# Patient Record
Sex: Female | Born: 1950 | Race: White | Hispanic: No | Marital: Married | Smoking: Former smoker
Health system: Southern US, Community
[De-identification: ages and names within clinical notes are randomized; demographics above are authoritative.]

## PROBLEM LIST (undated history)

## (undated) DIAGNOSIS — D496 Neoplasm of unspecified behavior of brain: Secondary | ICD-10-CM

## (undated) DIAGNOSIS — E079 Disorder of thyroid, unspecified: Secondary | ICD-10-CM

## (undated) DIAGNOSIS — D649 Anemia, unspecified: Secondary | ICD-10-CM

## (undated) DIAGNOSIS — F909 Attention-deficit hyperactivity disorder, unspecified type: Secondary | ICD-10-CM

## (undated) DIAGNOSIS — Z91018 Allergy to other foods: Secondary | ICD-10-CM

## (undated) DIAGNOSIS — E039 Hypothyroidism, unspecified: Secondary | ICD-10-CM

## (undated) DIAGNOSIS — K829 Disease of gallbladder, unspecified: Secondary | ICD-10-CM

## (undated) DIAGNOSIS — N62 Hypertrophy of breast: Secondary | ICD-10-CM

## (undated) DIAGNOSIS — Z98811 Dental restoration status: Secondary | ICD-10-CM

## (undated) DIAGNOSIS — M069 Rheumatoid arthritis, unspecified: Secondary | ICD-10-CM

## (undated) DIAGNOSIS — T7840XA Allergy, unspecified, initial encounter: Secondary | ICD-10-CM

## (undated) DIAGNOSIS — F32A Depression, unspecified: Secondary | ICD-10-CM

## (undated) DIAGNOSIS — E559 Vitamin D deficiency, unspecified: Secondary | ICD-10-CM

## (undated) HISTORY — DX: Allergy to other foods: Z91.018

## (undated) HISTORY — DX: Disease of gallbladder, unspecified: K82.9

## (undated) HISTORY — DX: Attention-deficit hyperactivity disorder, unspecified type: F90.9

## (undated) HISTORY — PX: FOOT SURGERY: SHX648

## (undated) HISTORY — PX: APPENDECTOMY: SHX54

## (undated) HISTORY — DX: Neoplasm of unspecified behavior of brain: D49.6

## (undated) HISTORY — DX: Allergy, unspecified, initial encounter: T78.40XA

## (undated) HISTORY — DX: Vitamin D deficiency, unspecified: E55.9

## (undated) HISTORY — PX: ABDOMINAL HYSTERECTOMY: SHX81

## (undated) HISTORY — DX: Anemia, unspecified: D64.9

## (undated) HISTORY — PX: TONSILLECTOMY: SUR1361

## (undated) HISTORY — DX: Disorder of thyroid, unspecified: E07.9

## (undated) HISTORY — PX: HERNIA REPAIR: SHX51

## (undated) HISTORY — DX: Depression, unspecified: F32.A

## (undated) HISTORY — PX: KNEE SURGERY: SHX244

---

## 1999-05-01 HISTORY — PX: ANTERIOR AND POSTERIOR REPAIR: SHX1172

## 2002-04-30 HISTORY — PX: BRAIN MENINGIOMA EXCISION: SHX576

## 2003-05-01 DIAGNOSIS — D496 Neoplasm of unspecified behavior of brain: Secondary | ICD-10-CM

## 2003-05-01 HISTORY — DX: Neoplasm of unspecified behavior of brain: D49.6

## 2003-11-29 ENCOUNTER — Encounter: Admission: RE | Admit: 2003-11-29 | Discharge: 2003-11-29 | Payer: Self-pay | Admitting: Family Medicine

## 2003-12-04 ENCOUNTER — Encounter: Admission: RE | Admit: 2003-12-04 | Discharge: 2003-12-04 | Payer: Self-pay | Admitting: Family Medicine

## 2003-12-06 ENCOUNTER — Encounter: Admission: RE | Admit: 2003-12-06 | Discharge: 2003-12-06 | Payer: Self-pay | Admitting: Family Medicine

## 2003-12-19 ENCOUNTER — Emergency Department (HOSPITAL_COMMUNITY): Admission: EM | Admit: 2003-12-19 | Discharge: 2003-12-19 | Payer: Self-pay | Admitting: Emergency Medicine

## 2004-05-16 ENCOUNTER — Other Ambulatory Visit: Admission: RE | Admit: 2004-05-16 | Discharge: 2004-05-16 | Payer: Self-pay | Admitting: Internal Medicine

## 2004-05-26 ENCOUNTER — Ambulatory Visit (HOSPITAL_COMMUNITY): Admission: RE | Admit: 2004-05-26 | Discharge: 2004-05-26 | Payer: Self-pay | Admitting: Internal Medicine

## 2005-06-27 ENCOUNTER — Ambulatory Visit (HOSPITAL_COMMUNITY): Admission: RE | Admit: 2005-06-27 | Discharge: 2005-06-27 | Payer: Self-pay | Admitting: Internal Medicine

## 2005-07-16 ENCOUNTER — Encounter: Admission: RE | Admit: 2005-07-16 | Discharge: 2005-07-16 | Payer: Self-pay | Admitting: Internal Medicine

## 2005-07-25 ENCOUNTER — Encounter: Admission: RE | Admit: 2005-07-25 | Discharge: 2005-07-25 | Payer: Self-pay | Admitting: Internal Medicine

## 2006-08-28 ENCOUNTER — Ambulatory Visit (HOSPITAL_COMMUNITY): Admission: RE | Admit: 2006-08-28 | Discharge: 2006-08-28 | Payer: Self-pay | Admitting: *Deleted

## 2007-07-16 ENCOUNTER — Encounter: Admission: RE | Admit: 2007-07-16 | Discharge: 2007-10-14 | Payer: Self-pay | Admitting: Family Medicine

## 2009-02-04 ENCOUNTER — Encounter: Admission: RE | Admit: 2009-02-04 | Discharge: 2009-02-04 | Payer: Self-pay | Admitting: *Deleted

## 2009-09-16 ENCOUNTER — Encounter: Admission: RE | Admit: 2009-09-16 | Discharge: 2009-09-16 | Payer: Self-pay | Admitting: *Deleted

## 2010-03-24 ENCOUNTER — Encounter: Admission: RE | Admit: 2010-03-24 | Discharge: 2010-03-24 | Payer: Self-pay | Admitting: *Deleted

## 2010-05-21 ENCOUNTER — Encounter: Payer: Self-pay | Admitting: Internal Medicine

## 2010-11-02 ENCOUNTER — Ambulatory Visit: Payer: Self-pay

## 2010-11-02 ENCOUNTER — Other Ambulatory Visit: Payer: Self-pay | Admitting: Occupational Medicine

## 2010-11-02 DIAGNOSIS — R52 Pain, unspecified: Secondary | ICD-10-CM

## 2011-02-06 ENCOUNTER — Other Ambulatory Visit: Payer: Self-pay | Admitting: Family Medicine

## 2011-02-06 DIAGNOSIS — N63 Unspecified lump in unspecified breast: Secondary | ICD-10-CM

## 2011-03-27 ENCOUNTER — Ambulatory Visit
Admission: RE | Admit: 2011-03-27 | Discharge: 2011-03-27 | Disposition: A | Discharge status: Home / Self Care | Payer: Private Health Insurance - Indemnity | Source: Ambulatory Visit | Attending: Family Medicine | Admitting: Family Medicine

## 2011-03-27 ENCOUNTER — Other Ambulatory Visit: Payer: Self-pay | Admitting: Family Medicine

## 2011-03-27 DIAGNOSIS — N63 Unspecified lump in unspecified breast: Secondary | ICD-10-CM

## 2011-05-12 ENCOUNTER — Emergency Department (INDEPENDENT_AMBULATORY_CARE_PROVIDER_SITE_OTHER): Payer: Private Health Insurance - Indemnity

## 2011-05-12 ENCOUNTER — Encounter (HOSPITAL_BASED_OUTPATIENT_CLINIC_OR_DEPARTMENT_OTHER): Payer: Self-pay | Admitting: *Deleted

## 2011-05-12 ENCOUNTER — Emergency Department (HOSPITAL_BASED_OUTPATIENT_CLINIC_OR_DEPARTMENT_OTHER)
Admission: EM | Admit: 2011-05-12 | Discharge: 2011-05-12 | Disposition: A | Discharge status: Home / Self Care | Payer: Private Health Insurance - Indemnity | Attending: Emergency Medicine | Admitting: Emergency Medicine

## 2011-05-12 DIAGNOSIS — M25559 Pain in unspecified hip: Secondary | ICD-10-CM

## 2011-05-12 DIAGNOSIS — W19XXXA Unspecified fall, initial encounter: Secondary | ICD-10-CM

## 2011-05-12 DIAGNOSIS — Z79899 Other long term (current) drug therapy: Secondary | ICD-10-CM | POA: Insufficient documentation

## 2011-05-12 DIAGNOSIS — S79929A Unspecified injury of unspecified thigh, initial encounter: Secondary | ICD-10-CM

## 2011-05-12 DIAGNOSIS — S7000XA Contusion of unspecified hip, initial encounter: Secondary | ICD-10-CM

## 2011-05-12 DIAGNOSIS — Y9229 Other specified public building as the place of occurrence of the external cause: Secondary | ICD-10-CM | POA: Insufficient documentation

## 2011-05-12 HISTORY — DX: Rheumatoid arthritis, unspecified: M06.9

## 2011-05-12 MED ORDER — HYDROCODONE-ACETAMINOPHEN 5-325 MG PO TABS: 2.0000 | ORAL_TABLET | ORAL | Status: AC | PRN

## 2011-05-12 NOTE — ED Provider Notes (Signed)
History  This chart was scribed for Doug Sou, MD by Bennett Scrape. This patient was seen in room MH07/MH07 and the patient's care was started at 8:16PM.  CSN: [id number]  Arrival date & time 05/12/11  7829   First MD Initiated Contact with Patient 05/12/11 2008      Chief Complaint  Patient presents with  . Hip Pain     Patient is a 61 y.o. female presenting with fall. The history is provided by the patient. No language interpreter was used.  Fall The accident occurred 6 to 12 hours ago. The fall occurred while walking. She landed on a hard floor. There was no blood loss. The point of impact was the left hip. The pain is present in the left hip. She was ambulatory at the scene. There was no entrapment after the fall. There was no alcohol use involved in the accident. Pertinent negatives include no fever, no nausea, no vomiting and no loss of consciousness. The symptoms are aggravated by activity.    Deborah Petersen is a 61 y.o. female who presents to the Emergency Department complaining of a slip and fall while she was walking in a [employer] around 11 o'clock this morning. She states that her right leg went under her and she fell onto left hip. She denies a head injury or LOC. She was ambulatory after the event. She c/o swelling and pain of the left hip and right wrist pain. She states that movement aggravates symptoms. She did not take any medications to improve symptoms. She states that she had a brain tumor removed in 2005 but reports that her neither walking or coordination have not been affected by it. She denies smoking and alcohol use.  Past Medical History  Diagnosis Date  . Rheumatoid arthritis   inactive for several years  Past Surgical History  Procedure Date  . Abdominal hysterectomy   . Back surgery   . Appendectomy   . Tonsillectomy   . Knee surgery   . Foot surgery     History reviewed. No pertinent family history.  History  Substance Use Topics  .  Smoking status: Never Smoker   . Smokeless tobacco: Not on file  . Alcohol Use: No    OB History    Grav Para Term Preterm Abortions TAB SAB Ect Mult Living                  Review of Systems  Constitutional: Negative for fever and chills.  HENT: Negative for congestion and sore throat.   Respiratory: Negative for cough and shortness of breath.   Cardiovascular: Negative for chest pain.  Gastrointestinal: Negative for nausea, vomiting and diarrhea.  Musculoskeletal: Positive for joint swelling (Left hip swelling and pain).  Skin: Negative for rash.  Neurological: Negative for loss of consciousness.  All other systems reviewed and are negative.    Allergies  Erythromycin  Home Medications   Current Outpatient Rx  Name Route Sig Dispense Refill  . ASPIRIN 81 MG PO TABS Oral Take 160 mg by mouth daily.    Marland Kitchen BILBERRY (VACCINIUM MYRTILLUS) PO Oral Take 1 capsule by mouth daily.    Marland Kitchen VITAMIN D-3 5000 UNITS PO TABS Oral Take 1 tablet by mouth daily.    . COD LIVER OIL PO Oral Take 1 capsule by mouth daily.    Marland Kitchen LEVOTHYROXINE SODIUM 112 MCG PO TABS Oral Take 112 mcg by mouth daily.    . ADULT MULTIVITAMIN W/MINERALS CH Oral Take 1  tablet by mouth daily.      Triage Vitals: BP 148/100  Pulse 98  Temp(Src) 98.4 F (36.9 C) (Oral)  Resp 20  Ht 5\' 3"  (1.6 m)  Wt 171 lb (77.565 kg)  BMI 30.29 kg/m2  SpO2 98%  Physical Exam  Nursing note and vitals reviewed. Constitutional: She is oriented to person, place, and time. She appears well-developed and well-nourished.  HENT:  Head: Normocephalic and atraumatic.  Eyes: Conjunctivae and EOM are normal.  Neck: Normal range of motion. Neck supple.  Cardiovascular: Normal rate, regular rhythm and normal heart sounds.   Pulmonary/Chest: Effort normal. No respiratory distress.  Abdominal: She exhibits no distension. There is no tenderness.  Musculoskeletal: Normal range of motion. She exhibits no edema.       Spine is non-tender,  ecchymotic area to the lateral aspect of the left hip; no pain upon internal rotation of the left thigh; ecchymotic area on the dorsal aspect of the left elbow, neurovascularly intact, right wrist is minimally tender; no swelling, no snuff box tenderness; limp favoring the left leg  Neurological: She is alert and oriented to person, place, and time. No cranial nerve deficit.  Skin: Skin is warm and dry.  Psychiatric: She has a normal mood and affect. Her behavior is normal.    ED Course  Procedures (including critical care time)  DIAGNOSTIC STUDIES: Oxygen Saturation is 98% on room air, normal by my interpretation.    COORDINATION OF CARE: 8:22PM-Discussed treatment plan with pt at bedside and pt agreed to plan. Pt turned down pain medications.  Labs Reviewed - No data to display  Dg Hip Complete Left  05/12/2011  *RADIOLOGY REPORT*  Clinical Data: Trauma, pain and bruise upper left thigh  LEFT HIP - COMPLETE 2+ VIEW  Comparison: None  Findings: Symmetric hip and SI joints. Question osseous demineralization. No acute fracture, dislocation, or bone destruction. Numerous pelvic phleboliths. Extensive soft tissue swelling/hematoma lateral aspect left hip region.  IMPRESSION: No acute osseous abnormalities.  Original Report Authenticated By: Lollie Marrow, M.D.   Ct Hip Left Wo Contrast  05/12/2011  *RADIOLOGY REPORT*  Clinical Data: Left hip pain post fall, hematoma  CT OF THE LEFT HIP WITHOUT CONTRAST  Technique:  Multidetector CT imaging was performed according to the standard protocol. Multiplanar CT image reconstructions were also generated.  Comparison: Radiographs 05/12/2011  Findings: Large soft tissue hematoma lateral to left hip and proximal left femur. Osseous mineralization normal. Left hip and SI joints normal appearance. No left pelvic or proximal left femoral fracture identified.  IMPRESSION: No fractures identified.  Original Report Authenticated By: Lollie Marrow, M.D.     No  diagnosis found.  Pt repeatedly declines pain medications  MDM  X-rays of right wrist and left elbow not indicated discussed with patient who agrees Plan prescription hydrocodone-A. Pap Followup with primary care Dr. at [facility] and that if significant pain in for 5 days Diagnosis #1 fall #2 contusions to multiple sites   Medical screening examination/treatment/procedure(s) were performed by non-physician practitioner and as supervising physician I was immediately available for consultation/collaboration.     Doug Sou, MD 05/12/11 2159

## 2011-05-12 NOTE — ED Provider Notes (Signed)
History     CSN: [id number]  Arrival date & time 05/12/11  0865   First MD Initiated Contact with Patient 05/12/11 2008      Chief Complaint  Patient presents with  . Hip Pain    (Consider location/radiation/quality/duration/timing/severity/associated sxs/prior treatment) HPI  Past Medical History  Diagnosis Date  . Rheumatoid arthritis     Past Surgical History  Procedure Date  . Abdominal hysterectomy   . Back surgery   . Appendectomy   . Tonsillectomy   . Knee surgery   . Foot surgery     History reviewed. No pertinent family history.  History  Substance Use Topics  . Smoking status: Never Smoker   . Smokeless tobacco: Not on file  . Alcohol Use: No    OB History    Grav Para Term Preterm Abortions TAB SAB Ect Mult Living                  Review of Systems  Allergies  Erythromycin  Home Medications   Current Outpatient Rx  Name Route Sig Dispense Refill  . ASPIRIN 81 MG PO TABS Oral Take 160 mg by mouth daily.    Marland Kitchen BILBERRY (VACCINIUM MYRTILLUS) PO Oral Take 1 capsule by mouth daily.    Marland Kitchen VITAMIN D-3 5000 UNITS PO TABS Oral Take 1 tablet by mouth daily.    . COD LIVER OIL PO Oral Take 1 capsule by mouth daily.    Marland Kitchen LEVOTHYROXINE SODIUM 112 MCG PO TABS Oral Take 112 mcg by mouth daily.    . ADULT MULTIVITAMIN W/MINERALS CH Oral Take 1 tablet by mouth daily.      BP 148/100  Pulse 98  Temp(Src) 98.4 F (36.9 C) (Oral)  Resp 20  Ht 5\' 3"  (1.6 m)  Wt 171 lb (77.565 kg)  BMI 30.29 kg/m2  SpO2 98%  Physical Exam  ED Course  Procedures (including critical care time)  Labs Reviewed - No data to display No results found.   No diagnosis found.    MDM  Duplicate note        Doug Sou, MD 05/13/11 [phone]

## 2011-05-12 NOTE — ED Notes (Signed)
Pt states she fell at [employer] this a.m. And injured her left hip. Now c/o pain and swelling to same.

## 2011-10-29 DIAGNOSIS — N62 Hypertrophy of breast: Secondary | ICD-10-CM | POA: Insufficient documentation

## 2011-10-29 HISTORY — DX: Hypertrophy of breast: N62

## 2011-11-12 ENCOUNTER — Encounter (HOSPITAL_BASED_OUTPATIENT_CLINIC_OR_DEPARTMENT_OTHER): Payer: Self-pay | Admitting: *Deleted

## 2011-11-19 ENCOUNTER — Encounter (HOSPITAL_BASED_OUTPATIENT_CLINIC_OR_DEPARTMENT_OTHER)
Admission: RE | Disposition: A | Discharge status: Home / Self Care | Payer: Self-pay | Source: Ambulatory Visit | Attending: Specialist

## 2011-11-19 ENCOUNTER — Encounter (HOSPITAL_BASED_OUTPATIENT_CLINIC_OR_DEPARTMENT_OTHER): Payer: Self-pay | Admitting: Anesthesiology

## 2011-11-19 ENCOUNTER — Ambulatory Visit (HOSPITAL_BASED_OUTPATIENT_CLINIC_OR_DEPARTMENT_OTHER): Payer: Private Health Insurance - Indemnity | Admitting: Anesthesiology

## 2011-11-19 ENCOUNTER — Encounter (HOSPITAL_BASED_OUTPATIENT_CLINIC_OR_DEPARTMENT_OTHER): Payer: Self-pay | Admitting: *Deleted

## 2011-11-19 ENCOUNTER — Ambulatory Visit (HOSPITAL_BASED_OUTPATIENT_CLINIC_OR_DEPARTMENT_OTHER)
Admission: RE | Admit: 2011-11-19 | Discharge: 2011-11-20 | Disposition: A | Discharge status: Home / Self Care | Payer: Private Health Insurance - Indemnity | Source: Ambulatory Visit | Attending: Specialist | Admitting: Specialist

## 2011-11-19 DIAGNOSIS — M549 Dorsalgia, unspecified: Secondary | ICD-10-CM | POA: Insufficient documentation

## 2011-11-19 DIAGNOSIS — M25519 Pain in unspecified shoulder: Secondary | ICD-10-CM | POA: Insufficient documentation

## 2011-11-19 DIAGNOSIS — N62 Hypertrophy of breast: Secondary | ICD-10-CM | POA: Insufficient documentation

## 2011-11-19 HISTORY — DX: Dental restoration status: Z98.811

## 2011-11-19 HISTORY — PX: BREAST REDUCTION SURGERY: SHX8

## 2011-11-19 HISTORY — DX: Hypertrophy of breast: N62

## 2011-11-19 HISTORY — DX: Hypothyroidism, unspecified: E03.9

## 2011-11-19 SURGERY — MAMMOPLASTY, REDUCTION
Anesthesia: General | Site: Breast | Laterality: Bilateral | Wound class: Clean

## 2011-11-19 MED ORDER — SODIUM CHLORIDE 0.9 % IV SOLN
INTRAVENOUS | Status: DC | PRN
  Administered 2011-11-19: 400 mL via INTRAMUSCULAR

## 2011-11-19 MED ORDER — CEFAZOLIN SODIUM-DEXTROSE 2-3 GM-% IV SOLR: 2.0000 g | INTRAVENOUS | Status: DC

## 2011-11-19 MED ORDER — OXYCODONE-ACETAMINOPHEN 5-325 MG PO TABS: 1.0000 | ORAL_TABLET | ORAL | Status: DC | PRN

## 2011-11-19 MED ORDER — OXYCODONE HCL 5 MG/5ML PO SOLN: 5.0000 mg | Freq: Once | ORAL | Status: AC | PRN

## 2011-11-19 MED ORDER — DIPHENHYDRAMINE HCL 50 MG/ML IJ SOLN: 12.5000 mg | Freq: Four times a day (QID) | INTRAMUSCULAR | Status: DC | PRN

## 2011-11-19 MED ORDER — ONDANSETRON HCL 4 MG/2ML IJ SOLN: 4.0000 mg | Freq: Four times a day (QID) | INTRAMUSCULAR | Status: DC | PRN

## 2011-11-19 MED ORDER — EPHEDRINE SULFATE 50 MG/ML IJ SOLN
INTRAMUSCULAR | Status: DC | PRN
  Administered 2011-11-19: 10 mg via INTRAVENOUS

## 2011-11-19 MED ORDER — DROPERIDOL 2.5 MG/ML IJ SOLN
INTRAMUSCULAR | Status: DC | PRN
  Administered 2011-11-19: 0.625 mg via INTRAVENOUS

## 2011-11-19 MED ORDER — LACTATED RINGERS IV SOLN: 125.0000 mL | INTRAVENOUS | Status: AC

## 2011-11-19 MED ORDER — DIPHENHYDRAMINE HCL 12.5 MG/5ML PO ELIX: 12.5000 mg | ORAL_SOLUTION | Freq: Four times a day (QID) | ORAL | Status: DC | PRN

## 2011-11-19 MED ORDER — MORPHINE SULFATE (PF) 1 MG/ML IV SOLN: INTRAVENOUS | Status: DC

## 2011-11-19 MED ORDER — MORPHINE SULFATE (PF) 1 MG/ML IV SOLN
INTRAVENOUS | Status: DC
Start: 2011-11-19 — End: 2011-11-20

## 2011-11-19 MED ORDER — SODIUM CHLORIDE 0.9 % IJ SOLN: 9.0000 mL | INTRAMUSCULAR | Status: DC | PRN

## 2011-11-19 MED ORDER — SUCCINYLCHOLINE CHLORIDE 20 MG/ML IJ SOLN
INTRAMUSCULAR | Status: DC | PRN
  Administered 2011-11-19: 100 mg via INTRAVENOUS

## 2011-11-19 MED ORDER — FENTANYL CITRATE 0.05 MG/ML IJ SOLN
INTRAMUSCULAR | Status: DC | PRN
  Administered 2011-11-19: 25 ug via INTRAVENOUS
  Administered 2011-11-19: 100 ug via INTRAVENOUS

## 2011-11-19 MED ORDER — OXYCODONE HCL 5 MG PO TABS: 5.0000 mg | ORAL_TABLET | Freq: Once | ORAL | Status: AC | PRN

## 2011-11-19 MED ORDER — ACETAMINOPHEN 10 MG/ML IV SOLN: 1000.0000 mg | Freq: Once | INTRAVENOUS | Status: DC

## 2011-11-19 MED ORDER — CEFAZOLIN SODIUM-DEXTROSE 2-3 GM-% IV SOLR
2.0000 g | INTRAVENOUS | Status: AC
  Administered 2011-11-19: 2 g via INTRAVENOUS

## 2011-11-19 MED ORDER — LACTATED RINGERS IV SOLN
INTRAVENOUS | Status: DC
  Administered 2011-11-19 (×3): via INTRAVENOUS

## 2011-11-19 MED ORDER — LEVOTHYROXINE SODIUM 112 MCG PO TABS: 112.0000 ug | ORAL_TABLET | Freq: Every day | ORAL | Status: DC

## 2011-11-19 MED ORDER — METOCLOPRAMIDE HCL 5 MG/ML IJ SOLN: 10.0000 mg | Freq: Once | INTRAMUSCULAR | Status: AC | PRN

## 2011-11-19 MED ORDER — PROPOFOL 10 MG/ML IV EMUL
INTRAVENOUS | Status: DC | PRN
  Administered 2011-11-19: 200 mg via INTRAVENOUS

## 2011-11-19 MED ORDER — HYDROMORPHONE HCL PF 1 MG/ML IJ SOLN: 0.2500 mg | INTRAMUSCULAR | Status: DC | PRN

## 2011-11-19 MED ORDER — OXYCODONE-ACETAMINOPHEN 2.5-325 MG PO TABS: 1.0000 | ORAL_TABLET | ORAL | Status: AC | PRN

## 2011-11-19 MED ORDER — LIDOCAINE-EPINEPHRINE 0.5 %-1:200000 IJ SOLN
INTRAMUSCULAR | Status: DC | PRN
  Administered 2011-11-19: 100 mL

## 2011-11-19 MED ORDER — NALOXONE HCL 0.4 MG/ML IJ SOLN: 0.4000 mg | INTRAMUSCULAR | Status: DC | PRN

## 2011-11-19 MED ORDER — DEXAMETHASONE SODIUM PHOSPHATE 4 MG/ML IJ SOLN
INTRAMUSCULAR | Status: DC | PRN
  Administered 2011-11-19: 10 mg via INTRAVENOUS

## 2011-11-19 MED ORDER — LIDOCAINE HCL (CARDIAC) 20 MG/ML IV SOLN
INTRAVENOUS | Status: DC | PRN
  Administered 2011-11-19: 100 mg via INTRAVENOUS

## 2011-11-19 MED ORDER — MIDAZOLAM HCL 5 MG/5ML IJ SOLN
INTRAMUSCULAR | Status: DC | PRN
  Administered 2011-11-19: 2 mg via INTRAVENOUS

## 2011-11-19 MED ORDER — LACTATED RINGERS IV SOLN
INTRAVENOUS | Status: DC
  Administered 2011-11-19: 12:00:00 via INTRAVENOUS

## 2011-11-19 MED ORDER — ONDANSETRON HCL 4 MG/2ML IJ SOLN
INTRAMUSCULAR | Status: DC | PRN
  Administered 2011-11-19: 4 mg via INTRAVENOUS

## 2011-11-19 MED ORDER — CEFAZOLIN SODIUM-DEXTROSE 2-3 GM-% IV SOLR
2.0000 g | Freq: Three times a day (TID) | INTRAVENOUS | Status: DC
  Administered 2011-11-19 – 2011-11-20 (×2): 2 g via INTRAVENOUS

## 2011-11-19 SURGICAL SUPPLY — 59 items
APL SKNCLS STERI-STRIP NONHPOA (GAUZE/BANDAGES/DRESSINGS) ×2
BAG DECANTER FOR FLEXI CONT (MISCELLANEOUS) ×2 IMPLANT
BENZOIN TINCTURE PRP APPL 2/3 (GAUZE/BANDAGES/DRESSINGS) ×4 IMPLANT
BLADE KNIFE  20 PERSONNA (BLADE) ×2
BLADE KNIFE 20 PERSONNA (BLADE) ×2 IMPLANT
BLADE KNIFE PERSONA 10 (BLADE) ×2 IMPLANT
BLADE KNIFE PERSONA 15 (BLADE) ×2 IMPLANT
CANISTER SUCTION 1200CC (MISCELLANEOUS) ×2 IMPLANT
CLOTH BEACON ORANGE TIMEOUT ST (SAFETY) ×2 IMPLANT
COVER MAYO STAND STRL (DRAPES) ×2 IMPLANT
COVER TABLE BACK 60X90 (DRAPES) ×2 IMPLANT
DECANTER SPIKE VIAL GLASS SM (MISCELLANEOUS) ×4 IMPLANT
DRAIN CHANNEL 10F 3/8 F FF (DRAIN) ×4 IMPLANT
DRAPE LAPAROSCOPIC ABDOMINAL (DRAPES) ×2 IMPLANT
DRAPE UTILITY XL STRL (DRAPES) ×2 IMPLANT
DRSG PAD ABDOMINAL 8X10 ST (GAUZE/BANDAGES/DRESSINGS) ×8 IMPLANT
ELECT REM PT RETURN 9FT ADLT (ELECTROSURGICAL) ×2
ELECTRODE REM PT RTRN 9FT ADLT (ELECTROSURGICAL) ×1 IMPLANT
EVACUATOR SILICONE 100CC (DRAIN) ×4 IMPLANT
FILTER 7/8 IN (FILTER) IMPLANT
GAUZE XEROFORM 5X9 LF (GAUZE/BANDAGES/DRESSINGS) ×4 IMPLANT
GLOVE BIO SURGEON STRL SZ 6.5 (GLOVE) ×2 IMPLANT
GLOVE BIOGEL M STRL SZ7.5 (GLOVE) ×2 IMPLANT
GLOVE BIOGEL PI IND STRL 8 (GLOVE) ×1 IMPLANT
GLOVE BIOGEL PI INDICATOR 8 (GLOVE) ×1
GLOVE ECLIPSE 7.0 STRL STRAW (GLOVE) ×2 IMPLANT
GOWN PREVENTION PLUS XXLARGE (GOWN DISPOSABLE) ×4 IMPLANT
IV NS 500ML (IV SOLUTION) ×2
IV NS 500ML BAXH (IV SOLUTION) ×1 IMPLANT
NDL SPNL 18GX3.5 QUINCKE PK (NEEDLE) ×1 IMPLANT
NEEDLE SPNL 18GX3.5 QUINCKE PK (NEEDLE) ×2 IMPLANT
NS IRRIG 1000ML POUR BTL (IV SOLUTION) IMPLANT
PACK BASIN DAY SURGERY FS (CUSTOM PROCEDURE TRAY) ×2 IMPLANT
PEN SKIN MARKING BROAD TIP (MISCELLANEOUS) ×2 IMPLANT
PILLOW FOAM RUBBER ADULT (PILLOWS) ×2 IMPLANT
PIN SAFETY STERILE (MISCELLANEOUS) ×2 IMPLANT
SHEETING SILICONE GEL EPI DERM (MISCELLANEOUS) IMPLANT
SLEEVE SCD COMPRESS KNEE MED (MISCELLANEOUS) ×2 IMPLANT
SPECIMEN JAR MEDIUM (MISCELLANEOUS) ×4 IMPLANT
SPECIMEN JAR X LARGE (MISCELLANEOUS) ×2 IMPLANT
SPONGE GAUZE 4X4 12PLY (GAUZE/BANDAGES/DRESSINGS) ×4 IMPLANT
SPONGE LAP 18X18 X RAY DECT (DISPOSABLE) ×8 IMPLANT
STRIP SUTURE WOUND CLOSURE 1/2 (SUTURE) ×10 IMPLANT
SUT MNCRL AB 3-0 PS2 18 (SUTURE) ×14 IMPLANT
SUT MON AB 2-0 CT1 36 (SUTURE) IMPLANT
SUT MON AB 5-0 PS2 18 (SUTURE) ×4 IMPLANT
SUT PROLENE 3 0 PS 2 (SUTURE) ×13 IMPLANT
SYR 50ML LL SCALE MARK (SYRINGE) ×4 IMPLANT
SYR CONTROL 10ML LL (SYRINGE) IMPLANT
TAPE HYPAFIX 6X30 (GAUZE/BANDAGES/DRESSINGS) ×2 IMPLANT
TAPE MEASURE 72IN RETRACT (INSTRUMENTS)
TAPE MEASURE LINEN 72IN RETRCT (INSTRUMENTS) IMPLANT
TAPE PAPER MEDFIX 1IN X 10YD (GAUZE/BANDAGES/DRESSINGS) ×2 IMPLANT
TOWEL OR NON WOVEN STRL DISP B (DISPOSABLE) ×2 IMPLANT
TUBE CONNECTING 20X1/4 (TUBING) ×2 IMPLANT
UNDERPAD 30X30 INCONTINENT (UNDERPADS AND DIAPERS) ×6 IMPLANT
VAC PENCILS W/TUBING CLEAR (MISCELLANEOUS) ×2 IMPLANT
WATER STERILE IRR 1000ML POUR (IV SOLUTION) ×2 IMPLANT
YANKAUER SUCT BULB TIP NO VENT (SUCTIONS) ×2 IMPLANT

## 2011-11-19 NOTE — H&P (Signed)
Deborah Petersen is an 61 y.o. female.   Chief Complaint: Increased macromastia HPI: Increased back and shouldre pain intertrigo kyphosis  Past Medical History  Diagnosis Date  . Rheumatoid arthritis     no current problems or meds.  . Hypothyroidism   . Macromastia 10/2011  . Dental crowns present     Past Surgical History  Procedure Date  . Appendectomy   . Tonsillectomy   . Knee surgery   . Foot surgery   . Abdominal hysterectomy     partial  . Brain meningioma excision 2004    History reviewed. No pertinent family history. Social History:  reports that she has never smoked. She has never used smokeless tobacco. She reports that she does not drink alcohol or use illicit drugs.  Allergies:  Allergies  Allergen Reactions  . Erythromycin Nausea And Vomiting    Medications Prior to Admission  Medication Sig Dispense Refill  . aspirin 81 MG tablet Take 160 mg by mouth daily.      Marland Kitchen BILBERRY, VACCINIUM MYRTILLUS, PO Take 1 capsule by mouth daily.      . Cholecalciferol (VITAMIN D-3) 5000 UNITS TABS Take 1 tablet by mouth daily.      . COD LIVER OIL PO Take 1 capsule by mouth daily.      Marland Kitchen levothyroxine (SYNTHROID, LEVOTHROID) 112 MCG tablet Take 112 mcg by mouth daily. AM      . Multiple Vitamin (MULITIVITAMIN WITH MINERALS) TABS Take 1 tablet by mouth daily.        Results for orders placed during the hospital encounter of 11/19/11 (from the past 48 hour(s))  POCT HEMOGLOBIN-HEMACUE     Status: Normal   Collection Time   11/19/11  6:37 AM      Component Value Range Comment   Hemoglobin 12.3  12.0 - 15.0 g/dL    No results found.  Review of Systems  Constitutional: Negative.   HENT: Negative.   Eyes: Negative.   Respiratory: Negative.   Cardiovascular: Negative.   Gastrointestinal: Negative.   Genitourinary: Negative.   Musculoskeletal: Negative.   Skin: Positive for rash.  Neurological: Negative.   Endo/Heme/Allergies: Negative.   Psychiatric/Behavioral:  Negative.     Blood pressure 125/83, pulse 69, temperature 98.2 F (36.8 C), temperature source Oral, resp. rate 16, height 5\' 3"  (1.6 m), weight 77.111 kg (170 lb), SpO2 96.00%. Physical Exam   Assessment/Plan Bilateral breast reducions for treatment of severe macromastia  Tyrina Hines L 11/19/2011, 7:53 AM

## 2011-11-19 NOTE — Transfer of Care (Signed)
Immediate Anesthesia Transfer of Care Note  Patient: Deborah Petersen  Procedure(s) Performed: Procedure(s) (LRB): MAMMARY REDUCTION  (BREAST) (Bilateral)  Patient Location: PACU  Anesthesia Type: General  Level of Consciousness: awake  Airway & Oxygen Therapy: Patient Spontanous Breathing and Patient connected to face mask oxygen  Post-op Assessment: Report given to PACU RN and Post -op Vital signs reviewed and stable  Post vital signs: Reviewed and stable  Complications: No apparent anesthesia complications

## 2011-11-19 NOTE — Brief Op Note (Signed)
11/19/2011  10:23 AM  PATIENT:  Deborah Petersen  61 y.o. female  PRE-OPERATIVE DIAGNOSIS:  bilateral breast reduction  POST-OPERATIVE DIAGNOSIS:  bilateral breast reduction  PROCEDURE:  Procedure(s) (LRB): MAMMARY REDUCTION  (BREAST) (Bilateral)  SURGEON:  Surgeon(s) and Role:    * Louisa Second, MD - Primary  PHYSICIAN ASSISTANT:   ASSISTANTS: none   ANESTHESIA:   general  EBL:  Total I/O In: 2000 [I.V.:2000] Out: -   BLOOD ADMINISTERED:none  DRAINS: (right and left chest areas) Jackson-Pratt drain(s) with closed bulb suction in the    LOCAL MEDICATIONS USED:  LIDOCAINE   SPECIMEN:  Excision  DISPOSITION OF SPECIMEN:  PATHOLOGY  COUNTS:  YES  TOURNIQUET:  * No tourniquets in log *  DICTATION: .Other Dictation: Dictation Number [id number]  PLAN OF CARE: Admit for overnight observation  PATIENT DISPOSITION:  PACU - hemodynamically stable.   Delay start of Pharmacological VTE agent (>24hrs) due to surgical blood loss or risk of bleeding: yes

## 2011-11-19 NOTE — Brief Op Note (Signed)
11/19/2011  10:17 AM  PATIENT:  Deborah Petersen  61 y.o. female  PRE-OPERATIVE DIAGNOSIS:  bilateral breast reduction  POST-OPERATIVE DIAGNOSIS:  bilateral breast reduction  PROCEDURE:  Procedure(s) (LRB): MAMMARY REDUCTION  (BREAST) (Bilateral)  SURGEON:  Surgeon(s) and Role:    * Louisa Second, MD - Primary  PHYSICIAN ASSISTANT:   ASSISTANTS: none   ANESTHESIA:   general  EBL:  Total I/O In: 2000 [I.V.:2000] Out: -   BLOOD ADMINISTERED:none  DRAINS: (right and left  lateral chest areas) Jackson-Pratt drain(s) with closed bulb suction in the    LOCAL MEDICATIONS USED:  LIDOCAINE   SPECIMEN:  Excision  DISPOSITION OF SPECIMEN:  PATHOLOGY  COUNTS:  YES  TOURNIQUET:  * No tourniquets in log *  DICTATION: .Other Dictation: Dictation Number [id number]  PLAN OF CARE: Admit for overnight observation  PATIENT DISPOSITION:  PACU - hemodynamically stable.   Delay start of Pharmacological VTE agent (>24hrs) due to surgical blood loss or risk of bleeding: yes

## 2011-11-19 NOTE — Anesthesia Preprocedure Evaluation (Signed)
Anesthesia Evaluation  Patient identified by MRN, date of birth, ID band Patient awake    Reviewed: Allergy & Precautions, H&P , NPO status , Patient's Chart, lab work & pertinent test results, reviewed documented beta blocker date and time   Airway Mallampati: II TM Distance: >3 FB Neck ROM: full    Dental   Pulmonary neg pulmonary ROS,  breath sounds clear to auscultation        Cardiovascular negative cardio ROS  Rhythm:regular Rate:Normal     Neuro/Psych negative neurological ROS  negative psych ROS   GI/Hepatic negative GI ROS, Neg liver ROS,   Endo/Other  Hypothyroidism   Renal/GU negative Renal ROS  negative genitourinary   Musculoskeletal  (+) Arthritis -,   Abdominal   Peds  Hematology negative hematology ROS (+)   Anesthesia Other Findings See surgeon's H&P   Reproductive/Obstetrics negative OB ROS                           Anesthesia Physical Anesthesia Plan  ASA: II  Anesthesia Plan: General   Post-op Pain Management:    Induction: Intravenous  Airway Management Planned: Oral ETT  Additional Equipment:   Intra-op Plan:   Post-operative Plan: Extubation in OR  Informed Consent: I have reviewed the patients History and Physical, chart, labs and discussed the procedure including the risks, benefits and alternatives for the proposed anesthesia with the patient or authorized representative who has indicated his/her understanding and acceptance.   Dental Advisory Given  Plan Discussed with: CRNA and Surgeon  Anesthesia Plan Comments:         Anesthesia Quick Evaluation

## 2011-11-19 NOTE — Anesthesia Postprocedure Evaluation (Signed)
Anesthesia Post Note  Patient: Deborah Petersen  Procedure(s) Performed: Procedure(s) (LRB): MAMMARY REDUCTION  (BREAST) (Bilateral)  Anesthesia type: General  Patient location: PACU  Post pain: Pain level controlled  Post assessment: Patient's Cardiovascular Status Stable  Last Vitals:  Filed Vitals:   11/19/11 1212  BP: 136/80  Pulse: 80  Temp:   Resp: 16    Post vital signs: Reviewed and stable  Level of consciousness: alert  Complications: No apparent anesthesia complications

## 2011-11-20 ENCOUNTER — Encounter (HOSPITAL_BASED_OUTPATIENT_CLINIC_OR_DEPARTMENT_OTHER): Payer: Self-pay | Admitting: Specialist

## 2011-11-20 NOTE — Op Note (Signed)
NAMEFREDDYE, CARDAMONE             ACCOUNT NO.:  [id number]  MEDICAL RECORD NO.:  [id number]  LOCATION:                                 FACILITY:  PHYSICIAN:  Earvin Hansen L. Shon Hough, M.D.   DATE OF BIRTH:  DATE OF PROCEDURE:  11/19/2011 DATE OF DISCHARGE:                              OPERATIVE REPORT   61 year old lady with severe macromastia, back, and shoulder pain secondary to large pendulous breasts.  History of intertriginous changes conservative treatment done, heat pads, ice packs with no avail, special bras have been worn with no avail.  PROCEDURES:  Bilateral breast reductions using the inferior pedicle technique.  ANESTHESIA:  General.  Preoperatively, the patient was sat up and drawn for the inferior pedicle reduction mammoplasty.  She was remarked from over 30 cm on the right and left sides to 22 from the nipple-areolar complex to the suprasternal notch.  She underwent general anesthesia and intubated orally.  Prep was done to the chest, breast areas in routine fashion using Hibiclens soap and solution, walled off with sterile towels and draped so as to make a sterile field.  0.25% Xylocaine with epinephrine was injected locally, total of 200 mL per side.  The wounds were scored with #15 blade, and the skin of the inferior pedicle was de- epithelialized with #20 blades.  Medial and lateral fatty dermal pedicles were incised down to underlying fascia.  Laterally, more accessory breast tissue was removed.  The new keyhole area was debulked and after proper hemostasis the flaps were transposed and stayed with 3- 0 Prolene.  Subcutaneous closure was done with 3-0 Monocryl x2 layers, a running subcuticular stitch of 3-0 Monocryl and 5-0 Monocryl throughout the inverted T.  ESTIMATED BLOOD LOSS:  200 mL.  COMPLICATIONS:  None.  The wounds were drained with a 10 fully fluted Blake drains which were placed in the depths of the wound.  The bladder brought out through  the lateral-most portion incision and secured with 3-0 Prolene.  Steri- Strips and soft dressing applied to all the areas Xeroform, 4x4s, ABDs, and Hypafix tape.  The nipple-areolar complex were examined at the end of procedure having excellent blood supply.  She was taken to recovery room in excellent condition.     Yaakov Guthrie. Shon Hough, M.D.    Cathie Hoops  D:  11/19/2011  T:  11/19/2011  Job:  [id number]

## 2012-05-26 ENCOUNTER — Other Ambulatory Visit: Payer: Self-pay | Admitting: Family Medicine

## 2012-05-26 DIAGNOSIS — Z1231 Encounter for screening mammogram for malignant neoplasm of breast: Secondary | ICD-10-CM

## 2012-06-17 ENCOUNTER — Ambulatory Visit
Admission: RE | Admit: 2012-06-17 | Discharge: 2012-06-17 | Disposition: A | Discharge status: Home / Self Care | Payer: Private Health Insurance - Indemnity | Source: Ambulatory Visit | Attending: Family Medicine | Admitting: Family Medicine

## 2012-06-17 DIAGNOSIS — Z1231 Encounter for screening mammogram for malignant neoplasm of breast: Secondary | ICD-10-CM

## 2012-06-18 ENCOUNTER — Other Ambulatory Visit: Payer: Self-pay | Admitting: Family Medicine

## 2012-06-18 DIAGNOSIS — N63 Unspecified lump in unspecified breast: Secondary | ICD-10-CM

## 2012-07-07 ENCOUNTER — Ambulatory Visit
Admission: RE | Admit: 2012-07-07 | Discharge: 2012-07-07 | Disposition: A | Discharge status: Home / Self Care | Payer: Managed Care, Other (non HMO) | Source: Ambulatory Visit | Attending: Family Medicine | Admitting: Family Medicine

## 2012-07-07 DIAGNOSIS — N63 Unspecified lump in unspecified breast: Secondary | ICD-10-CM

## 2013-01-30 ENCOUNTER — Encounter (HOSPITAL_COMMUNITY): Payer: Self-pay

## 2013-01-30 ENCOUNTER — Emergency Department (HOSPITAL_COMMUNITY): Payer: Managed Care, Other (non HMO)

## 2013-01-30 ENCOUNTER — Emergency Department (HOSPITAL_COMMUNITY)
Admission: EM | Admit: 2013-01-30 | Discharge: 2013-01-30 | Disposition: A | Discharge status: Home / Self Care | Payer: Managed Care, Other (non HMO) | Attending: Emergency Medicine | Admitting: Emergency Medicine

## 2013-01-30 DIAGNOSIS — Y92009 Unspecified place in unspecified non-institutional (private) residence as the place of occurrence of the external cause: Secondary | ICD-10-CM | POA: Insufficient documentation

## 2013-01-30 DIAGNOSIS — W1809XA Striking against other object with subsequent fall, initial encounter: Secondary | ICD-10-CM | POA: Insufficient documentation

## 2013-01-30 DIAGNOSIS — E039 Hypothyroidism, unspecified: Secondary | ICD-10-CM | POA: Insufficient documentation

## 2013-01-30 DIAGNOSIS — S0510XA Contusion of eyeball and orbital tissues, unspecified eye, initial encounter: Secondary | ICD-10-CM | POA: Insufficient documentation

## 2013-01-30 DIAGNOSIS — R112 Nausea with vomiting, unspecified: Secondary | ICD-10-CM | POA: Insufficient documentation

## 2013-01-30 DIAGNOSIS — Z79899 Other long term (current) drug therapy: Secondary | ICD-10-CM | POA: Insufficient documentation

## 2013-01-30 DIAGNOSIS — W19XXXA Unspecified fall, initial encounter: Secondary | ICD-10-CM

## 2013-01-30 DIAGNOSIS — Z8742 Personal history of other diseases of the female genital tract: Secondary | ICD-10-CM | POA: Insufficient documentation

## 2013-01-30 DIAGNOSIS — Z9884 Bariatric surgery status: Secondary | ICD-10-CM | POA: Insufficient documentation

## 2013-01-30 DIAGNOSIS — R252 Cramp and spasm: Secondary | ICD-10-CM | POA: Insufficient documentation

## 2013-01-30 DIAGNOSIS — Y9389 Activity, other specified: Secondary | ICD-10-CM | POA: Insufficient documentation

## 2013-01-30 DIAGNOSIS — S060X9A Concussion with loss of consciousness of unspecified duration, initial encounter: Secondary | ICD-10-CM | POA: Insufficient documentation

## 2013-01-30 DIAGNOSIS — R1110 Vomiting, unspecified: Secondary | ICD-10-CM

## 2013-01-30 LAB — MAGNESIUM: Magnesium: 1.9 mg/dL (ref 1.5–2.5)

## 2013-01-30 LAB — CBC WITH DIFFERENTIAL/PLATELET
Basophils Absolute: 0 10*3/uL (ref 0.0–0.1)
Eosinophils Absolute: 0.3 10*3/uL (ref 0.0–0.7)
Eosinophils Relative: 4 % (ref 0–5)
HCT: 37.5 % (ref 36.0–46.0)
MCH: 25.4 pg — ABNORMAL LOW (ref 26.0–34.0)
MCV: 77.3 fL — ABNORMAL LOW (ref 78.0–100.0)
Monocytes Absolute: 0.7 10*3/uL (ref 0.1–1.0)
Platelets: 263 10*3/uL (ref 150–400)
RDW: 14.5 % (ref 11.5–15.5)

## 2013-01-30 LAB — BASIC METABOLIC PANEL
CO2: 25 mEq/L (ref 19–32)
Calcium: 9 mg/dL (ref 8.4–10.5)
Creatinine, Ser: 0.98 mg/dL (ref 0.50–1.10)
GFR calc non Af Amer: 61 mL/min — ABNORMAL LOW (ref >90)
Glucose, Bld: 75 mg/dL (ref 70–99)
Sodium: 139 mEq/L (ref 135–145)

## 2013-01-30 LAB — URINE MICROSCOPIC-ADD ON

## 2013-01-30 LAB — URINALYSIS, ROUTINE W REFLEX MICROSCOPIC
Bilirubin Urine: NEGATIVE
Glucose, UA: NEGATIVE mg/dL
Ketones, ur: NEGATIVE mg/dL
Protein, ur: NEGATIVE mg/dL
pH: 7 (ref 5.0–8.0)

## 2013-01-30 NOTE — ED Provider Notes (Signed)
CSN: [id number]     Arrival date & time 01/30/13  9629 History   First MD Initiated Contact with Patient 01/30/13 [phone]     Chief Complaint  Patient presents with  . Claudication  . Emesis  . Fall   (Consider location/radiation/quality/duration/timing/severity/associated sxs/prior Treatment) HPI 62 year old female presents emergency department from home with complaint of nausea and vomiting tonight several times.  After this, she developed cramping of her legs.  When she attempted to get up and walk out the cramps, she fell, hitting her head.  There is a period of time, that she does not remember.  Patient has history of previous gastric bypass surgery, roux en y done 2002.  She reports that she often vomits at night.  No abdominal pain, no unusual foods, no diarrhea, no fevers.  No sick contacts.  Patient reports she normally has leg cramps.  Tonight leg cramps worse usual.  She is on blood thinners.  She denies any headache at this time.  No confusion.  Past Medical History  Diagnosis Date  . Rheumatoid arthritis(714.0)     no current problems or meds.  . Hypothyroidism   . Macromastia 10/2011  . Dental crowns present    Past Surgical History  Procedure Laterality Date  . Appendectomy    . Tonsillectomy    . Knee surgery    . Foot surgery    . Abdominal hysterectomy      partial  . Brain meningioma excision  2004  . Breast reduction surgery  11/19/2011    Procedure: MAMMARY REDUCTION  (BREAST);  Surgeon: Louisa Second, MD;  Location: [facility];  Service: Plastics;  Laterality: Bilateral;   No family history on file. History  Substance Use Topics  . Smoking status: Never Smoker   . Smokeless tobacco: Never Used  . Alcohol Use: No   OB History   Grav Para Term Preterm Abortions TAB SAB Ect Mult Living                 Review of Systems  All other systems reviewed and are negative.    Allergies  Erythromycin  Home Medications   Current Outpatient  Rx  Name  Route  Sig  Dispense  Refill  . Cholecalciferol (VITAMIN D-3) 5000 UNITS TABS   Oral   Take 1 tablet by mouth daily.         . COD LIVER OIL PO   Oral   Take 1 capsule by mouth daily.         Marland Kitchen levothyroxine (SYNTHROID, LEVOTHROID) 112 MCG tablet   Oral   Take 112 mcg by mouth daily. AM          BP 113/74  Pulse 74  Temp(Src) 98.1 F (36.7 C) (Oral)  Resp 13  Ht 5\' 3"  (1.6 m)  Wt 160 lb (72.576 kg)  BMI 28.35 kg/m2  SpO2 99% Physical Exam  Nursing note and vitals reviewed. Constitutional: She is oriented to person, place, and time. She appears well-developed and well-nourished.  HENT:  Head: Normocephalic and atraumatic.  Right Ear: External ear normal.  Left Ear: External ear normal.  Nose: Nose normal.  Mouth/Throat: Oropharynx is clear and moist.  Eyes: Conjunctivae and EOM are normal. Pupils are equal, round, and reactive to light.  Neck: Normal range of motion. Neck supple. No JVD present. No tracheal deviation present. No thyromegaly present.  Cardiovascular: Normal rate, regular rhythm, normal heart sounds and intact distal pulses.  Exam reveals no  gallop and no friction rub.   No murmur heard. Pulmonary/Chest: Effort normal and breath sounds normal. No stridor. No respiratory distress. She has no wheezes. She has no rales. She exhibits no tenderness.  Abdominal: Soft. Bowel sounds are normal. She exhibits no distension and no mass. There is no tenderness. There is no rebound and no guarding.  Musculoskeletal: Normal range of motion. She exhibits no edema and no tenderness.  Lymphadenopathy:    She has no cervical adenopathy.  Neurological: She is alert and oriented to person, place, and time. She has normal reflexes. No cranial nerve deficit. She exhibits normal muscle tone. Coordination normal.  Skin: Skin is dry. No rash noted. No erythema. There is pallor.  Psychiatric: She has a normal mood and affect. Her behavior is normal. Judgment and  thought content normal.    ED Course  Procedures (including critical care time) Labs Review Labs Reviewed  CBC WITH DIFFERENTIAL - Abnormal; Notable for the following:    MCV 77.3 (*)    MCH 25.4 (*)    All other components within normal limits  BASIC METABOLIC PANEL - Abnormal; Notable for the following:    GFR calc non Af Amer 61 (*)    GFR calc Af Amer 71 (*)    All other components within normal limits  URINALYSIS, ROUTINE W REFLEX MICROSCOPIC - Abnormal; Notable for the following:    Leukocytes, UA TRACE (*)    All other components within normal limits  URINE MICROSCOPIC-ADD ON - Abnormal; Notable for the following:    Casts HYALINE CASTS (*)    All other components within normal limits  MAGNESIUM   Imaging Review Ct Head Wo Contrast  01/30/2013   *RADIOLOGY REPORT*  Clinical Data: Fall with loss of consciousness  CT HEAD WITHOUT CONTRAST  Technique:  Contiguous axial images were obtained from the base of the skull through the vertex without contrast.  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  There is no acute intracranial hemorrhage or infarct.  No midline shift or mass lesion.  No extra-axial fluid collection.  CSF containing spaces are within normal limits.  Other than the craniotomy site, the calvarium is intact.  Scalp soft tissues are within normal limits.  Orbital of some are normal.  There is complete opacification of the right maxillary sinus. Otherwise, the paranasal sinuses and mastoid air cells are clear.  IMPRESSION:  1. No acute intracranial process.  Postoperative changes from prior rectosigmoid craniotomy with cranioplasty for resection of right cerebellopontine angle meningioma. 2.  Right maxillary sinusitis.   Original Report Authenticated By: Rise Mu, M.D.    MDM   1. Vomiting    2. Leg cramping   3. Fall at home, initial encounter   4. Periorbital contusion    61 year old female with vomiting, leg cramps, and head injury.  Tonight.  She no longer has nausea or vomiting, no longer had muscle cramps.  Given her report of LOC, will check head CT for possible intercranial injury.  We'll check baseline labs for possible hypokalemia causing her muscle cramps.    Olivia Mackie, MD 01/30/13 [phone]

## 2013-01-30 NOTE — ED Notes (Signed)
Patient presents to ED via POV. Pt states that she has vomited x6 tonight. Pt started having "leg cramps" while lying in bed, pt went to "walk off the cramps" and fell hitting her head on the floor. Pt unsure if she experience LOC. Upon arrival to ED pt A&Ox4. Neuro intact.

## 2013-04-16 ENCOUNTER — Encounter: Payer: Self-pay | Admitting: Obstetrics & Gynecology

## 2013-04-16 ENCOUNTER — Ambulatory Visit (INDEPENDENT_AMBULATORY_CARE_PROVIDER_SITE_OTHER): Payer: Managed Care, Other (non HMO) | Admitting: Obstetrics & Gynecology

## 2013-04-16 VITALS — BP 124/93 | HR 87 | Temp 97.9°F | Ht 61.0 in | Wt 161.3 lb

## 2013-04-16 DIAGNOSIS — N8111 Cystocele, midline: Secondary | ICD-10-CM

## 2013-04-16 DIAGNOSIS — N8110 Cystocele, unspecified: Secondary | ICD-10-CM

## 2013-04-16 DIAGNOSIS — N393 Stress incontinence (female) (male): Secondary | ICD-10-CM

## 2013-04-16 NOTE — Progress Notes (Signed)
Subjective:     Patient ID: Deborah Petersen, female   DOB: February 07, 1951, 62 y.o.   MRN: [id number]  HPI Pt reports a h/o pelvic pressure.  She reports leakage of urine 2-3x/week that she feels is 'manageable'.  She reports that she had these same sx in 2001 and underwent a A & P repair.  She reports that she does not feel that she is ready yet for surgery but, wanted to be evaluated.  Past Medical History  Diagnosis Date  . Rheumatoid arthritis(714.0)     no current problems or meds.  . Hypothyroidism   . Macromastia 10/2011  . Dental crowns present   . Brain tumor 2005   Past Surgical History  Procedure Laterality Date  . Appendectomy    . Tonsillectomy    . Knee surgery    . Foot surgery    . Abdominal hysterectomy      partial  . Brain meningioma excision  2004  . Breast reduction surgery  11/19/2011    Procedure: MAMMARY REDUCTION  (BREAST);  Surgeon: Louisa Second, MD;  Location: [facility];  Service: Plastics;  Laterality: Bilateral;  . Anterior and posterior repair  2001  . Hernia repair     Current Outpatient Prescriptions on File Prior to Visit  Medication Sig Dispense Refill  . aspirin EC 81 MG tablet Take 81 mg by mouth daily.      . Cholecalciferol (VITAMIN D-3) 5000 UNITS TABS Take 1 tablet by mouth daily.      . COD LIVER OIL PO Take 1 capsule by mouth daily.      Marland Kitchen levothyroxine (SYNTHROID, LEVOTHROID) 112 MCG tablet Take 100 mcg by mouth daily. AM      . lisdexamfetamine (VYVANSE) 60 MG capsule Take 60 mg by mouth every morning.       No current facility-administered medications on file prior to visit.   Allergies  Allergen Reactions  . Erythromycin Nausea And Vomiting   History   Social History  . Marital Status: Married    Spouse Name: N/A    Number of Children: N/A  . Years of Education: N/A   Occupational History  . Not on file.   Social History Main Topics  . Smoking status: Former Games developer  . Smokeless tobacco: Former Neurosurgeon   Quit date: 04/30/1976  . Alcohol Use: No  . Drug Use: No  . Sexual Activity: No   Other Topics Concern  . Not on file   Social History Narrative  . No narrative on file       Review of Systems     Objective:   Physical Exam BP 124/93  Pulse 87  Temp(Src) 97.9 F (36.6 C)  Ht 5\' 1"  (1.549 m)  Wt 161 lb 4.8 oz (73.165 kg)  BMI 30.49 kg/m2 Abd: soft, NT, ND GU: EGBUS: no lesions Vagina: no blood in vault; grade I- II cystocele  no leakage of urine with valsalva lying or standing          Assessment:     Grade I-II cystocele with urinary incontinence.  Pt does not wat to try pessary or surgery at present.  I gave her Dr. Ashley Royalty name in [place].  She wants to look her info up.  She would like ot f/u here until she decides that she wants more treatment.      Plan:     F/u in 3 months or sooner prn Referral to  Dr. Lavella Hammock in  [place] prn

## 2013-04-16 NOTE — Progress Notes (Signed)
States had A& P repair in 2001. States feel something falling out again, and was told surgery would last only about 10 years, so thinks needs another surgery.

## 2013-04-16 NOTE — Patient Instructions (Signed)
Urogynecologist at [facility]- Dr. Bea Graff  Urinary Incontinence Urinary incontinence is the involuntary loss of urine from your bladder. CAUSES  There are many causes of urinary incontinence. They include:  Medicines.  Infections.  Prostatic enlargement, leading to overflow of urine from your bladder.  Surgery.  Neurological diseases.  Emotional factors. SIGNS AND SYMPTOMS Urinary Incontinence can be divided into four types: 1. Urge incontinence Urge incontinence is the involuntary loss of urine before you have the opportunity to go to the bathroom. There is an sudden urge to void but not enough time to reach a bathroom. 2. Stress incontinence Stress incontinence is the sudden loss of urine with any activity that forces urine to pass. It is commonly caused by anatomical changes to the pelvis and sphincter areas of your body. 3. Overflow incontinence Overflow incontinence is the loss of urine from an obstructed opening to your bladder. This results in a backup of urine and a resultant buildup of pressure within the bladder. When the pressure within the bladder exceeds the closing pressure of the sphincter, the urine overflows, which causes incontinence, similar to water overflowing a dam. 4. Total incontinence Total incontinence is the loss of urine as a result of the inability to store urine within your bladder. DIAGNOSIS  Evaluating the cause of incontinence may require:  A thorough and complete medical and obstetric history.  A complete physical exam.  Laboratory tests such as a urine culture and sensitivities. When additional tests are indicated, they can include:  An ultrasound exam.  Kidney and bladder X-rays.  Cystoscopy. This is an exam of the bladder using a narrow scope.  Urodynamic testing to test the nerve function to the bladder and sphincter areas. TREATMENT  Treatment for urinary incontinence depends on the cause:  For urge incontinence caused  by a bacterial infection, antibiotics will be prescribed. If the urge incontinence is related to medicines you take, your health care provider may have you change the medicine.  For stress incontinence, surgery to re-establish anatomical support to the bladder or sphincter, or both, will often correct the condition.  For overflow incontinence caused by an enlarged prostate, an operation to open the channel through the enlarged prostate will allow the flow of urine out of the bladder. In women with fibroids, a hysterectomy may be recommended.  For total incontinence, surgery on your urinary sphincter may help. An artificial urinary sphincter (an inflatable cuff placed around the urethra) may be required. In women who have developed a hole-like passage between their bladder and vagina (vesicovaginal fistula), surgery to close the fistula often is required. HOME CARE INSTRUCTIONS  Normal daily hygiene and the use of pads or adult diapers that are changed regularly will help prevent odors and skin damage.  Avoid caffeine. It can overstimulate your bladder.  Use the bathroom regularly. Try about every 2 3 hours to go to the bathroom, even if you do not feel the need to do so. Take time to empty your bladder completely. After urinating, wait a minute. Then try to urinate again.  For causes involving nerve dysfunction, keep a log of the medicines you take and a journal of the times you go to the bathroom. SEEK MEDICAL CARE IF:  You experience worsening of pain instead of improvement in pain after your procedure.  Your incontinence becomes worse instead of better. SEE IMMEDIATE MEDICAL CARE IF:  You experience fever or shaking chills.  You are unable to pass your urine.  You have redness spreading  into your groin or down into your thighs. MAKE SURE YOU:   Understand these instructions.   Will watch your condition.  Will get help right away if you are not doing well or get worse. Document  Released: 05/24/2004 Document Revised: 12/17/2012 Document Reviewed: 09/23/2012 [facility] Patient Information 2014 [place], [place].

## 2013-08-12 ENCOUNTER — Other Ambulatory Visit: Payer: Self-pay | Admitting: Family Medicine

## 2013-08-12 ENCOUNTER — Other Ambulatory Visit (HOSPITAL_COMMUNITY)
Admission: RE | Admit: 2013-08-12 | Discharge: 2013-08-12 | Disposition: A | Discharge status: Home / Self Care | Payer: Managed Care, Other (non HMO) | Source: Ambulatory Visit | Attending: Family Medicine | Admitting: Family Medicine

## 2013-08-12 DIAGNOSIS — Z0000 Encounter for general adult medical examination without abnormal findings: Secondary | ICD-10-CM | POA: Insufficient documentation

## 2013-09-04 ENCOUNTER — Other Ambulatory Visit (HOSPITAL_COMMUNITY): Payer: Self-pay | Admitting: Family Medicine

## 2013-09-04 DIAGNOSIS — Z1231 Encounter for screening mammogram for malignant neoplasm of breast: Secondary | ICD-10-CM

## 2013-09-09 ENCOUNTER — Ambulatory Visit (HOSPITAL_COMMUNITY)
Admission: RE | Admit: 2013-09-09 | Discharge: 2013-09-09 | Disposition: A | Discharge status: Home / Self Care | Payer: Managed Care, Other (non HMO) | Source: Ambulatory Visit | Attending: Family Medicine | Admitting: Family Medicine

## 2013-09-09 DIAGNOSIS — Z1231 Encounter for screening mammogram for malignant neoplasm of breast: Secondary | ICD-10-CM | POA: Insufficient documentation

## 2013-09-16 ENCOUNTER — Ambulatory Visit (HOSPITAL_COMMUNITY): Payer: Managed Care, Other (non HMO)

## 2013-11-23 ENCOUNTER — Encounter: Payer: Self-pay | Admitting: *Deleted

## 2013-11-23 ENCOUNTER — Encounter: Payer: Self-pay | Admitting: Internal Medicine

## 2013-11-23 DIAGNOSIS — D496 Neoplasm of unspecified behavior of brain: Secondary | ICD-10-CM | POA: Insufficient documentation

## 2013-11-23 DIAGNOSIS — D649 Anemia, unspecified: Secondary | ICD-10-CM | POA: Insufficient documentation

## 2013-11-23 DIAGNOSIS — M069 Rheumatoid arthritis, unspecified: Secondary | ICD-10-CM | POA: Insufficient documentation

## 2013-11-23 DIAGNOSIS — E559 Vitamin D deficiency, unspecified: Secondary | ICD-10-CM | POA: Insufficient documentation

## 2013-11-23 DIAGNOSIS — Z98811 Dental restoration status: Secondary | ICD-10-CM | POA: Insufficient documentation

## 2014-02-21 ENCOUNTER — Emergency Department (HOSPITAL_COMMUNITY)
Admission: EM | Admit: 2014-02-21 | Discharge: 2014-02-22 | Disposition: A | Discharge status: Home / Self Care | Payer: Managed Care, Other (non HMO)

## 2014-02-21 NOTE — ED Notes (Signed)
Left without being triaged

## 2014-02-21 NOTE — ED Notes (Signed)
No answer x2 

## 2014-02-21 NOTE — ED Notes (Signed)
No answer in the waiting room

## 2014-03-01 ENCOUNTER — Encounter: Payer: Self-pay | Admitting: *Deleted

## 2014-09-01 LAB — BASIC METABOLIC PANEL
BUN: 11 (ref 4–21)
CREATININE: 0.8 (ref 0.5–1.1)
Glucose: 90
Potassium: 4.1 (ref 3.4–5.3)
SODIUM: 130 — AB (ref 137–147)

## 2014-09-01 LAB — CBC AND DIFFERENTIAL
HCT: 34 — AB (ref 36–46)
Hemoglobin: 10.9 — AB (ref 12.0–16.0)
Platelets: 316 (ref 150–399)
WBC: 5.6

## 2014-09-01 LAB — LIPID PANEL
CHOLESTEROL: 211 — AB (ref 0–200)
HDL: 88 — AB (ref 35–70)
LDL Cholesterol: 95
TRIGLYCERIDES: 141 (ref 40–160)

## 2014-09-01 LAB — TSH: TSH: 3.88 (ref 0.41–5.90)

## 2015-05-24 LAB — CBC AND DIFFERENTIAL
HEMATOCRIT: 32 — AB (ref 36–46)
Hemoglobin: 10 — AB (ref 12.0–16.0)
PLATELETS: 331 (ref 150–399)
WBC: 7.4

## 2015-05-24 LAB — BASIC METABOLIC PANEL
BUN: 18 (ref 4–21)
CREATININE: 0.9 (ref 0.5–1.1)
Glucose: 86
Potassium: 4.5 (ref 3.4–5.3)
SODIUM: 136 — AB (ref 137–147)

## 2015-05-24 LAB — VITAMIN B12: VITAMIN B 12: 101

## 2015-05-24 LAB — TSH: TSH: 4.13 (ref 0.41–5.90)

## 2015-05-31 ENCOUNTER — Ambulatory Visit: Payer: Self-pay | Admitting: Orthopedic Surgery

## 2015-05-31 NOTE — H&P (Signed)
Deborah Petersen is an 65 y.o. female.   Chief Complaint: L knee pain HPI: The patient is a 65 year old female who presents today for follow up of their knee. The patient is being followed for their left knee pain. They are now 3 out from injury. Symptoms reported today include: pain with weightbearing (and stairs). The patient feels that they are doing 90 percent better and report their pain level to be mild. Current treatment includes: Glucosamine Chondroitin. The patient presents today following MRI.  Three months status post.   Past Medical History  Diagnosis Date  . Rheumatoid arthritis(714.0)     no current problems or meds.  . Hypothyroidism   . Macromastia 10/2011  . Dental crowns present   . Brain tumor 2005  . Vitamin D deficiency   . Anemia     Past Surgical History  Procedure Laterality Date  . Appendectomy    . Tonsillectomy    . Knee surgery    . Foot surgery    . Abdominal hysterectomy      partial  . Brain meningioma excision  2004  . Breast reduction surgery  11/19/2011    Procedure: MAMMARY REDUCTION  (BREAST);  Surgeon: Cristine Polio, MD;  Location: [facility];  Service: Plastics;  Laterality: Bilateral;  . Anterior and posterior repair  2001  . Hernia repair      No family history on file. Social History:  reports that she has quit smoking. She quit smokeless tobacco use about 39 years ago. She reports that she does not drink alcohol or use illicit drugs.  Allergies:  Allergies  Allergen Reactions  . Erythromycin Nausea And Vomiting     (Not in a hospital admission)  No results found for this or any previous visit (from the past 48 hour(s)). No results found.  Review of Systems  Constitutional: Negative.   HENT: Negative.   Eyes: Negative.   Respiratory: Negative.   Cardiovascular: Negative.   Gastrointestinal: Negative.   Genitourinary: Negative.   Musculoskeletal: Positive for joint pain.  Skin: Negative.    Neurological: Negative.   Psychiatric/Behavioral: Negative.     There were no vitals taken for this visit. Physical Exam  Constitutional: She appears well-developed and well-nourished.  HENT:  Head: Normocephalic.  Eyes: Pupils are equal, round, and reactive to light.  Neck: Normal range of motion.  Cardiovascular: Normal rate.   Respiratory: Effort normal.  GI: Soft.  Musculoskeletal:  On exam, has tender medial joint line. Patellofemoral pain with compression, moderate effusion. Positive McMurray. Knee exam on inspection reveals no evidence of soft tissue swelling, ecchymosis, deformity or erythema. Nontender over the fibular head or the peroneal nerve. Nontender over the quadriceps insertion of the patellar ligament insertion. The range of motion was full. Provocative maneuvers revealed a negative Lachman, negative anterior and posterior drawer and a negative McMurray. No instability was noted with varus and valgus stressing at 0 or 30 degrees. On manual motor test the quadriceps and hamstrings were 5/5. Sensory exam was intact to light touch.  Neurological: She is alert.  Skin: Skin is warm.    MRI demonstrates complex tear of the posterior horn medial meniscus and body, degenerative fraying of the lateral meniscus, partial full thickness loss in all three compartments bone marrow edema of the distal femur, proximal tibia stress related.  Assessment/Plan Symptomatic medial meniscus tear, posttraumatic chondromalacia, refractory.  We discussed option. Given her mechanical symptoms, we discussed knee arthroscopy. I had a long discussion  with the patient concerning the risks and benefits of knee arthroscopy including help from the arthroscopic procedure as well as no help from the arthroscopic procedure or worsening of symptoms. Also discussed infection, DVT, PE, anesthetic complications, etc. Also discussed the possibility of repeat arthroscopic surgery required in the future or total  knee replacement. I provided the patient with an illustrated handout and discussed it in detail as well as discussed the postoperative and perioperative courses and return to functional activities including work. Need for postoperative DVT prophylaxis was discussed as well.  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 No history of a DVT. We will set her up as soon as possible and proceed accordingly. Spent considerable time discussing these issues and her differential diagnosis which just has not come along since her injury.  Keiyon Plack M. 05/31/2015, 4:26 PM

## 2015-06-01 NOTE — Patient Instructions (Addendum)
Deborah Petersen  06/01/2015   Your procedure is scheduled on: 06/15/2015    Report to [facility]  Entrance take [place]  elevators to 3rd floor to  [place] at    0830 AM.  Call this number if you have problems the morning of surgery [phone]   Remember: ONLY 1 PERSON MAY GO WITH YOU TO SHORT STAY TO GET  READY MORNING OF [place].   Do not eat food or drink liquids :After Midnight.     Take these medicines the morning of surgery with A SIP OF WATER: Levothyroxine ( synthroid)                                You may not have any metal on your body including hair pins and              piercings  Do not wear jewelry, make-up, lotions, powders or perfumes, deodorant             Do not wear nail polish.  Do not shave  48 hours prior to surgery.               Do not bring valuables to the hospital. [place].  Contacts, dentures or bridgework may not be worn into surgery.       Patients discharged the day of surgery will not be allowed to drive home.  Name and phone number of your driver: spouseMilus Banister, cell-[phone]  Special Instructions: coughing and deep breathing exercises, leg exercises               Please read over the following fact sheets you were given: _____________________________________________________________________             [facility] - Preparing for Surgery Before surgery, you can play an important role.  Because skin is not sterile, your skin needs to be as free of germs as possible.  You can reduce the number of germs on your skin by washing with CHG (chlorahexidine gluconate) soap before surgery.  CHG is an antiseptic cleaner which kills germs and bonds with the skin to continue killing germs even after washing. Please DO NOT use if you have an allergy to CHG or antibacterial soaps.  If your skin becomes reddened/irritated stop using the CHG and inform your nurse  when you arrive at Short Stay. Do not shave (including legs and underarms) for at least 48 hours prior to the first CHG shower.  You may shave your face/neck. Please follow these instructions carefully:  1.  Shower with CHG Soap the night before surgery and the  morning of Surgery.  2.  If you choose to wash your hair, wash your hair first as usual with your  normal  shampoo.  3.  After you shampoo, rinse your hair and body thoroughly to remove the  shampoo.                           4.  Use CHG as you would any other liquid soap.  You can apply chg directly  to the skin and wash  Gently with a scrungie or clean washcloth.  5.  Apply the CHG Soap to your body ONLY FROM THE NECK DOWN.   Do not use on face/ open                           Wound or open sores. Avoid contact with eyes, ears mouth and genitals (private parts).                       Wash face,  Genitals (private parts) with your normal soap.             6.  Wash thoroughly, paying special attention to the area where your surgery  will be performed.  7.  Thoroughly rinse your body with warm water from the neck down.  8.  DO NOT shower/wash with your normal soap after using and rinsing off  the CHG Soap.                9.  Pat yourself dry with a clean towel.            10.  Wear clean pajamas.            11.  Place clean sheets on your bed the night of your first shower and do not  sleep with pets. Day of Surgery : Do not apply any lotions/deodorants the morning of surgery.  Please wear clean clothes to the hospital/surgery center.  FAILURE TO FOLLOW THESE INSTRUCTIONS MAY RESULT IN THE CANCELLATION OF YOUR SURGERY PATIENT SIGNATURE_________________________________  NURSE SIGNATURE__________________________________  ________________________________________________________________________   Adam Phenix  An incentive spirometer is a tool that can help keep your lungs clear and active. This tool  measures how well you are filling your lungs with each breath. Taking long deep breaths may help reverse or decrease the chance of developing breathing (pulmonary) problems (especially infection) following:  A long period of time when you are unable to move or be active. BEFORE THE PROCEDURE   If the spirometer includes an indicator to show your best effort, your nurse or respiratory therapist will set it to a desired goal.  If possible, sit up straight or lean slightly forward. Try not to slouch.  Hold the incentive spirometer in an upright position. INSTRUCTIONS FOR USE  1. Sit on the edge of your bed if possible, or sit up as far as you can in bed or on a chair. 2. Hold the incentive spirometer in an upright position. 3. Breathe out normally. 4. Place the mouthpiece in your mouth and seal your lips tightly around it. 5. Breathe in slowly and as deeply as possible, raising the piston or the ball toward the top of the column. 6. Hold your breath for 3-5 seconds or for as long as possible. Allow the piston or ball to fall to the bottom of the column. 7. Remove the mouthpiece from your mouth and breathe out normally. 8. Rest for a few seconds and repeat Steps 1 through 7 at least 10 times every 1-2 hours when you are awake. Take your time and take a few normal breaths between deep breaths. 9. The spirometer may include an indicator to show your best effort. Use the indicator as a goal to work toward during each repetition. 10. After each set of 10 deep breaths, practice coughing to be sure your lungs are clear. If you have an incision (the cut made at the time of surgery),  support your incision when coughing by placing a pillow or rolled up towels firmly against it. Once you are able to get out of bed, walk around indoors and cough well. You may stop using the incentive spirometer when instructed by your caregiver.  RISKS AND COMPLICATIONS  Take your time so you do not get dizzy or  light-headed.  If you are in pain, you may need to take or ask for pain medication before doing incentive spirometry. It is harder to take a deep breath if you are having pain. AFTER USE  Rest and breathe slowly and easily.  It can be helpful to keep track of a log of your progress. Your caregiver can provide you with a simple table to help with this. If you are using the spirometer at home, follow these instructions: [place] IF:   You are having difficultly using the spirometer.  You have trouble using the spirometer as often as instructed.  Your pain medication is not giving enough relief while using the spirometer.  You develop fever of 100.5 F (38.1 C) or higher. SEEK IMMEDIATE MEDICAL CARE IF:   You cough up bloody sputum that had not been present before.  You develop fever of 102 F (38.9 C) or greater.  You develop worsening pain at or near the incision site. MAKE SURE YOU:   Understand these instructions.  Will watch your condition.  Will get help right away if you are not doing well or get worse. Document Released: 08/27/2006 Document Revised: 07/09/2011 Document Reviewed: 10/28/2006 [facility] Patient Information 2014 [place], [place].   ________________________________________________________________________

## 2015-06-02 ENCOUNTER — Encounter (HOSPITAL_COMMUNITY)
Admission: RE | Admit: 2015-06-02 | Discharge: 2015-06-02 | Disposition: A | Discharge status: Home / Self Care | Payer: Managed Care, Other (non HMO) | Source: Ambulatory Visit | Attending: Specialist | Admitting: Specialist

## 2015-06-02 ENCOUNTER — Ambulatory Visit: Payer: Self-pay | Admitting: Hematology

## 2015-06-02 ENCOUNTER — Encounter (HOSPITAL_COMMUNITY): Payer: Self-pay

## 2015-06-02 DIAGNOSIS — Z01812 Encounter for preprocedural laboratory examination: Secondary | ICD-10-CM | POA: Diagnosis not present

## 2015-06-02 DIAGNOSIS — M94262 Chondromalacia, left knee: Secondary | ICD-10-CM | POA: Insufficient documentation

## 2015-06-02 DIAGNOSIS — S83232A Complex tear of medial meniscus, current injury, left knee, initial encounter: Secondary | ICD-10-CM | POA: Diagnosis not present

## 2015-06-02 DIAGNOSIS — X58XXXA Exposure to other specified factors, initial encounter: Secondary | ICD-10-CM | POA: Diagnosis not present

## 2015-06-02 LAB — CBC
HEMATOCRIT: 33.8 % — AB (ref 36.0–46.0)
Hemoglobin: 10.4 g/dL — ABNORMAL LOW (ref 12.0–15.0)
MCH: 21.7 pg — ABNORMAL LOW (ref 26.0–34.0)
MCHC: 30.8 g/dL (ref 30.0–36.0)
MCV: 70.6 fL — ABNORMAL LOW (ref 78.0–100.0)
Platelets: 399 10*3/uL (ref 150–400)
RBC: 4.79 MIL/uL (ref 3.87–5.11)
RDW: 16.3 % — AB (ref 11.5–15.5)
WBC: 4.8 10*3/uL (ref 4.0–10.5)

## 2015-06-08 NOTE — Progress Notes (Signed)
Patient aware surgery now on 06/09/2015 from 0730am-0830am.  Will arrive at 0530am.  Patient aware to follow same preop instructions.

## 2015-06-09 ENCOUNTER — Encounter (HOSPITAL_COMMUNITY)
Admission: RE | Disposition: A | Discharge status: Home / Self Care | Payer: Self-pay | Source: Ambulatory Visit | Attending: Specialist

## 2015-06-09 ENCOUNTER — Ambulatory Visit (HOSPITAL_COMMUNITY): Payer: Managed Care, Other (non HMO) | Admitting: Certified Registered"

## 2015-06-09 ENCOUNTER — Encounter (HOSPITAL_COMMUNITY): Payer: Self-pay | Admitting: *Deleted

## 2015-06-09 ENCOUNTER — Ambulatory Visit (HOSPITAL_COMMUNITY)
Admission: RE | Admit: 2015-06-09 | Discharge: 2015-06-09 | Disposition: A | Discharge status: Home / Self Care | Payer: Managed Care, Other (non HMO) | Source: Ambulatory Visit | Attending: Specialist | Admitting: Specialist

## 2015-06-09 DIAGNOSIS — M23222 Derangement of posterior horn of medial meniscus due to old tear or injury, left knee: Secondary | ICD-10-CM | POA: Diagnosis not present

## 2015-06-09 DIAGNOSIS — M67462 Ganglion, left knee: Secondary | ICD-10-CM | POA: Diagnosis not present

## 2015-06-09 DIAGNOSIS — Z87891 Personal history of nicotine dependence: Secondary | ICD-10-CM | POA: Insufficient documentation

## 2015-06-09 DIAGNOSIS — M23201 Derangement of unspecified lateral meniscus due to old tear or injury, left knee: Secondary | ICD-10-CM | POA: Insufficient documentation

## 2015-06-09 DIAGNOSIS — E039 Hypothyroidism, unspecified: Secondary | ICD-10-CM | POA: Insufficient documentation

## 2015-06-09 DIAGNOSIS — M94262 Chondromalacia, left knee: Secondary | ICD-10-CM | POA: Diagnosis not present

## 2015-06-09 DIAGNOSIS — M2342 Loose body in knee, left knee: Secondary | ICD-10-CM | POA: Diagnosis not present

## 2015-06-09 DIAGNOSIS — M1712 Unilateral primary osteoarthritis, left knee: Secondary | ICD-10-CM | POA: Diagnosis not present

## 2015-06-09 DIAGNOSIS — M069 Rheumatoid arthritis, unspecified: Secondary | ICD-10-CM | POA: Diagnosis not present

## 2015-06-09 DIAGNOSIS — M25562 Pain in left knee: Secondary | ICD-10-CM | POA: Diagnosis present

## 2015-06-09 HISTORY — PX: KNEE ARTHROSCOPY: SHX127

## 2015-06-09 SURGERY — ARTHROSCOPY, KNEE
Anesthesia: General | Site: Knee | Laterality: Left

## 2015-06-09 MED ORDER — LACTATED RINGERS IV SOLN: INTRAVENOUS | Status: DC

## 2015-06-09 MED ORDER — STERILE WATER FOR IRRIGATION IR SOLN
Status: DC | PRN
  Administered 2015-06-09: 500 mL

## 2015-06-09 MED ORDER — LACTATED RINGERS IV SOLN
INTRAVENOUS | Status: DC | PRN
  Administered 2015-06-09: 07:00:00 via INTRAVENOUS

## 2015-06-09 MED ORDER — HYDROMORPHONE HCL 1 MG/ML IJ SOLN: 0.2500 mg | INTRAMUSCULAR | Status: DC | PRN

## 2015-06-09 MED ORDER — DOCUSATE SODIUM 100 MG PO CAPS: 100.0000 mg | ORAL_CAPSULE | Freq: Two times a day (BID) | ORAL | Status: DC | PRN

## 2015-06-09 MED ORDER — ASPIRIN EC 81 MG PO TBEC: 325.0000 mg | DELAYED_RELEASE_TABLET | Freq: Every morning | ORAL | Status: DC

## 2015-06-09 MED ORDER — MIDAZOLAM HCL 5 MG/5ML IJ SOLN
INTRAMUSCULAR | Status: DC | PRN
  Administered 2015-06-09: 2 mg via INTRAVENOUS

## 2015-06-09 MED ORDER — LIDOCAINE HCL (CARDIAC) 20 MG/ML IV SOLN
INTRAVENOUS | Status: DC | PRN
  Administered 2015-06-09: 30 mg via INTRAVENOUS

## 2015-06-09 MED ORDER — KETOROLAC TROMETHAMINE 30 MG/ML IJ SOLN: 30.0000 mg | Freq: Once | INTRAMUSCULAR | Status: DC

## 2015-06-09 MED ORDER — LACTATED RINGERS IR SOLN
Status: DC | PRN
  Administered 2015-06-09: 6000 mL

## 2015-06-09 MED ORDER — CEFAZOLIN SODIUM-DEXTROSE 2-3 GM-% IV SOLR
INTRAVENOUS | Status: AC
  Filled 2015-06-09: qty 50

## 2015-06-09 MED ORDER — CEFAZOLIN SODIUM-DEXTROSE 2-3 GM-% IV SOLR
2.0000 g | INTRAVENOUS | Status: AC
  Administered 2015-06-09: 2 g via INTRAVENOUS

## 2015-06-09 MED ORDER — HYDROCODONE-ACETAMINOPHEN 7.5-325 MG PO TABS: 1.0000 | ORAL_TABLET | Freq: Once | ORAL | Status: DC | PRN

## 2015-06-09 MED ORDER — PROMETHAZINE HCL 25 MG/ML IJ SOLN: 6.2500 mg | INTRAMUSCULAR | Status: DC | PRN

## 2015-06-09 MED ORDER — BUPIVACAINE-EPINEPHRINE 0.5% -1:200000 IJ SOLN
INTRAMUSCULAR | Status: DC | PRN
  Administered 2015-06-09: 20 mL

## 2015-06-09 MED ORDER — FENTANYL CITRATE (PF) 100 MCG/2ML IJ SOLN
INTRAMUSCULAR | Status: DC | PRN
  Administered 2015-06-09 (×2): 50 ug via INTRAVENOUS

## 2015-06-09 MED ORDER — MIDAZOLAM HCL 2 MG/2ML IJ SOLN
INTRAMUSCULAR | Status: AC
  Filled 2015-06-09: qty 2

## 2015-06-09 MED ORDER — ONDANSETRON HCL 4 MG/2ML IJ SOLN
INTRAMUSCULAR | Status: DC | PRN
  Administered 2015-06-09: 4 mg via INTRAVENOUS

## 2015-06-09 MED ORDER — OXYCODONE-ACETAMINOPHEN 5-325 MG PO TABS: 1.0000 | ORAL_TABLET | ORAL | Status: DC | PRN

## 2015-06-09 MED ORDER — BUPIVACAINE-EPINEPHRINE (PF) 0.5% -1:200000 IJ SOLN
INTRAMUSCULAR | Status: AC
  Filled 2015-06-09: qty 30

## 2015-06-09 MED ORDER — FENTANYL CITRATE (PF) 100 MCG/2ML IJ SOLN
INTRAMUSCULAR | Status: AC
  Filled 2015-06-09: qty 2

## 2015-06-09 MED ORDER — LIDOCAINE HCL (CARDIAC) 20 MG/ML IV SOLN
INTRAVENOUS | Status: AC
  Filled 2015-06-09: qty 5

## 2015-06-09 MED ORDER — DEXAMETHASONE SODIUM PHOSPHATE 10 MG/ML IJ SOLN
INTRAMUSCULAR | Status: AC
  Filled 2015-06-09: qty 1

## 2015-06-09 MED ORDER — EPHEDRINE SULFATE 50 MG/ML IJ SOLN
INTRAMUSCULAR | Status: AC
  Filled 2015-06-09: qty 1

## 2015-06-09 MED ORDER — DEXAMETHASONE SODIUM PHOSPHATE 10 MG/ML IJ SOLN
INTRAMUSCULAR | Status: DC | PRN
  Administered 2015-06-09: 10 mg via INTRAVENOUS

## 2015-06-09 MED ORDER — EPINEPHRINE HCL 1 MG/ML IJ SOLN
INTRAMUSCULAR | Status: DC | PRN
  Administered 2015-06-09: 2 mg

## 2015-06-09 MED ORDER — ONDANSETRON HCL 4 MG/2ML IJ SOLN
INTRAMUSCULAR | Status: AC
  Filled 2015-06-09: qty 2

## 2015-06-09 MED ORDER — EPHEDRINE SULFATE 50 MG/ML IJ SOLN
INTRAMUSCULAR | Status: DC | PRN
  Administered 2015-06-09: 5 mg via INTRAVENOUS

## 2015-06-09 MED ORDER — SODIUM CHLORIDE 0.9 % IJ SOLN
INTRAMUSCULAR | Status: AC
  Filled 2015-06-09: qty 10

## 2015-06-09 MED ORDER — PROPOFOL 10 MG/ML IV BOLUS
INTRAVENOUS | Status: DC | PRN
Start: 2015-06-09 — End: 2015-06-09
  Administered 2015-06-09: 200 mg via INTRAVENOUS

## 2015-06-09 MED ORDER — EPINEPHRINE HCL 1 MG/ML IJ SOLN
INTRAMUSCULAR | Status: AC
  Filled 2015-06-09: qty 2

## 2015-06-09 MED ORDER — EPINEPHRINE HCL 1 MG/ML IJ SOLN
INTRAMUSCULAR | Status: AC
  Filled 2015-06-09: qty 1

## 2015-06-09 MED ORDER — PROPOFOL 10 MG/ML IV BOLUS
INTRAVENOUS | Status: AC
  Filled 2015-06-09: qty 20

## 2015-06-09 SURGICAL SUPPLY — 33 items
BANDAGE ACE 6X5 VEL STRL LF (GAUZE/BANDAGES/DRESSINGS) ×3 IMPLANT
BLADE 4.2CUDA (BLADE) IMPLANT
BLADE CUDA SHAVER 3.5 (BLADE) ×3 IMPLANT
BOOTIES KNEE HIGH SLOAN (MISCELLANEOUS) ×3 IMPLANT
CLOTH 2% CHLOROHEXIDINE 3PK (PERSONAL CARE ITEMS) ×3 IMPLANT
DECANTER SPIKE VIAL GLASS SM (MISCELLANEOUS) ×2 IMPLANT
DRSG EMULSION OIL 3X3 NADH (GAUZE/BANDAGES/DRESSINGS) ×3 IMPLANT
DRSG PAD ABDOMINAL 8X10 ST (GAUZE/BANDAGES/DRESSINGS) ×3 IMPLANT
DURAPREP 26ML APPLICATOR (WOUND CARE) ×3 IMPLANT
FILTER STRAW (MISCELLANEOUS) ×4 IMPLANT
GAUZE SPONGE 4X4 12PLY STRL (GAUZE/BANDAGES/DRESSINGS) ×3 IMPLANT
GLOVE BIOGEL PI IND STRL 7.0 (GLOVE) IMPLANT
GLOVE BIOGEL PI IND STRL 7.5 (GLOVE) IMPLANT
GLOVE BIOGEL PI INDICATOR 7.0 (GLOVE)
GLOVE BIOGEL PI INDICATOR 7.5 (GLOVE) ×4
GLOVE SURG SS PI 7.0 STRL IVOR (GLOVE) ×3 IMPLANT
GLOVE SURG SS PI 7.5 STRL IVOR (GLOVE) ×2 IMPLANT
GLOVE SURG SS PI 8.0 STRL IVOR (GLOVE) ×3 IMPLANT
GOWN STRL REUS W/TWL XL LVL3 (GOWN DISPOSABLE) ×7 IMPLANT
KIT BASIN OR (CUSTOM PROCEDURE TRAY) ×3 IMPLANT
MANIFOLD NEPTUNE II (INSTRUMENTS) ×3 IMPLANT
NDL HYPO 25X1 1.5 SAFETY (NEEDLE) IMPLANT
NEEDLE HYPO 25X1 1.5 SAFETY (NEEDLE) ×3 IMPLANT
PACK ARTHROSCOPY WL (CUSTOM PROCEDURE TRAY) ×3 IMPLANT
PAD ABD 8X10 STRL (GAUZE/BANDAGES/DRESSINGS) ×3 IMPLANT
PADDING CAST COTTON 6X4 STRL (CAST SUPPLIES) ×3 IMPLANT
SUT ETHILON 4 0 PS 2 18 (SUTURE) ×3 IMPLANT
SYR 27GX1/2 1ML LL SAFETY (SYRINGE) ×2 IMPLANT
TOWEL OR 17X26 10 PK STRL BLUE (TOWEL DISPOSABLE) ×3 IMPLANT
TUBING ARTHRO INFLOW-ONLY STRL (TUBING) ×3 IMPLANT
WAND HAND CNTRL MULTIVAC 50 (MISCELLANEOUS) ×3 IMPLANT
WAND HAND CNTRL MULTIVAC 90 (MISCELLANEOUS) IMPLANT
WRAP KNEE MAXI GEL POST OP (GAUZE/BANDAGES/DRESSINGS) ×3 IMPLANT

## 2015-06-09 NOTE — Op Note (Signed)
Deborah Petersen, Deborah Petersen             ACCOUNT NO.:  [id number]  MEDICAL RECORD NO.:  [id number]  LOCATION:  [facility]                         FACILITY:  [facility]  PHYSICIAN:  Susa Day, M.D.    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  PROCEDURE PERFORMED: 1. Left knee arthroscopy. 2. Partial medial and lateral meniscectomies. 3. Excision of ganglion cyst. 4. Chondroplasty of patella, femoral sulcus, medial femoral condyle.  ANESTHESIA:  General.  ASSISTANT:  Cleophas Dunker, PA.  HISTORY:  64, mechanical symptoms, locking and giving way.  MRI indicating complex tear of the medial meniscus posteriorly, stress reaction of tibial plateau, ACL ganglion cyst, tricompartmental chondromalacia.  She was indicated for arthroscopic evaluation, partial meniscectomy, and debridement.  Risks and benefits were discussed including bleeding, infection, damage to neurovascular structures, no change in symptoms, worsening symptoms, DVT, PE, anesthetic complications, etc.  TECHNIQUE:  Patient in supine position, after induction of adequate general anesthesia, 2 g Kefzol, left lower extremity was prepped and draped in usual sterile fashion.  Lateral parapatellar portal was fashioned with a #11 blade and ingress cannula atraumatically placed. Irrigant was utilized to insufflate the joint.  Under direct visualization, a medial parapatellar portal was fashioned with a #11 blade after localization with 18-gauge needle sparing the medial meniscus using 65 mmHg.  We evacuated the synovial fluid with cartilaginous debris, loose bodies within the medial compartment. Extensive grade 3 changes, chondral flap tears  were noted in the femoral condyle.  Light chondroplasty performed with a 3.5 shaver.  Complex tear in the meniscus was noted.  Through a combination of straight upbiting basket rongeurs, we resected the meniscus to a stable base. Approximately 50% of the posterior 3rd was excised and contoured with a 3.5 shaver and an Arthur wand.  Remnant was stable to probe palpation. No grade 4 changes were noted.  ACL with a small cyst in the anterior fibers of the ACL was debrided with a 3.5 shaver preserving in the ACL fibers.  Mild fraying of the ACL, but it was intact.  Negative anterior drawer.  Lateral compartment revealed radial tearing of the anterior half.  We introduced a 3.5 shaver and debrided to a stable base.  Approximately 20% anterior half was excised.  Light chondroplasty of femoral condyle, tibial plateau was unremarkable.  Suprapatellar pouch, grade 3 changes of the sulcus and of the patella. There was normal patellofemoral tracking.  Gutters were unremarkable. Light chondroplasty performed of the patellofemoral joint.  Next, revisit all compartments.  No further pathology amenable to arthroscopic intervention.  I, therefore, removed all instrumentation.  Portals were closed with 4-0 nylon simple sutures, 0.25% Marcaine with epinephrine was infiltrated in the joint.  Wound was dressed sterilely.  Woke without difficulty and transported to the recovery room in satisfactory condition.  The patient tolerated the procedure well.  No complications.  Minimal blood loss.     Susa Day, M.D.  JB/MEDQ  D:  06/09/2015  T:  06/09/2015  Job:  [id number]

## 2015-06-09 NOTE — Anesthesia Postprocedure Evaluation (Signed)
Anesthesia Post Note  Patient: Deborah Petersen  Procedure(s) Performed: Procedure(s) (LRB): LEFT KNEE ARTHROSCOPY WITH PARTIAL MEDIAL AND LATERAL MENISECTOMY AND DEBRIDEMENT (Left)  Patient location during evaluation: PACU Anesthesia Type: General Level of consciousness: awake and alert Pain management: pain level controlled Vital Signs Assessment: post-procedure vital signs reviewed and stable Respiratory status: spontaneous breathing Cardiovascular status: blood pressure returned to baseline Anesthetic complications: no    Last Vitals:  Filed Vitals:   06/09/15 0936 06/09/15 0947  BP: 119/71 137/76  Pulse: 71 74  Temp: 36.3 C 36.3 C  Resp: 12 12    Last Pain:  Filed Vitals:   06/09/15 1107  PainSc: 0-No pain                 Tiajuana Amass

## 2015-06-09 NOTE — Interval H&P Note (Signed)
History and Physical Interval Note:  06/09/2015 7:29 AM  Deborah Petersen  has presented today for surgery, with the diagnosis of MEDIAL MENISCUS TEAR LEFT KNEE  The various methods of treatment have been discussed with the patient and family. After consideration of risks, benefits and other options for treatment, the patient has consented to  Procedure(s): LEFT KNEE ARTHROSCOPY WITH PARTIAL MEDIAL MENISECTOMY, DEBRIDEMENT, POSSIBLE MICRO FRACTURE (Left) as a surgical intervention .  The patient's history has been reviewed, patient examined, no change in status, stable for surgery.  I have reviewed the patient's chart and labs.  Questions were answered to the patient's satisfaction.     Mukhtar Shams C

## 2015-06-09 NOTE — Anesthesia Preprocedure Evaluation (Addendum)
Anesthesia Evaluation  Patient identified by MRN, date of birth, ID band Patient awake    Reviewed: Allergy & Precautions, NPO status , Patient's Chart, lab work & pertinent test results  Airway Mallampati: I  TM Distance: >3 FB Neck ROM: Full    Dental  (+) Dental Advisory Given   Pulmonary former smoker,    breath sounds clear to auscultation       Cardiovascular negative cardio ROS   Rhythm:Regular Rate:Normal     Neuro/Psych negative neurological ROS     GI/Hepatic negative GI ROS, Neg liver ROS,   Endo/Other  Hypothyroidism   Renal/GU negative Renal ROS     Musculoskeletal  (+) Arthritis ,   Abdominal   Peds  Hematology  (+) anemia ,   Anesthesia Other Findings   Reproductive/Obstetrics                            Lab Results  Component Value Date   WBC 4.8 06/02/2015   HGB 10.4* 06/02/2015   HCT 33.8* 06/02/2015   MCV 70.6* 06/02/2015   PLT 399 06/02/2015   Lab Results  Component Value Date   CREATININE 0.98 01/30/2013   BUN 13 01/30/2013   NA 139 01/30/2013   K 3.8 01/30/2013   CL 104 01/30/2013   CO2 25 01/30/2013    Anesthesia Physical Anesthesia Plan  ASA: II  Anesthesia Plan: General   Post-op Pain Management:    Induction: Intravenous  Airway Management Planned: LMA  Additional Equipment:   Intra-op Plan:   Post-operative Plan: Extubation in OR  Informed Consent: I have reviewed the patients History and Physical, chart, labs and discussed the procedure including the risks, benefits and alternatives for the proposed anesthesia with the patient or authorized representative who has indicated his/her understanding and acceptance.   Dental advisory given  Plan Discussed with: CRNA  Anesthesia Plan Comments:         Anesthesia Quick Evaluation

## 2015-06-09 NOTE — Transfer of Care (Signed)
Immediate Anesthesia Transfer of Care Note  Patient: Deborah Petersen  Procedure(s) Performed: Procedure(s): LEFT KNEE ARTHROSCOPY WITH PARTIAL MEDIAL AND LATERAL MENISECTOMY AND DEBRIDEMENT (Left)  Patient Location: PACU  Anesthesia Type:General  Level of Consciousness:  sedated, patient cooperative and responds to stimulation  Airway & Oxygen Therapy:Patient Spontanous Breathing and Patient connected to face mask oxgen  Post-op Assessment:  Report given to PACU RN and Post -op Vital signs reviewed and stable  Post vital signs:  Reviewed and stable  Last Vitals:  Filed Vitals:   06/09/15 0541  BP: 116/78  Pulse: 67  Temp: 36.6 C  Resp: 18    Complications: No apparent anesthesia complications

## 2015-06-09 NOTE — H&P (View-Only) (Signed)
Deborah Petersen is an 65 y.o. female.   Chief Complaint: L knee pain HPI: The patient is a 65 year old female who presents today for follow up of their knee. The patient is being followed for their left knee pain. They are now 3 out from injury. Symptoms reported today include: pain with weightbearing (and stairs). The patient feels that they are doing 90 percent better and report their pain level to be mild. Current treatment includes: Glucosamine Chondroitin. The patient presents today following MRI.  Three months status post.   Past Medical History  Diagnosis Date  . Rheumatoid arthritis(714.0)     no current problems or meds.  . Hypothyroidism   . Macromastia 10/2011  . Dental crowns present   . Brain tumor 2005  . Vitamin D deficiency   . Anemia     Past Surgical History  Procedure Laterality Date  . Appendectomy    . Tonsillectomy    . Knee surgery    . Foot surgery    . Abdominal hysterectomy      partial  . Brain meningioma excision  2004  . Breast reduction surgery  11/19/2011    Procedure: MAMMARY REDUCTION  (BREAST);  Surgeon: Cristine Polio, MD;  Location: [place];  Service: Plastics;  Laterality: Bilateral;  . Anterior and posterior repair  2001  . Hernia repair      No family history on file. Social History:  reports that she has quit smoking. She quit smokeless tobacco use about 39 years ago. She reports that she does not drink alcohol or use illicit drugs.  Allergies:  Allergies  Allergen Reactions  . Erythromycin Nausea And Vomiting     (Not in a hospital admission)  No results found for this or any previous visit (from the past 48 hour(s)). No results found.  Review of Systems  Constitutional: Negative.   HENT: Negative.   Eyes: Negative.   Respiratory: Negative.   Cardiovascular: Negative.   Gastrointestinal: Negative.   Genitourinary: Negative.   Musculoskeletal: Positive for joint pain.  Skin: Negative.    Neurological: Negative.   Psychiatric/Behavioral: Negative.     There were no vitals taken for this visit. Physical Exam  Constitutional: She appears well-developed and well-nourished.  HENT:  Head: Normocephalic.  Eyes: Pupils are equal, round, and reactive to light.  Neck: Normal range of motion.  Cardiovascular: Normal rate.   Respiratory: Effort normal.  GI: Soft.  Musculoskeletal:  On exam, has tender medial joint line. Patellofemoral pain with compression, moderate effusion. Positive McMurray. Knee exam on inspection reveals no evidence of soft tissue swelling, ecchymosis, deformity or erythema. Nontender over the fibular head or the peroneal nerve. Nontender over the quadriceps insertion of the patellar ligament insertion. The range of motion was full. Provocative maneuvers revealed a negative Lachman, negative anterior and posterior drawer and a negative McMurray. No instability was noted with varus and valgus stressing at 0 or 30 degrees. On manual motor test the quadriceps and hamstrings were 5/5. Sensory exam was intact to light touch.  Neurological: She is alert.  Skin: Skin is warm.    MRI demonstrates complex tear of the posterior horn medial meniscus and body, degenerative fraying of the lateral meniscus, partial full thickness loss in all three compartments bone marrow edema of the distal femur, proximal tibia stress related.  Assessment/Plan Symptomatic medial meniscus tear, posttraumatic chondromalacia, refractory.  We discussed option. Given her mechanical symptoms, we discussed knee arthroscopy. I had a long discussion  with the patient concerning the risks and benefits of knee arthroscopy including help from the arthroscopic procedure as well as no help from the arthroscopic procedure or worsening of symptoms. Also discussed infection, DVT, PE, anesthetic complications, etc. Also discussed the possibility of repeat arthroscopic surgery required in the future or total  knee replacement. I provided the patient with an illustrated handout and discussed it in detail as well as discussed the postoperative and perioperative courses and return to functional activities including work. Need for postoperative DVT prophylaxis was discussed as well.  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 No history of a DVT. We will set her up as soon as possible and proceed accordingly. Spent considerable time discussing these issues and her differential diagnosis which just has not come along since her injury.  Nathan Stallworth M. 05/31/2015, 4:26 PM

## 2015-06-09 NOTE — Progress Notes (Signed)
Asst up to void w walker . Tolerated well. Denies dizziness. Crutches ordered for  Home, as patient preference over walker

## 2015-06-09 NOTE — Discharge Instructions (Signed)
ARTHROSCOPIC KNEE SURGERY HOME CARE INSTRUCTIONS   PAIN You will be expected to have a moderate amount of pain in the affected knee for approximately two weeks.  However, the first two to four days will be the most severe in terms of the pain you will experience.  Prescriptions have been provided for you to take as needed for the pain.  The pain can be markedly reduced by using the ice/compressive bandage given.  Exchange the ice packs whenever they thaw.  During the night, keep the bandage on because it will still provide some compression for the swelling.  Also, keep the leg elevated on pillows above your heart, and this will help alleviate the pain and swelling.  MEDICATION Prescriptions have been provided to take as needed for pain. To prevent blood clots, take Aspirin 325mg  daily with a meal if not on a blood thinner and if no history of stomach ulcers.  ACTIVITY It is preferred that you stay on bedrest for approximately 24 hours.  However, you may go to the bathroom with help.  After this, you can start to be up and about progressively more.  Remember that the swelling may still increase after three to four days if you are up and doing too much.  You may put as much weight on the affected leg as pain will allow.  Use your crutches for comfort and safety.  However, as soon as you are able, you may discard the crutches and go without them.   DRESSING Keep the current dressing as dry as possible.  Two days after your surgery, you may remove the ice/compressive wrap, and surgical dressing.  You may now take a shower, but do not scrub the sounds directly with soap.  Let water rinse over these and gently wipe with your hand.  Reapply band-aids over the puncture wounds and more gauze if needed.  A slight amount of thin drainage can be normal at this time, and do not let it frighten you.  Reapply the ice/compressive wrap.  You may now repeat this every day each time you shower.  SYMPTOMS TO REPORT TO  YOUR DOCTOR  -Extreme pain.  -Extreme swelling.  -Temperature above 101 degrees that does not come down with acetaminophen     (Tylenol).  -Any changes in the feeling, color or movement of your toes.  -Extreme redness, heat, swelling or drainage at your incision  EXERCISE It is preferred that you begin to exercise on the day of your surgery.  Straight leg raises and short arc quads should be begun the afternoon or evening of surgery and continued until you come back for your follow-up appointment.   Attached is an instruction sheet on how to perform these two simple exercises.  Do these at least three times per day if not more.  You may bend your knee as much as is comfortable.  The puncture wounds may occasionally be slightly uncomfortable with bending of the knee.  Do not let this frighten you.  It is important to keep your knee motion, but do not overdo it.  If you have significant pain, simply do not bend the knee as far.   You will be given more exercises to perform at your first return visit.    RETURN APPOINTMENT Please make an appointment to be seen by your doctor in 14 days from your surgery.  Patient Signature:  ________________________________________________________  Nurse's Signature:  ________________________________________________________  General Anesthesia, Adult, Care After Refer to this sheet in the next few weeks. These instructions provide you with information on caring for yourself after your procedure. Your health care provider may also give you more specific instructions. Your treatment has been planned according to current medical practices, but problems sometimes occur. Call your health care provider if you have any problems or questions after your procedure. WHAT TO EXPECT AFTER THE PROCEDURE After the procedure, it is typical to experience:  Sleepiness.  Nausea and vomiting. HOME CARE INSTRUCTIONS  For the first 24 hours after general  anesthesia:  Have a responsible person with you.  Do not drive a car. If you are alone, do not take public transportation.  Do not drink alcohol.  Do not take medicine that has not been prescribed by your health care provider.  Do not sign important papers or make important decisions.  You may resume a normal diet and activities as directed by your health care provider.  If you have questions or problems that seem related to general anesthesia, call the hospital and ask for the anesthetist or anesthesiologist on call. SEEK MEDICAL CARE IF:  You have nausea and vomiting that continue the day after anesthesia.  You develop a rash. SEEK IMMEDIATE MEDICAL CARE IF:   You have difficulty breathing.  You have chest pain.  You have any allergic problems.   This information is not intended to replace advice given to you by your health care provider. Make sure you discuss any questions you have with your health care provider.   Document Released: 07/23/2000 Document Revised: 05/07/2014 Document Reviewed: 08/15/2011 Elsevier Interactive Patient Education [employer].

## 2015-06-09 NOTE — Brief Op Note (Signed)
06/09/2015  8:30 AM  PATIENT:  Deborah Petersen  65 y.o. female  PRE-OPERATIVE DIAGNOSIS:  MEDIAL MENISCUS TEAR LEFT KNEE  POST-OPERATIVE DIAGNOSIS:  MEDIAL MENISCUS TEAR LEFT KNEE  PROCEDURE:  Procedure(s): LEFT KNEE ARTHROSCOPY WITH PARTIAL MEDIAL AND LATERAL MENISECTOMY AND DEBRIDEMENT (Left)  SURGEON:  Surgeon(s) and Role:    * Susa Day, MD - Primary  PHYSICIAN ASSISTANT:   ASSISTANTS: [place]   ANESTHESIA:   general  EBL:     BLOOD ADMINISTERED:none  DRAINS: none   LOCAL MEDICATIONS USED:  MARCAINE     SPECIMEN:  No Specimen  DISPOSITION OF SPECIMEN:  N/A  COUNTS:  YES  TOURNIQUET:  * No tourniquets in log *  DICTATION: .Other Dictation: Dictation Number [id number]  PLAN OF CARE: Discharge to home after PACU  PATIENT DISPOSITION:  PACU - hemodynamically stable.   Delay start of Pharmacological VTE agent (>24hrs) due to surgical blood loss or risk of bleeding: no

## 2015-06-09 NOTE — Progress Notes (Signed)
Patient demonstrated crutch walking and did well

## 2015-06-09 NOTE — Anesthesia Procedure Notes (Signed)
Procedure Name: LMA Insertion Date/Time: 06/09/2015 7:36 AM Performed by: Lajuana Carry E Pre-anesthesia Checklist: Patient identified, Emergency Drugs available, Suction available and Patient being monitored Patient Re-evaluated:Patient Re-evaluated prior to inductionOxygen Delivery Method: Circle system utilized Preoxygenation: Pre-oxygenation with 100% oxygen Intubation Type: IV induction Ventilation: Mask ventilation without difficulty LMA: LMA inserted LMA Size: 4.0 Number of attempts: 1 Placement Confirmation: positive ETCO2 and breath sounds checked- equal and bilateral Tube secured with: Tape Dental Injury: Teeth and Oropharynx as per pre-operative assessment

## 2015-07-26 LAB — LIPID PANEL
CHOLESTEROL: 190 (ref 0–200)
HDL: 85 — AB (ref 35–70)
LDL CALC: 90
TRIGLYCERIDES: 77 (ref 40–160)

## 2015-07-26 LAB — CBC AND DIFFERENTIAL
HEMATOCRIT: 31 — AB (ref 36–46)
HEMOGLOBIN: 9.7 — AB (ref 12.0–16.0)
Platelets: 324 (ref 150–399)
WBC: 4.9

## 2015-07-26 LAB — TSH: TSH: 3.7 (ref 0.41–5.90)

## 2015-07-26 LAB — VITAMIN B12: VITAMIN B 12: 96

## 2016-10-03 ENCOUNTER — Ambulatory Visit (INDEPENDENT_AMBULATORY_CARE_PROVIDER_SITE_OTHER): Payer: BC Managed Care – PPO | Admitting: Family Medicine

## 2016-10-03 ENCOUNTER — Encounter: Payer: Self-pay | Admitting: Family Medicine

## 2016-10-03 VITALS — BP 119/65 | HR 61 | Ht 61.0 in | Wt 187.0 lb

## 2016-10-03 DIAGNOSIS — Z6835 Body mass index (BMI) 35.0-35.9, adult: Secondary | ICD-10-CM

## 2016-10-03 DIAGNOSIS — E559 Vitamin D deficiency, unspecified: Secondary | ICD-10-CM

## 2016-10-03 DIAGNOSIS — E039 Hypothyroidism, unspecified: Secondary | ICD-10-CM | POA: Diagnosis not present

## 2016-10-03 DIAGNOSIS — M23007 Cystic meniscus, unspecified meniscus, left knee: Secondary | ICD-10-CM | POA: Insufficient documentation

## 2016-10-03 DIAGNOSIS — Z8639 Personal history of other endocrine, nutritional and metabolic disease: Secondary | ICD-10-CM

## 2016-10-03 DIAGNOSIS — Z90710 Acquired absence of both cervix and uterus: Secondary | ICD-10-CM

## 2016-10-03 DIAGNOSIS — D509 Iron deficiency anemia, unspecified: Secondary | ICD-10-CM

## 2016-10-03 MED ORDER — FUSION PLUS PO CAPS: 1.0000 | ORAL_CAPSULE | Freq: Every day | ORAL | 3 refills | Status: DC

## 2016-10-03 NOTE — Patient Instructions (Signed)
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
## 2016-10-03 NOTE — Assessment & Plan Note (Signed)
Will be getting plasma rich supplementation in future.

## 2016-10-04 LAB — CBC WITH DIFFERENTIAL/PLATELET
BASOS ABS: 0 10*3/uL (ref 0.0–0.2)
Basos: 0 %
EOS (ABSOLUTE): 0.2 10*3/uL (ref 0.0–0.4)
Eos: 3 %
HEMATOCRIT: 40.7 % (ref 34.0–46.6)
Hemoglobin: 13.3 g/dL (ref 11.1–15.9)
IMMATURE GRANULOCYTES: 0 %
Immature Grans (Abs): 0 10*3/uL (ref 0.0–0.1)
LYMPHS ABS: 2.1 10*3/uL (ref 0.7–3.1)
Lymphs: 35 %
MCH: 27.4 pg (ref 26.6–33.0)
MCHC: 32.7 g/dL (ref 31.5–35.7)
MCV: 84 fL (ref 79–97)
MONOS ABS: 0.4 10*3/uL (ref 0.1–0.9)
Monocytes: 8 %
NEUTROS PCT: 54 %
Neutrophils Absolute: 3.1 10*3/uL (ref 1.4–7.0)
Platelets: 304 10*3/uL (ref 150–379)
RBC: 4.85 x10E6/uL (ref 3.77–5.28)
RDW: 14.1 % (ref 12.3–15.4)
WBC: 5.8 10*3/uL (ref 3.4–10.8)

## 2016-10-04 LAB — COMPREHENSIVE METABOLIC PANEL
ALBUMIN: 4.5 g/dL (ref 3.6–4.8)
ALK PHOS: 104 IU/L (ref 39–117)
ALT: 16 IU/L (ref 0–32)
AST: 26 IU/L (ref 0–40)
Albumin/Globulin Ratio: 1.7 (ref 1.2–2.2)
BILIRUBIN TOTAL: 0.4 mg/dL (ref 0.0–1.2)
BUN / CREAT RATIO: 17 (ref 12–28)
BUN: 15 mg/dL (ref 8–27)
CHLORIDE: 98 mmol/L (ref 96–106)
CO2: 28 mmol/L (ref 18–29)
CREATININE: 0.88 mg/dL (ref 0.57–1.00)
Calcium: 10.3 mg/dL (ref 8.7–10.3)
GFR calc Af Amer: 81 mL/min/{1.73_m2} (ref >59)
GFR calc non Af Amer: 70 mL/min/{1.73_m2} (ref >59)
GLOBULIN, TOTAL: 2.6 g/dL (ref 1.5–4.5)
Glucose: 85 mg/dL (ref 65–99)
Potassium: 5.3 mmol/L — ABNORMAL HIGH (ref 3.5–5.2)
SODIUM: 139 mmol/L (ref 134–144)
Total Protein: 7.1 g/dL (ref 6.0–8.5)

## 2016-10-04 LAB — LIPID PANEL
CHOL/HDL RATIO: 2.4 ratio (ref 0.0–4.4)
Cholesterol, Total: 208 mg/dL — ABNORMAL HIGH (ref 100–199)
HDL: 85 mg/dL (ref >39)
LDL CALC: 100 mg/dL — AB (ref 0–99)
TRIGLYCERIDES: 113 mg/dL (ref 0–149)
VLDL Cholesterol Cal: 23 mg/dL (ref 5–40)

## 2016-10-04 LAB — HEMOGLOBIN A1C
Est. average glucose Bld gHb Est-mCnc: 105 mg/dL
HEMOGLOBIN A1C: 5.3 % (ref 4.8–5.6)

## 2016-10-04 LAB — VITAMIN D 25 HYDROXY (VIT D DEFICIENCY, FRACTURES): Vit D, 25-Hydroxy: 56.9 ng/mL (ref 30.0–100.0)

## 2016-10-04 LAB — VITAMIN B12: VITAMIN B 12: 156 pg/mL — AB (ref 232–1245)

## 2016-10-04 LAB — TSH: TSH: 0.958 u[IU]/mL (ref 0.450–4.500)

## 2016-10-04 LAB — T4, FREE: FREE T4: 1.5 ng/dL (ref 0.82–1.77)

## 2016-10-10 ENCOUNTER — Other Ambulatory Visit: Payer: Self-pay | Admitting: Family Medicine

## 2016-10-10 NOTE — Telephone Encounter (Signed)
We have not prescribed this medication for the patient previously.  Please review and refill if appropriate.  T. Nelson, CMA  

## 2016-10-18 ENCOUNTER — Other Ambulatory Visit: Payer: Self-pay | Admitting: Family Medicine

## 2016-10-18 ENCOUNTER — Telehealth: Payer: Self-pay

## 2016-10-18 DIAGNOSIS — E039 Hypothyroidism, unspecified: Secondary | ICD-10-CM

## 2016-10-18 MED ORDER — LEVOTHYROXINE SODIUM 100 MCG PO TABS
100.0000 ug | ORAL_TABLET | Freq: Every day | ORAL | 1 refills | Status: DC
Start: 2016-10-18 — End: 2016-10-22

## 2016-10-18 NOTE — Telephone Encounter (Signed)
I sent medication to pharmacy on file

## 2016-10-18 NOTE — Telephone Encounter (Signed)
Sent patient a MyChart message to notify.  MPulliam

## 2016-10-18 NOTE — Telephone Encounter (Signed)
Pharmacy sen request for Levoxyl our office has not written for this medication yet. Please advise. Thanks.  MPulliam, CMA

## 2016-10-22 ENCOUNTER — Other Ambulatory Visit: Payer: Self-pay

## 2016-10-22 DIAGNOSIS — E039 Hypothyroidism, unspecified: Secondary | ICD-10-CM

## 2016-10-22 MED ORDER — LEVOTHYROXINE SODIUM 100 MCG PO TABS
100.0000 ug | ORAL_TABLET | Freq: Every day | ORAL | 1 refills | Status: DC
Start: 2016-10-22 — End: 2017-04-16

## 2016-10-22 NOTE — Telephone Encounter (Signed)
[facility] sent request for Levoxyl - [facility] and resent to [facility].  MPulliam, CMA

## 2017-02-12 ENCOUNTER — Ambulatory Visit (INDEPENDENT_AMBULATORY_CARE_PROVIDER_SITE_OTHER): Payer: BC Managed Care – PPO | Admitting: Adult Health

## 2017-02-12 ENCOUNTER — Encounter: Payer: Self-pay | Admitting: Adult Health

## 2017-02-12 VITALS — BP 131/84 | HR 73 | Ht 61.0 in | Wt 192.8 lb

## 2017-02-12 DIAGNOSIS — Z23 Encounter for immunization: Secondary | ICD-10-CM | POA: Diagnosis not present

## 2017-02-12 DIAGNOSIS — M542 Cervicalgia: Secondary | ICD-10-CM | POA: Diagnosis not present

## 2017-02-12 MED ORDER — CYCLOBENZAPRINE HCL 10 MG PO TABS: 10.0000 mg | ORAL_TABLET | Freq: Three times a day (TID) | ORAL | 0 refills | Status: DC | PRN

## 2017-02-12 NOTE — Assessment & Plan Note (Addendum)
Use heating pad on neck for 15-56mins several times daily. Epson Salt baths. Please start exercises on Thursday. Please take the OTC neck/shoulder Aleve per manufacturer's instructions. If neck stiffness/pain continues for another 2 weeks (whcih will be total of 4 weeks of neck pain), then please call clinic.

## 2017-02-12 NOTE — Patient Instructions (Addendum)
Neck Exercises Neck exercises can be important for many reasons:  They can help you to improve and maintain flexibility in your neck. This can be especially important as you age.  They can help to make your neck stronger. This can make movement easier.  They can reduce or prevent neck pain.  They may help your upper back.  Ask your health care provider which neck exercises would be best for you. Exercises Neck Press Repeat this exercise 10 times. Do it first thing in the morning and right before bed or as told by your health care provider. 1. Lie on your back on a firm bed or on the floor with a pillow under your head. 2. Use your neck muscles to push your head down on the pillow and straighten your spine. 3. Hold the position as well as you can. Keep your head facing up and your chin tucked. 4. Slowly count to 5 while holding this position. 5. Relax for a few seconds. Then repeat.  Isometric Strengthening Do a full set of these exercises 2 times a day or as told by your health care provider. 1. Sit in a supportive chair and place your hand on your forehead. 2. Push forward with your head and neck while pushing back with your hand. Hold for 10 seconds. 3. Relax. Then repeat the exercise 3 times. 4. Next, do thesequence again, this time putting your hand against the back of your head. Use your head and neck to push backward against the hand pressure. 5. Finally, do the same exercise on either side of your head, pushing sideways against the pressure of your hand.  Prone Head Lifts Repeat this exercise 5 times. Do this 2 times a day or as told by your health care provider. 1. Lie face-down, resting on your elbows so that your chest and upper back are raised. 2. Start with your head facing downward, near your chest. Position your chin either on or near your chest. 3. Slowly lift your head upward. Lift until you are looking straight ahead. Then continue lifting your head as far back as  you can stretch. 4. Hold your head up for 5 seconds. Then slowly lower it to your starting position.  Supine Head Lifts Repeat this exercise 8-10 times. Do this 2 times a day or as told by your health care provider. 1. Lie on your back, bending your knees to point to the ceiling and keeping your feet flat on the floor. 2. Lift your head slowly off the floor, raising your chin toward your chest. 3. Hold for 5 seconds. 4. Relax and repeat.  Scapular Retraction Repeat this exercise 5 times. Do this 2 times a day or as told by your health care provider. 1. Stand with your arms at your sides. Look straight ahead. 2. Slowly pull both shoulders backward and downward until you feel a stretch between your shoulder blades in your upper back. 3. Hold for 10-30 seconds. 4. Relax and repeat.  Contact a health care provider if:  Your neck pain or discomfort gets much worse when you do an exercise.  Your neck pain or discomfort does not improve within 2 hours after you exercise. If you have any of these problems, stop exercising right away. Do not do the exercises again unless your health care provider says that you can. Get help right away if:  You develop sudden, severe neck pain. If this happens, stop exercising right away. Do not do the exercises again unless your   health care provider says that you can. Exercises Neck Stretch  Repeat this exercise 3-5 times. 1. Do this exercise while standing or while sitting in a chair. 2. Place your feet flat on the floor, shoulder-width apart. 3. Slowly turn your head to the right. Turn it all the way to the right so you can look over your right shoulder. Do not tilt or tip your head. 4. Hold this position for 10-30 seconds. 5. Slowly turn your head to the left, to look over your left shoulder. 6. Hold this position for 10-30 seconds.  Neck Retraction Repeat this exercise 8-10 times. Do this 3-4 times a day or as told by your health care  provider. 1. Do this exercise while standing or while sitting in a sturdy chair. 2. Look straight ahead. Do not bend your neck. 3. Use your fingers to push your chin backward. Do not bend your neck for this movement. Continue to face straight ahead. If you are doing the exercise properly, you will feel a slight sensation in your throat and a stretch at the back of your neck. 4. Hold the stretch for 1-2 seconds. Relax and repeat.  This information is not intended to replace advice given to you by your health care provider. Make sure you discuss any questions you have with your health care provider. Document Released: 03/28/2015 Document Revised: 09/22/2015 Document Reviewed: 10/25/2014 Elsevier Interactive Patient Education  2017 [place]. Cyclobenzaprine Extended Release Capsule What is this medicine? CYCLOBENZAPRINE (sye kloe BEN za preen) is a muscle relaxer. It is used to treat muscle pain, spasms, and stiffness. This medicine may be used for other purposes; ask your health care provider or pharmacist if you have questions. COMMON BRAND NAME(S): Amrix What should I tell my health care provider before I take this medicine? They need to know if you have any of these conditions: -heart disease -irregular heartbeat -liver disease -past heart attack -thyroid problem -an unusual or allergic reaction to cyclobenzaprine, tricyclic antidepressants, lactose, other medicines, foods, dyes, or preservatives -pregnant or trying to get pregnant -breast-feeding How should I use this medicine? Take this medicine by mouth with a glass of water. Follow the directions on the prescription label. Do not cut, crush or chew this medicine. If this medicine upsets your stomach, take it with food or milk. Take your medicine at regular intervals. Do not take it more often than directed. Talk to your pediatrician regarding the use of this medicine in children. Special care may be needed. Overdosage: If you  think you have taken too much of this medicine contact a poison control center or emergency room at once. NOTE: This medicine is only for you. Do not share this medicine with others. What if I miss a dose? If you miss a dose, take it as soon as you can. If it is almost time for your next dose, take only that dose. Do not take double or extra doses. What may interact with this medicine? Do not take this medicine with any of the following medications: -certain medicines for fungal infections like fluconazole, itraconazole, ketoconazole, posaconazole, voriconazole -cisapride -dofetilide -dronedarone -halofantrine -levomethadyl -MAOIs like Carbex, Eldepryl, Marplan, Nardil, and Parnate -narcotic medicines for cough -pimozide -thioridazine -ziprasidone This medicine may also interact with the following medications: -alcohol -antihistamines for allergy, cough and cold -certain medicines for anxiety or sleep -certain medicines for cancer -certain medicines for depression like amitriptyline, fluoxetine, sertraline -certain medicines for infection like alfuzosin, chloroquine, clarithromycin, levofloxacin, mefloquine, pentamidine, troleandomycin -certain medicines  for irregular heart beat -certain medicines for seizures like phenobarbital, primidone -contrast dyes -general anesthetics like halothane, isoflurane, methoxyflurane, propofol -local anesthetics like lidocaine, pramoxine, tetracaine -medicines that relax muscles for surgery -narcotic medicines for pain -other medicines that prolong the QT interval (cause an abnormal heart rhythm) -phenothiazines like chlorpromazine, mesoridazine, prochlorperazine This list may not describe all possible interactions. Give your health care provider a list of all the medicines, herbs, non-prescription drugs, or dietary supplements you use. Also tell them if you smoke, drink alcohol, or use illegal drugs. Some items may interact with your medicine. What  should I watch for while using this medicine? Tell your doctor or healthcare professional if your symptoms do not start to get better or if they get worse. You may get drowsy or dizzy. Do not drive, use machinery, or do anything that needs mental alertness until you know how this medicine affects you. Do not stand or sit up quickly, especially if you are an older patient. This reduces the risk of dizzy or fainting spells. Alcohol may interfere with the effect of this medicine. Avoid alcoholic drinks. If you are taking another medicine that also causes drowsiness, you may have more side effects. Give your health care provider a list of all medicines you use. Your doctor will tell you how much medicine to take. Do not take more medicine than directed. Call emergency for help if you have problems breathing or unusual sleepiness. Your mouth may get dry. Chewing sugarless gum or sucking hard candy, and drinking plenty of water may help. Contact your doctor if the problem does not go away or is severe. What side effects may I notice from receiving this medicine? Side effects that you should report to your doctor or health care professional as soon as possible: -allergic reactions like skin rash, itching or hives, swelling of the face, lips, or tongue -breathing problems -chest pain -fast, irregular heartbeat -hallucinations -seizures -unusually weak or tired Side effects that usually do not require medical attention (report to your doctor or health care professional if they continue or are bothersome): -headache -nausea, vomiting This list may not describe all possible side effects. Call your doctor for medical advice about side effects. You may report side effects to FDA at 1-800-FDA-1088. Where should I keep my medicine? Keep out of the reach of children. Store at room temperature between 15 and 30 degrees C (59 and 86 degrees F). Keep container tightly closed. Throw away any unused medicine after  the expiration date. NOTE: This sheet is a summary. It may not cover all possible information. If you have questions about this medicine, talk to your doctor, pharmacist, or health care provider.  2018 Elsevier/Gold Standard (2015-01-25 12:08:30)  Use heating pad on neck for 15-3mins several times daily. Epson Salt baths. Please start exercises on Thursday. Please take the OTC neck/shoulder Aleve per manufacturer's instructions. If neck stiffness/pain continues for another 2 weeks (whcih will be total of 4 weeks of neck pain), then please call clinic. Thoughts for you and your family!

## 2017-02-12 NOTE — Progress Notes (Signed)
Subjective:    Patient ID: Deborah Petersen, female    DOB: 12-Dec-1950, 66 y.o.   MRN: [id number]  HPI:  Ms. Diggs is here for new onset R cervical neck pain with radiation to R trapezius that began 2 weeks ago.  She denies trauma/injury prior to onset of pain.   She denies change in vision or N/V.  Pain is described as aching and constant, at rest 7/10 and with movement 9/10.  She has taken 2 doses of Aleve over the last few days with only minimal pain relief.  She denies numbness/tingling in hands. She is under considerable stress- 64 year old daughter who lives in [place] suffered aneurysm and is in NICU.   They just returned fro [place] this morning at 0100 and are planning on going back in a few days.  Patient Care Team    Relationship Specialty Notifications Start End  Mellody Dance, DO PCP - General Family Medicine  10/03/16   Carol Ada, MD Consulting Physician Gastroenterology  10/03/16   Susa Day, [place] Physician Orthopedic Surgery  10/03/16     Patient Active Problem List   Diagnosis Date Noted  . Cervical pain (neck) 02/12/2017  . Hypothyroidism 10/03/2016  . Cyst of meniscus of left knee- s/p sx Dr Tonita Cong- [facility] 10/03/2016  . S/P hysterectomy- no cervix 10/03/2016  . History of non anemic vitamin B12 deficiency 10/03/2016  . BMI 35.0-35.9,adult 10/03/2016  . h/o RA (rheumatoid arthritis) - cured by faith and prayer   . Dental crowns present   . 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 Vitamin D deficiency   . Fe Def Anemia   . Macromastia 10/29/2011     Past Medical History:  Diagnosis Date  . Anemia   . Brain tumor ([place]) 2005  . Dental crowns present   . Hypothyroidism   . Macromastia 10/2011  . Rheumatoid arthritis(714.0)    no current problems or meds.  . Vitamin D deficiency      Past Surgical History:  Procedure Laterality Date  . ABDOMINAL HYSTERECTOMY     partial  . ANTERIOR AND POSTERIOR REPAIR   2001  . APPENDECTOMY    . BRAIN MENINGIOMA EXCISION  2004  . BREAST REDUCTION SURGERY  11/19/2011   Procedure: MAMMARY REDUCTION  (BREAST);  Surgeon: Cristine Polio, MD;  Location: [facility];  Service: Plastics;  Laterality: Bilateral;  . FOOT SURGERY    . HERNIA REPAIR    . KNEE ARTHROSCOPY Left 06/09/2015   Procedure: LEFT KNEE ARTHROSCOPY WITH PARTIAL MEDIAL AND LATERAL MENISECTOMY AND DEBRIDEMENT;  Surgeon: Susa Day, MD;  Location: [facility];  Service: Orthopedics;  Laterality: Left;  . KNEE SURGERY    . TONSILLECTOMY       History reviewed. No pertinent family history.   History  Drug Use No     History  Alcohol Use No     History  Smoking Status  . Former Smoker  Smokeless Tobacco  . Former Systems developer  . Quit date: 04/30/1976     Outpatient Encounter Prescriptions as of 02/12/2017  Medication Sig  . aspirin EC 81 MG tablet Take 4 tablets (325 mg total) by mouth every morning.  . chlorthalidone (HYGROTON) 25 MG tablet TAKE 1 TABLET BY MOUTH EVERY MORNING  . Cholecalciferol (VITAMIN D-3) 5000 UNITS TABS Take 1 tablet by mouth every morning.   . COD LIVER OIL PO Take 1 capsule by mouth every morning.   Marland Kitchen  Iron-FA-B Cmp-C-Biot-Probiotic (FUSION PLUS) CAPS Take 1 capsule by mouth daily.  Marland Kitchen levothyroxine (SYNTHROID, LEVOTHROID) 100 MCG tablet Take 1 tablet (100 mcg total) by mouth daily before breakfast.  . naproxen sodium (ANAPROX) 220 MG tablet Take 220 mg by mouth 2 (two) times daily as needed (pain.).  . Turmeric 500 MG TABS Take by mouth.  . [DISCONTINUED] levocetirizine (XYZAL) 5 MG tablet Take 5 mg by mouth as needed for allergies.  . cyclobenzaprine (FLEXERIL) 10 MG tablet Take 1 tablet (10 mg total) by mouth 3 (three) times daily as needed for muscle spasms.   No facility-administered encounter medications on file as of 02/12/2017.     Allergies: Erythromycin  Body mass index is 36.43 kg/m.  Blood pressure 131/84, pulse 73, height 5\' 1"   (1.549 m), weight 192 lb 12.8 oz (87.5 kg).     Review of Systems  Constitutional: Positive for fatigue. Negative for activity change, appetite change, chills, diaphoresis, fever and unexpected weight change.  Respiratory: Negative for cough, chest tightness, shortness of breath, wheezing and stridor.   Cardiovascular: Negative for chest pain, palpitations and leg swelling.  Musculoskeletal: Positive for myalgias, neck pain and neck stiffness. Negative for arthralgias, back pain, gait problem and joint swelling.  Neurological: Negative for dizziness and headaches.  Hematological: Does not bruise/bleed easily.  Psychiatric/Behavioral: Positive for sleep disturbance.       Objective:   Physical Exam  Constitutional: She is oriented to person, place, and time. She appears well-developed and well-nourished. No distress.  HENT:  Head: Atraumatic.  Right Ear: External ear normal.  Left Ear: External ear normal.  Portion of R parietal bone missing from brain tumor removal years ago- well healed.  Cardiovascular: Normal rate, regular rhythm, normal heart sounds and intact distal pulses.   Pulmonary/Chest: Effort normal and breath sounds normal. No respiratory distress. She has no wheezes. She has no rales. She exhibits no tenderness.  Musculoskeletal: She exhibits tenderness. She exhibits no edema.       Right shoulder: Normal.       Left shoulder: Normal.       Cervical back: She exhibits tenderness and spasm. She exhibits normal range of motion.  Normal ROM, however slow/guarded. Equal/strong bil grip strengths.  Neurological: She is alert and oriented to person, place, and time.  Skin: Skin is warm and dry. No rash noted. She is not diaphoretic. No erythema. No pallor.  Psychiatric: She has a normal mood and affect. Her behavior is normal. Judgment and thought content normal.  Nursing note and vitals reviewed.         Assessment & Plan:   1. Cervical pain (neck)     Cervical  pain (neck) Use heating pad on neck for 15-74mins several times daily. Epson Salt baths. Please start exercises on Thursday. Please take the OTC neck/shoulder Aleve per manufacturer's instructions. If neck stiffness/pain continues for another 2 weeks (whcih will be total of 4 weeks of neck pain), then please call clinic.     FOLLOW-UP:  Return if symptoms worsen or fail to improve.

## 2017-03-06 ENCOUNTER — Encounter: Payer: Self-pay | Admitting: Family Medicine

## 2017-03-06 ENCOUNTER — Ambulatory Visit: Payer: BC Managed Care – PPO | Admitting: Family Medicine

## 2017-03-06 VITALS — BP 119/77 | HR 87 | Ht 61.0 in | Wt 193.0 lb

## 2017-03-06 DIAGNOSIS — E039 Hypothyroidism, unspecified: Secondary | ICD-10-CM

## 2017-03-06 DIAGNOSIS — Z6835 Body mass index (BMI) 35.0-35.9, adult: Secondary | ICD-10-CM | POA: Diagnosis not present

## 2017-03-06 DIAGNOSIS — E559 Vitamin D deficiency, unspecified: Secondary | ICD-10-CM

## 2017-03-06 DIAGNOSIS — Z713 Dietary counseling and surveillance: Secondary | ICD-10-CM

## 2017-03-06 NOTE — Patient Instructions (Signed)
Use the lose it app or my fitness pal app.  When you sign on do the free free version and you will sign up with an email.  You want to choose to lose 2 pounds per week.   -Goal over the next 2-4 weeks is just to track everything goal is not to lose weight.  Please weigh your food and calculate exactly how much or eating of something.  This is the way to be successful.   - vision board    -Also we need to transition your brain into thinking more positively.  These tasks below are some things I want you to do every day 1)  write 3 new things that you are grateful for every day for 21 days  2)  exercise daily- walk for 15 minutes twice a day every day 3)  you are going to journal every day about one positive experience that you had 4)  meditate every day.  You can go on YouTube and look for 15-minute relaxation meditation or what ever.  But we need to make sure that you are in the moment and relaxing and deep breathing every day 5)  Write 1 positive email every day to praise someone in your life

## 2017-03-06 NOTE — Progress Notes (Signed)
Wt loss OV note   Impression and Recommendations:    1. BMI 35.0-35.9,adult   2. Weight loss counseling, encounter for   3. Vitamin D deficiency   4. Hypothyroidism, unspecified type      Education and routine counseling performed. Handouts provided.  Use the lose it app or my fitness pal app.  When you sign on do the free free version and you will sign up with an email.  You want to choose to lose 2 pounds per week.    Goal over the next 2-4 weeks is just to track everything goal is not to lose weight.  Please weigh your food and calculate exactly how much or eating of something.  This is the way to be successful.     vision board   The patient was counseled, risk factors were discussed, anticipatory guidance given.  - Weight Mgt: Explained to patient what BMI refers to, and what it means medically.    Told patient to think about it as a "medical risk stratification measurement" and how increasing BMI is associated with increasing risk/ or worsening state of various diseases such as hypertension, hyperlipidemia, diabetes, premature OA, depression etc.  American Heart Association guidelines for healthy diet, basically Mediterranean diet, and exercise guidelines of 30 minutes 5 days per week or more discussed in detail.  -Reminded patient the need for yearly complete physical exam office visits in addition to office visits for management of the chronic diseases  -Gross side effects, risk and benefits, and alternatives of medications discussed with patient.  Patient is aware that all medications have potential side effects and we are unable to predict every side effect or drug-drug interaction that may occur.  Expresses verbal understanding and consents to current therapy plan and treatment regimen.   Return for 4 wks- f/up of lose it app/ tracking .   Please see AVS handed out to patient at the end of our visit for further patient instructions/ counseling done  pertaining to today's office visit.    Note: This document was prepared using Dragon voice recognition software and may include unintentional dictation errors.  Mellody Dance, DO 07/01/2018 4:55 PM   ______________________________________________________________________    Subjective:  HPI: Deborah Petersen y.o. female  presents for 3 month follow up for multiple medical problems.  OV for wt loss.    Pt has gained 30- 35lbs in 1 year.  Husabnd is an avid jogger/ very active.  Pt works and wroks 4,00-5,000 steps per day and no additional exercise.   Diet- has never tracked or been made aware of habits in past.   No thoughts about what would be best- just needs help.     Weight:  Wt Readings from Last 3 Encounters:  04/17/18 184 lb 11.2 oz (83.8 kg)  12/17/17 185 lb 4.8 oz (84.1 kg)  08/19/17 185 lb 14.4 oz (84.3 kg)   BMI Readings from Last 3 Encounters:  04/17/18 34.90 kg/m  12/17/17 35.01 kg/m  08/19/17 35.13 kg/m   Lab Results  Component Value Date   HGBA1C 5.4 07/25/2017   HGBA1C 5.3 10/03/2016    Review of Systems: General:   No F/C, wt loss Pulm:   No DIB, SOB, pleuritic chest pain Card:  No CP, palpitations Abd:  No n/v/d or pain Ext:  No inc edema from baseline   Objective: Physical Exam: BP 119/77   Pulse 87   Ht 5\' 1"  (1.549 m)   Wt 193 lb (  87.5 kg)   BMI 36.47 kg/m  Body mass index is 36.47 kg/m. General: Well nourished, in no apparent distress. Eyes: PERRLA, EOMs, conjunctiva clr no swelling or erythema ENT/Mouth: Hearing appears normal.  Mucus Membranes Moist  Neck: Supple, no masses Resp: Respiratory effort- normal, ECTA B/L w/o W/R/R  Cardio: RRR w/o MRGs. Abdomen: no gross distention. Lymphatics:  Brisk peripheral pulses, less 2 sec cap RF, no gross edema  M-sk: Full ROM, 5/5 strength, normal gait.  Skin: Warm, dry without rashes, lesions, ecchymosis.  Neuro: Alert, Oriented Psych: Normal affect, Insight and Judgment  appropriate.    Current Medications:  Current Outpatient Medications on File Prior to Visit  Medication Sig Dispense Refill  . aspirin EC 81 MG tablet Take 4 tablets (325 mg total) by mouth every morning. (Patient taking differently: Take 81 mg by mouth every morning. ) 60 tablet 0  . BEE POLLEN PO Take 1 tablet daily by mouth.    . COD LIVER OIL PO Take 1 capsule by mouth every morning.     . naproxen sodium (ANAPROX) 220 MG tablet Take 220 mg by mouth 2 (two) times daily as needed (pain.).    . Turmeric 500 MG TABS Take by mouth.     No current facility-administered medications on file prior to visit.     Medical History:  Patient Active Problem List   Diagnosis Date Noted  . Hypertension 12/17/2017    Priority: High  . h/o Elevated LDL cholesterol level 07/25/2017    Priority: High  . Hypothyroidism 10/03/2016    Priority: High  . BMI 35.0-35.9,adult 10/03/2016    Priority: High  . Chronic fatigue 12/17/2017    Priority: Medium  . 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 Vitamin D deficiency     Priority: Medium  . Fe Def Anemia     Priority: Medium  . Status post R bunionectomy- 4/19 12/17/2017    Priority: Low  . Cyst of meniscus of left knee- s/p sx Dr Tonita Cong- [facility] 10/03/2016    Priority: Low  . S/P hysterectomy- no cervix 10/03/2016    Priority: Low  . History of non anemic vitamin B12 deficiency 10/03/2016    Priority: Low  . h/o RA (rheumatoid arthritis) - cured by faith and prayer     Priority: Low  . History of abdominal hernia W MESH REPAIR 2003 04/17/2018  . Itching with irritation- abdominal wall overlaying mesh 04/17/2018  . Laryngopharyngeal reflux (LPR) 08/22/2017  . Acute pharyngitis 08/22/2017  . Cervical pain (neck) 02/12/2017  . Dental crowns present   . Macromastia 10/29/2011    Allergies:  Allergies  Allergen Reactions  . Erythromycin Nausea And Vomiting     Family history-    Reviewed; changed as appropriate  Social history-  Reviewed; changed as appropriate

## 2017-03-07 ENCOUNTER — Other Ambulatory Visit: Payer: Self-pay

## 2017-03-07 ENCOUNTER — Telehealth: Payer: Self-pay | Admitting: Family Medicine

## 2017-03-07 NOTE — Telephone Encounter (Signed)
Patient called requesting a refill on Fusion Plus, medication was last filled by previous provider. Sent request to Dr. Raliegh Scarlet to review. MPulliam, CMA/RT(R)

## 2017-03-07 NOTE — Telephone Encounter (Signed)
Patient called and left VM wanting to speak to Opalski's nurse about an unspecified question

## 2017-03-07 NOTE — Telephone Encounter (Signed)
Called and spoke to patient requesting a refill on Fusion plus, based on chart she should still have refills.  Patient will contact [facility] and will call me back if needed. MPulliam, CMA/RT(R)

## 2017-03-12 ENCOUNTER — Other Ambulatory Visit: Payer: Self-pay | Admitting: Family Medicine

## 2017-03-12 NOTE — Telephone Encounter (Signed)
Patient called states she check w/ [facility] Rx still not showing approved--advised would forward message to nurse/ medical assistant to update status w/ PCP on whether or not to authorize  Refill of medication pt's GI doctor  (Dr. Benson Norway) originally prescribed.  --glh

## 2017-03-12 NOTE — Telephone Encounter (Signed)
Pt called states spoke w/ med asst. Regarding the Rx refill of the Probiotic/ Fusion Plus capsules ---Pt states [facility]/ [facility] but they are telling her still no provider response--frwding message to MA for f/u-- ---Pls call patient to let her know if PCP to grant refill or not. --glh

## 2017-03-13 ENCOUNTER — Other Ambulatory Visit: Payer: Self-pay

## 2017-03-13 DIAGNOSIS — D509 Iron deficiency anemia, unspecified: Secondary | ICD-10-CM

## 2017-03-13 MED ORDER — FUSION PLUS PO CAPS: 1.0000 | ORAL_CAPSULE | Freq: Every day | ORAL | 3 refills | Status: DC

## 2017-03-13 NOTE — Telephone Encounter (Signed)
Refill sent to pharmacy.  Patient notified. MPulliam, CMA/RT(R)  

## 2017-03-13 NOTE — Telephone Encounter (Signed)
See refill notes for this same medication.  Charyl Bigger, CMA

## 2017-03-13 NOTE — Telephone Encounter (Signed)
Pharmacy sent over refill request for fusion plus. Reviewed chart Dr. Raliegh Scarlet placed order in 09/2016 but it never got sent to the pharmacy.  I resent RX to the pharmacy, patient notified. MPulliam, CMA/RT(R)

## 2017-04-11 ENCOUNTER — Other Ambulatory Visit: Payer: Self-pay

## 2017-04-11 MED ORDER — CHLORTHALIDONE 25 MG PO TABS: 25.0000 mg | ORAL_TABLET | Freq: Every morning | ORAL | 1 refills | Status: DC

## 2017-04-11 NOTE — Telephone Encounter (Signed)
Pharmacy sent over refill request for chlorthalidone. Sent in medication to the pharmacy after review of chart. MPulliam, CMA/RT(R)

## 2017-04-15 ENCOUNTER — Telehealth: Payer: Self-pay | Admitting: Family Medicine

## 2017-04-15 ENCOUNTER — Other Ambulatory Visit: Payer: Self-pay

## 2017-04-15 ENCOUNTER — Ambulatory Visit: Payer: Self-pay | Admitting: Family Medicine

## 2017-04-15 DIAGNOSIS — D509 Iron deficiency anemia, unspecified: Secondary | ICD-10-CM

## 2017-04-15 MED ORDER — FUSION PLUS PO CAPS: 1.0000 | ORAL_CAPSULE | Freq: Every day | ORAL | 3 refills | Status: DC

## 2017-04-15 NOTE — Telephone Encounter (Signed)
Resent to pharmacy. MPulliam, CMA/RT(R)

## 2017-04-15 NOTE — Telephone Encounter (Signed)
Patient is requesting a refill of her fusion plus sent to [place].

## 2017-04-15 NOTE — Telephone Encounter (Signed)
Patient notified that medication was resent to the pharmacy. MPulliam, CMA/RT(R)

## 2017-04-16 ENCOUNTER — Other Ambulatory Visit: Payer: Self-pay | Admitting: Family Medicine

## 2017-04-16 DIAGNOSIS — E039 Hypothyroidism, unspecified: Secondary | ICD-10-CM

## 2017-04-17 ENCOUNTER — Telehealth: Payer: Self-pay | Admitting: Family Medicine

## 2017-04-17 NOTE — Telephone Encounter (Signed)
Pt in [place] for holiday will not be back in 76dys-- will f/u for OV upon return--glh

## 2017-04-17 NOTE — Telephone Encounter (Signed)
Noted MPulliam, CMA/RT(R)  

## 2017-05-05 ENCOUNTER — Other Ambulatory Visit: Payer: Self-pay | Admitting: Family Medicine

## 2017-05-05 DIAGNOSIS — E039 Hypothyroidism, unspecified: Secondary | ICD-10-CM

## 2017-07-01 ENCOUNTER — Telehealth: Payer: Self-pay | Admitting: Family Medicine

## 2017-07-01 ENCOUNTER — Other Ambulatory Visit: Payer: Self-pay

## 2017-07-01 DIAGNOSIS — D509 Iron deficiency anemia, unspecified: Secondary | ICD-10-CM

## 2017-07-01 DIAGNOSIS — E039 Hypothyroidism, unspecified: Secondary | ICD-10-CM

## 2017-07-01 MED ORDER — LEVOTHYROXINE SODIUM 100 MCG PO TABS: 100.0000 ug | ORAL_TABLET | Freq: Every day | ORAL | 1 refills | Status: DC

## 2017-07-01 MED ORDER — FUSION PLUS PO CAPS: 1.0000 | ORAL_CAPSULE | Freq: Every day | ORAL | 1 refills | Status: DC

## 2017-07-01 NOTE — Telephone Encounter (Signed)
Patient called requesting refill on levothyroxine and fusion plus.  Reviewed chart and sent in refills. MPulliam, CMA/RT(R)

## 2017-07-01 NOTE — Telephone Encounter (Signed)
Patient is requesting a refill of [facility] (needs to be a 90 day supply for insurance to pay). She also needs a refill of her Fusion Plus sent to [facility]. Please advise

## 2017-07-01 NOTE — Telephone Encounter (Signed)
Refills sent in after review of chart. MPulliam, CMA/RT(R)

## 2017-07-25 ENCOUNTER — Encounter: Payer: Self-pay | Admitting: Family Medicine

## 2017-07-25 ENCOUNTER — Ambulatory Visit (INDEPENDENT_AMBULATORY_CARE_PROVIDER_SITE_OTHER): Payer: BC Managed Care – PPO | Admitting: Family Medicine

## 2017-07-25 VITALS — BP 118/82 | HR 76 | Ht 61.0 in | Wt 193.3 lb

## 2017-07-25 DIAGNOSIS — E7800 Pure hypercholesterolemia, unspecified: Secondary | ICD-10-CM | POA: Diagnosis not present

## 2017-07-25 DIAGNOSIS — D509 Iron deficiency anemia, unspecified: Secondary | ICD-10-CM | POA: Diagnosis not present

## 2017-07-25 DIAGNOSIS — Z8639 Personal history of other endocrine, nutritional and metabolic disease: Secondary | ICD-10-CM | POA: Diagnosis not present

## 2017-07-25 DIAGNOSIS — Z719 Counseling, unspecified: Secondary | ICD-10-CM

## 2017-07-25 DIAGNOSIS — Z1159 Encounter for screening for other viral diseases: Secondary | ICD-10-CM | POA: Diagnosis not present

## 2017-07-25 DIAGNOSIS — Z0000 Encounter for general adult medical examination without abnormal findings: Secondary | ICD-10-CM

## 2017-07-25 DIAGNOSIS — M21619 Bunion of unspecified foot: Secondary | ICD-10-CM | POA: Diagnosis not present

## 2017-07-25 DIAGNOSIS — E039 Hypothyroidism, unspecified: Secondary | ICD-10-CM

## 2017-07-25 DIAGNOSIS — Z6835 Body mass index (BMI) 35.0-35.9, adult: Secondary | ICD-10-CM | POA: Diagnosis not present

## 2017-07-25 DIAGNOSIS — E559 Vitamin D deficiency, unspecified: Secondary | ICD-10-CM | POA: Diagnosis not present

## 2017-07-25 DIAGNOSIS — Z114 Encounter for screening for human immunodeficiency virus [HIV]: Secondary | ICD-10-CM | POA: Diagnosis not present

## 2017-07-25 DIAGNOSIS — Z1283 Encounter for screening for malignant neoplasm of skin: Secondary | ICD-10-CM

## 2017-07-25 NOTE — Patient Instructions (Addendum)
-Call your insurance and ask about alternatives to mammogram if you do not want to be exposed to radiation. Ask if they cover ultrasounds or MRI.\  -Also call your insurance and ask about information regarding the Shingrix vaccination and if they cover this in your health insurance plan.    Preventive Care for Adults, Female  A healthy lifestyle and preventive care can promote health and wellness. Preventive health guidelines for women include the following key practices.   A routine yearly physical is a good way to check with your health care provider about your health and preventive screening. It is a chance to share any concerns and updates on your health and to receive a thorough exam.   Visit your dentist for a routine exam and preventive care every 6 months. Brush your teeth twice a day and floss once a day. Good oral hygiene prevents tooth decay and gum disease.   The frequency of eye exams is based on your age, health, family medical history, use of contact lenses, and other factors. Follow your health care provider's recommendations for frequency of eye exams.   Eat a healthy diet. Foods like vegetables, fruits, whole grains, low-fat dairy products, and lean protein foods contain the nutrients you need without too many calories. Decrease your intake of foods high in solid fats, added sugars, and salt. Eat the right amount of calories for you.Get information about a proper diet from your health care provider, if necessary.   Regular physical exercise is one of the most important things you can do for your health. Most adults should get at least 150 minutes of moderate-intensity exercise (any activity that increases your heart rate and causes you to sweat) each week. In addition, most adults need muscle-strengthening exercises on 2 or more days a week.   Maintain a healthy weight. The body mass index (BMI) is a screening tool to identify possible weight problems. It provides an  estimate of body fat based on height and weight. Your health care provider can find your BMI, and can help you achieve or maintain a healthy weight.For adults 20 years and older:   - A BMI below 18.5 is considered underweight.   - A BMI of 18.5 to 24.9 is normal.   - A BMI of 25 to 29.9 is considered overweight.   - A BMI of 30 and above is considered obese.   Maintain normal blood lipids and cholesterol levels by exercising and minimizing your intake of trans and saturated fats.  Eat a balanced diet with plenty of fruit and vegetables. Blood tests for lipids and cholesterol should begin at age 61 and be repeated every 5 years minimum.  If your lipid or cholesterol levels are high, you are over 40, or you are at high risk for heart disease, you may need your cholesterol levels checked more frequently.Ongoing high lipid and cholesterol levels should be treated with medicines if diet and exercise are not working.   If you smoke, find out from your health care provider how to quit. If you do not use tobacco, do not start.   Lung cancer screening is recommended for adults aged 58-80 years who are at high risk for developing lung cancer because of a history of smoking. A yearly low-dose CT scan of the lungs is recommended for people who have at least a 30-pack-year history of smoking and are a current smoker or have quit within the past 15 years. A pack year of smoking is smoking an average  of 1 pack of cigarettes a day for 1 year (for example: 1 pack a day for 30 years or 2 packs a day for 15 years). Yearly screening should continue until the smoker has stopped smoking for at least 15 years. Yearly screening should be stopped for people who develop a health problem that would prevent them from having lung cancer treatment.   If you are pregnant, do not drink alcohol. If you are breastfeeding, be very cautious about drinking alcohol. If you are not pregnant and choose to drink alcohol, do not have more  than 1 drink per day. One drink is considered to be 12 ounces (355 mL) of beer, 5 ounces (148 mL) of wine, or 1.5 ounces (44 mL) of liquor.   Avoid use of street drugs. Do not share needles with anyone. Ask for help if you need support or instructions about stopping the use of drugs.   High blood pressure causes heart disease and increases the risk of stroke. Your blood pressure should be checked at least yearly.  Ongoing high blood pressure should be treated with medicines if weight loss and exercise do not work.   If you are 59-55 years old, ask your health care provider if you should take aspirin to prevent strokes.   Diabetes screening involves taking a blood sample to check your fasting blood sugar level. This should be done once every 3 years, after age 38, if you are within normal weight and without risk factors for diabetes. Testing should be considered at a younger age or be carried out more frequently if you are overweight and have at least 1 risk factor for diabetes.   Breast cancer screening is essential preventive care for women. You should practice "breast self-awareness."  This means understanding the normal appearance and feel of your breasts and may include breast self-examination.  Any changes detected, no matter how small, should be reported to a health care provider.  Women in their 83s and 30s should have a clinical breast exam (CBE) by a health care provider as part of a regular health exam every 1 to 3 years.  After age 67, women should have a CBE every year.  Starting at age 56, women should consider having a mammogram (breast X-ray test) every year.  Women who have a family history of breast cancer should talk to their health care provider about genetic screening.  Women at a high risk of breast cancer should talk to their health care providers about having an MRI and a mammogram every year.   -Breast cancer gene (BRCA)-related cancer risk assessment is recommended for women  who have family members with BRCA-related cancers. BRCA-related cancers include breast, ovarian, tubal, and peritoneal cancers. Having family members with these cancers may be associated with an increased risk for harmful changes (mutations) in the breast cancer genes BRCA1 and BRCA2. Results of the assessment will determine the need for genetic counseling and BRCA1 and BRCA2 testing.   The Pap test is a screening test for cervical cancer. A Pap test can show cell changes on the cervix that might become cervical cancer if left untreated. A Pap test is a procedure in which cells are obtained and examined from the lower end of the uterus (cervix).   - Women should have a Pap test starting at age 3.   - Between ages 54 and 63, Pap tests should be repeated every 2 years.   - Beginning at age 78, you should have a Pap test  every 3 years as long as the past 3 Pap tests have been normal.   - Some women have medical problems that increase the chance of getting cervical cancer. Talk to your health care provider about these problems. It is especially important to talk to your health care provider if a new problem develops soon after your last Pap test. In these cases, your health care provider may recommend more frequent screening and Pap tests.   - The above recommendations are the same for women who have or have not gotten the vaccine for human papillomavirus (HPV).   - If you had a hysterectomy for a problem that was not cancer or a condition that could lead to cancer, then you no longer need Pap tests. Even if you no longer need a Pap test, a regular exam is a good idea to make sure no other problems are starting.   - If you are between ages 27 and 86 years, and you have had normal Pap tests going back 10 years, you no longer need Pap tests. Even if you no longer need a Pap test, a regular exam is a good idea to make sure no other problems are starting.   - If you have had past treatment for cervical  cancer or a condition that could lead to cancer, you need Pap tests and screening for cancer for at least 20 years after your treatment.   - If Pap tests have been discontinued, risk factors (such as a new sexual partner) need to be reassessed to determine if screening should be resumed.   - The HPV test is an additional test that may be used for cervical cancer screening. The HPV test looks for the virus that can cause the cell changes on the cervix. The cells collected during the Pap test can be tested for HPV. The HPV test could be used to screen women aged 36 years and older, and should be used in women of any age who have unclear Pap test results. After the age of 18, women should have HPV testing at the same frequency as a Pap test.   Colorectal cancer can be detected and often prevented. Most routine colorectal cancer screening begins at the age of 50 years and continues through age 67 years. However, your health care provider may recommend screening at an earlier age if you have risk factors for colon cancer. On a yearly basis, your health care provider may provide home test kits to check for hidden blood in the stool.  Use of a small camera at the end of a tube, to directly examine the colon (sigmoidoscopy or colonoscopy), can detect the earliest forms of colorectal cancer. Talk to your health care provider about this at age 58, when routine screening begins. Direct exam of the colon should be repeated every 5 -10 years through age 34 years, unless early forms of pre-cancerous polyps or small growths are found.   People who are at an increased risk for hepatitis B should be screened for this virus. You are considered at high risk for hepatitis B if:  -You were born in a country where hepatitis B occurs often. Talk with your health care provider about which countries are considered high risk.  - Your parents were born in a high-risk country and you have not received a shot to protect against  hepatitis B (hepatitis B vaccine).  - You have HIV or AIDS.  - You use needles to inject street drugs.  -  You live with, or have sex with, someone who has Hepatitis B.  - You get hemodialysis treatment.  - You take certain medicines for conditions like cancer, organ transplantation, and autoimmune conditions.   Hepatitis C blood testing is recommended for all people born from [birth year] through 1965 and any individual with known risks for hepatitis C.   Practice safe sex. Use condoms and avoid high-risk sexual practices to reduce the spread of sexually transmitted infections (STIs). STIs include gonorrhea, chlamydia, syphilis, trichomonas, herpes, HPV, and human immunodeficiency virus (HIV). Herpes, HIV, and HPV are viral illnesses that have no cure. They can result in disability, cancer, and death. Sexually active women aged 35 years and younger should be checked for chlamydia. Older women with new or multiple partners should also be tested for chlamydia. Testing for other STIs is recommended if you are sexually active and at increased risk.   Osteoporosis is a disease in which the bones lose minerals and strength with aging. This can result in serious bone fractures or breaks. The risk of osteoporosis can be identified using a bone density scan. Women ages 69 years and over and women at risk for fractures or osteoporosis should discuss screening with their health care providers. Ask your health care provider whether you should take a calcium supplement or vitamin D to There are also several preventive steps women can take to avoid osteoporosis and resulting fractures or to keep osteoporosis from worsening. -->Recommendations include:  Eat a balanced diet high in fruits, vegetables, calcium, and vitamins.  Get enough calcium. The recommended total intake of is 1,200 mg daily; for best absorption, if taking supplements, divide doses into 250-500 mg doses throughout the day. Of the two types of calcium,  calcium carbonate is best absorbed when taken with food but calcium citrate can be taken on an empty stomach.  Get enough vitamin D. NAMS and the [employer] recommend at least 1,000 IU per day for women age 9 and over who are at risk of vitamin D deficiency. Vitamin D deficiency can be caused by inadequate sun exposure (for example, those who live in [place]).  Avoid alcohol and smoking. Heavy alcohol intake (more than 7 drinks per week) increases the risk of falls and hip fracture and women smokers tend to lose bone more rapidly and have lower bone mass than nonsmokers. Stopping smoking is one of the most important changes women can make to improve their health and decrease risk for disease.  Be physically active every day. Weight-bearing exercise (for example, fast walking, hiking, jogging, and weight training) may strengthen bones or slow the rate of bone loss that comes with aging. Balancing and muscle-strengthening exercises can reduce the risk of falling and fracture.  Consider therapeutic medications. Currently, several types of effective drugs are available. Healthcare providers can recommend the type most appropriate for each woman.  Eliminate environmental factors that may contribute to accidents. Falls cause nearly 90% of all osteoporotic fractures, so reducing this risk is an important bone-health strategy. Measures include ample lighting, removing obstructions to walking, using nonskid rugs on floors, and placing mats and/or grab bars in showers.  Be aware of medication side effects. Some common medicines make bones weaker. These include a type of steroid drug called glucocorticoids used for arthritis and asthma, some antiseizure drugs, certain sleeping pills, treatments for endometriosis, and some cancer drugs. An overactive thyroid gland or using too much thyroid hormone for an underactive thyroid can also be a problem. If you are  taking these medicines,  talk to your doctor about what you can do to help protect your bones.reduce the rate of osteoporosis.    Menopause can be associated with physical symptoms and risks. Hormone replacement therapy is available to decrease symptoms and risks. You should talk to your health care provider about whether hormone replacement therapy is right for you.   Use sunscreen. Apply sunscreen liberally and repeatedly throughout the day. You should seek shade when your shadow is shorter than you. Protect yourself by wearing long sleeves, pants, a wide-brimmed hat, and sunglasses year round, whenever you are outdoors.   Once a month, do a whole body skin exam, using a mirror to look at the skin on your back. Tell your health care provider of new moles, moles that have irregular borders, moles that are larger than a pencil eraser, or moles that have changed in shape or color.   -Stay current with required vaccines (immunizations).   Influenza vaccine. All adults should be immunized every year.  Tetanus, diphtheria, and acellular pertussis (Td, Tdap) vaccine. Pregnant women should receive 1 dose of Tdap vaccine during each pregnancy. The dose should be obtained regardless of the length of time since the last dose. Immunization is preferred during the 27th 36th week of gestation. An adult who has not previously received Tdap or who does not know her vaccine status should receive 1 dose of Tdap. This initial dose should be followed by tetanus and diphtheria toxoids (Td) booster doses every 10 years. Adults with an unknown or incomplete history of completing a 3-dose immunization series with Td-containing vaccines should begin or complete a primary immunization series including a Tdap dose. Adults should receive a Td booster every 10 years.  Varicella vaccine. An adult without evidence of immunity to varicella should receive 2 doses or a second dose if she has previously received 1 dose. Pregnant females who do not have  evidence of immunity should receive the first dose after pregnancy. This first dose should be obtained before leaving the health care facility. The second dose should be obtained 4 8 weeks after the first dose.  Human papillomavirus (HPV) vaccine. Females aged 74 26 years who have not received the vaccine previously should obtain the 3-dose series. The vaccine is not recommended for use in pregnant females. However, pregnancy testing is not needed before receiving a dose. If a female is found to be pregnant after receiving a dose, no treatment is needed. In that case, the remaining doses should be delayed until after the pregnancy. Immunization is recommended for any person with an immunocompromised condition through the age of 36 years if she did not get any or all doses earlier. During the 3-dose series, the second dose should be obtained 4 8 weeks after the first dose. The third dose should be obtained 24 weeks after the first dose and 16 weeks after the second dose.  Zoster vaccine. One dose is recommended for adults aged 36 years or older unless certain conditions are present.  Measles, mumps, and rubella (MMR) vaccine. Adults born before 2 generally are considered immune to measles and mumps. Adults born in [birth year] or later should have 1 or more doses of MMR vaccine unless there is a contraindication to the vaccine or there is laboratory evidence of immunity to each of the three diseases. A routine second dose of MMR vaccine should be obtained at least 28 days after the first dose for students attending postsecondary schools, health care workers, or international travelers. People  who received inactivated measles vaccine or an unknown type of measles vaccine during 1963 1967 should receive 2 doses of MMR vaccine. People who received inactivated mumps vaccine or an unknown type of mumps vaccine before 1979 and are at high risk for mumps infection should consider immunization with 2 doses of MMR vaccine.  For females of childbearing age, rubella immunity should be determined. If there is no evidence of immunity, females who are not pregnant should be vaccinated. If there is no evidence of immunity, females who are pregnant should delay immunization until after pregnancy. Unvaccinated health care workers born before [birth year] who lack laboratory evidence of measles, mumps, or rubella immunity or laboratory confirmation of disease should consider measles and mumps immunization with 2 doses of MMR vaccine or rubella immunization with 1 dose of MMR vaccine.  Pneumococcal 13-valent conjugate (PCV13) vaccine. When indicated, a person who is uncertain of her immunization history and has no record of immunization should receive the PCV13 vaccine. An adult aged 42 years or older who has certain medical conditions and has not been previously immunized should receive 1 dose of PCV13 vaccine. This PCV13 should be followed with a dose of pneumococcal polysaccharide (PPSV23) vaccine. The PPSV23 vaccine dose should be obtained at least 8 weeks after the dose of PCV13 vaccine. An adult aged 66 years or older who has certain medical conditions and previously received 1 or more doses of PPSV23 vaccine should receive 1 dose of PCV13. The PCV13 vaccine dose should be obtained 1 or more years after the last PPSV23 vaccine dose.  Pneumococcal polysaccharide (PPSV23) vaccine. When PCV13 is also indicated, PCV13 should be obtained first. All adults aged 75 years and older should be immunized. An adult younger than age 28 years who has certain medical conditions should be immunized. Any person who resides in a nursing home or long-term care facility should be immunized. An adult smoker should be immunized. People with an immunocompromised condition and certain other conditions should receive both PCV13 and PPSV23 vaccines. People with human immunodeficiency virus (HIV) infection should be immunized as soon as possible after diagnosis.  Immunization during chemotherapy or radiation therapy should be avoided. Routine use of PPSV23 vaccine is not recommended for American Indians, [place] Natives, or people younger than 65 years unless there are medical conditions that require PPSV23 vaccine. When indicated, people who have unknown immunization and have no record of immunization should receive PPSV23 vaccine. One-time revaccination 5 years after the first dose of PPSV23 is recommended for people aged 77 64 years who have chronic kidney failure, nephrotic syndrome, asplenia, or immunocompromised conditions. People who received 1 2 doses of PPSV23 before age 7 years should receive another dose of PPSV23 vaccine at age 44 years or later if at least 5 years have passed since the previous dose. Doses of PPSV23 are not needed for people immunized with PPSV23 at or after age 50 years.  Meningococcal vaccine. Adults with asplenia or persistent complement component deficiencies should receive 2 doses of quadrivalent meningococcal conjugate (MenACWY-D) vaccine. The doses should be obtained at least 2 months apart. Microbiologists working with certain meningococcal bacteria, [employer] recruits, people at risk during an outbreak, and people who travel to or live in countries with a high rate of meningitis should be immunized. A first-year college student up through age 28 years who is living in a residence hall should receive a dose if she did not receive a dose on or after her 16th birthday. Adults who have certain high-risk conditions  should receive one or more doses of vaccine.  Hepatitis A vaccine. Adults who wish to be protected from this disease, have certain high-risk conditions, work with hepatitis A-infected animals, work in hepatitis A research labs, or travel to or work in countries with a high rate of hepatitis A should be immunized. Adults who were previously unvaccinated and who anticipate close contact with an international adoptee during the  first 60 days after arrival in the [place] from a country with a high rate of hepatitis A should be immunized.  Hepatitis B vaccine.  Adults who wish to be protected from this disease, have certain high-risk conditions, may be exposed to blood or other infectious body fluids, are household contacts or sex partners of hepatitis B positive people, are clients or workers in certain care facilities, or travel to or work in countries with a high rate of hepatitis B should be immunized.  Haemophilus influenzae type b (Hib) vaccine. A previously unvaccinated person with asplenia or sickle cell disease or having a scheduled splenectomy should receive 1 dose of Hib vaccine. Regardless of previous immunization, a recipient of a hematopoietic stem cell transplant should receive a 3-dose series 6 12 months after her successful transplant. Hib vaccine is not recommended for adults with HIV infection.  Preventive Services / Frequency Ages 42 to 39years  Blood pressure check.** / Every 1 to 2 years.  Lipid and cholesterol check.** / Every 5 years beginning at age 68.  Clinical breast exam.** / Every 3 years for women in their 22s and 68s.  BRCA-related cancer risk assessment.** / For women who have family members with a BRCA-related cancer (breast, ovarian, tubal, or peritoneal cancers).  Pap test.** / Every 2 years from ages 50 through 70. Every 3 years starting at age 59 through age 38 or 100 with a history of 3 consecutive normal Pap tests.  HPV screening.** / Every 3 years from ages 51 through ages 39 to 2 with a history of 3 consecutive normal Pap tests.  Hepatitis C blood test.** / For any individual with known risks for hepatitis C.  Skin self-exam. / Monthly.  Influenza vaccine. / Every year.  Tetanus, diphtheria, and acellular pertussis (Tdap, Td) vaccine.** / Consult your health care provider. Pregnant women should receive 1 dose of Tdap vaccine during each pregnancy. 1 dose of Td every  10 years.  Varicella vaccine.** / Consult your health care provider. Pregnant females who do not have evidence of immunity should receive the first dose after pregnancy.  HPV vaccine. / 3 doses over 6 months, if 4 and younger. The vaccine is not recommended for use in pregnant females. However, pregnancy testing is not needed before receiving a dose.  Measles, mumps, rubella (MMR) vaccine.** / You need at least 1 dose of MMR if you were born in 1957 or later. You may also need a 2nd dose. For females of childbearing age, rubella immunity should be determined. If there is no evidence of immunity, females who are not pregnant should be vaccinated. If there is no evidence of immunity, females who are pregnant should delay immunization until after pregnancy.  Pneumococcal 13-valent conjugate (PCV13) vaccine.** / Consult your health care provider.  Pneumococcal polysaccharide (PPSV23) vaccine.** / 1 to 2 doses if you smoke cigarettes or if you have certain conditions.  Meningococcal vaccine.** / 1 dose if you are age 21 to 65 years and a Market researcher living in a residence hall, or have one of several medical conditions, you  need to get vaccinated against meningococcal disease. You may also need additional booster doses.  Hepatitis A vaccine.** / Consult your health care provider.  Hepatitis B vaccine.** / Consult your health care provider.  Haemophilus influenzae type b (Hib) vaccine.** / Consult your health care provider.  Ages 30 to 64years  Blood pressure check.** / Every 1 to 2 years.  Lipid and cholesterol check.** / Every 5 years beginning at age 63 years.  Lung cancer screening. / Every year if you are aged 69 80 years and have a 30-pack-year history of smoking and currently smoke or have quit within the past 15 years. Yearly screening is stopped once you have quit smoking for at least 15 years or develop a health problem that would prevent you from having lung cancer  treatment.  Clinical breast exam.** / Every year after age 62 years.  BRCA-related cancer risk assessment.** / For women who have family members with a BRCA-related cancer (breast, ovarian, tubal, or peritoneal cancers).  Mammogram.** / Every year beginning at age 6 years and continuing for as long as you are in good health. Consult with your health care provider.  Pap test.** / Every 3 years starting at age 61 years through age 3 or 84 years with a history of 3 consecutive normal Pap tests.  HPV screening.** / Every 3 years from ages 86 years through ages 72 to 58 years with a history of 3 consecutive normal Pap tests.  Fecal occult blood test (FOBT) of stool. / Every year beginning at age 17 years and continuing until age 28 years. You may not need to do this test if you get a colonoscopy every 10 years.  Flexible sigmoidoscopy or colonoscopy.** / Every 5 years for a flexible sigmoidoscopy or every 10 years for a colonoscopy beginning at age 14 years and continuing until age 39 years.  Hepatitis C blood test.** / For all people born from [birth year] through 1963 and any individual with known risks for hepatitis C.  Skin self-exam. / Monthly.  Influenza vaccine. / Every year.  Tetanus, diphtheria, and acellular pertussis (Tdap/Td) vaccine.** / Consult your health care provider. Pregnant women should receive 1 dose of Tdap vaccine during each pregnancy. 1 dose of Td every 10 years.  Varicella vaccine.** / Consult your health care provider. Pregnant females who do not have evidence of immunity should receive the first dose after pregnancy.  Zoster vaccine.** / 1 dose for adults aged 32 years or older.  Measles, mumps, rubella (MMR) vaccine.** / You need at least 1 dose of MMR if you were born in 1957 or later. You may also need a 2nd dose. For females of childbearing age, rubella immunity should be determined. If there is no evidence of immunity, females who are not pregnant should be  vaccinated. If there is no evidence of immunity, females who are pregnant should delay immunization until after pregnancy.  Pneumococcal 13-valent conjugate (PCV13) vaccine.** / Consult your health care provider.  Pneumococcal polysaccharide (PPSV23) vaccine.** / 1 to 2 doses if you smoke cigarettes or if you have certain conditions.  Meningococcal vaccine.** / Consult your health care provider.  Hepatitis A vaccine.** / Consult your health care provider.  Hepatitis B vaccine.** / Consult your health care provider.  Haemophilus influenzae type b (Hib) vaccine.** / Consult your health care provider.  Ages 59 years and over  Blood pressure check.** / Every 1 to 2 years.  Lipid and cholesterol check.** / Every 5 years beginning at age 16  years.  Lung cancer screening. / Every year if you are aged 46 80 years and have a 30-pack-year history of smoking and currently smoke or have quit within the past 15 years. Yearly screening is stopped once you have quit smoking for at least 15 years or develop a health problem that would prevent you from having lung cancer treatment.  Clinical breast exam.** / Every year after age 72 years.  BRCA-related cancer risk assessment.** / For women who have family members with a BRCA-related cancer (breast, ovarian, tubal, or peritoneal cancers).  Mammogram.** / Every year beginning at age 28 years and continuing for as long as you are in good health. Consult with your health care provider.  Pap test.** / Every 3 years starting at age 56 years through age 68 or 42 years with 3 consecutive normal Pap tests. Testing can be stopped between 65 and 70 years with 3 consecutive normal Pap tests and no abnormal Pap or HPV tests in the past 10 years.  HPV screening.** / Every 3 years from ages 29 years through ages 64 or 66 years with a history of 3 consecutive normal Pap tests. Testing can be stopped between 65 and 70 years with 3 consecutive normal Pap tests and no  abnormal Pap or HPV tests in the past 10 years.  Fecal occult blood test (FOBT) of stool. / Every year beginning at age 70 years and continuing until age 89 years. You may not need to do this test if you get a colonoscopy every 10 years.  Flexible sigmoidoscopy or colonoscopy.** / Every 5 years for a flexible sigmoidoscopy or every 10 years for a colonoscopy beginning at age 58 years and continuing until age 52 years.  Hepatitis C blood test.** / For all people born from [birth year] through 1965 and any individual with known risks for hepatitis C.  Osteoporosis screening.** / A one-time screening for women ages 12 years and over and women at risk for fractures or osteoporosis.  Skin self-exam. / Monthly.  Influenza vaccine. / Every year.  Tetanus, diphtheria, and acellular pertussis (Tdap/Td) vaccine.** / 1 dose of Td every 10 years.  Varicella vaccine.** / Consult your health care provider.  Zoster vaccine.** / 1 dose for adults aged 70 years or older.  Pneumococcal 13-valent conjugate (PCV13) vaccine.** / Consult your health care provider.  Pneumococcal polysaccharide (PPSV23) vaccine.** / 1 dose for all adults aged 11 years and older.  Meningococcal vaccine.** / Consult your health care provider.  Hepatitis A vaccine.** / Consult your health care provider.  Hepatitis B vaccine.** / Consult your health care provider.  Haemophilus influenzae type b (Hib) vaccine.** / Consult your health care provider. ** Family history and personal history of risk and conditions may change your health care provider's recommendations. Document Released: 06/12/2001 Document Revised: 02/04/2013  [facility] Patient Information 2014 [place], [place].   EXERCISE AND DIET:  We recommended that you start or continue a regular exercise program for good health. Regular exercise means any activity that makes your heart beat faster and makes you sweat.  We recommend exercising at least 30 minutes per day at least 3  days a week, preferably 5.  We also recommend a diet low in fat and sugar / carbohydrates.  Inactivity, poor dietary choices and obesity can cause diabetes, heart attack, stroke, and kidney damage, among others.     ALCOHOL AND SMOKING:  Women should limit their alcohol intake to no more than 7 drinks/beers/glasses of wine (combined, not each!) per  week. Moderation of alcohol intake to this level decreases your risk of breast cancer and liver damage.  ( And of course, no recreational drugs are part of a healthy lifestyle.)  Also, you should not be smoking at all or even being exposed to second hand smoke. Most people know smoking can cause cancer, and various heart and lung diseases, but did you know it also contributes to weakening of your bones?  Aging of your skin?  Yellowing of your teeth and nails?   CALCIUM AND VITAMIN D:  Adequate intake of calcium and Vitamin D are recommended.  The recommendations for exact amounts of these supplements seem to change often, but generally speaking 600 mg of calcium (either carbonate or citrate) and 800 units of Vitamin D per day seems prudent. Certain women may benefit from higher intake of Vitamin D.  If you are among these women, your doctor will have told you during your visit.     PAP SMEARS:  Pap smears, to check for cervical cancer or precancers,  have traditionally been done yearly, although recent scientific advances have shown that most women can have pap smears less often.  However, every woman still should have a physical exam from her gynecologist or primary care physician every year. It will include a breast check, inspection of the vulva and vagina to check for abnormal growths or skin changes, a visual exam of the cervix, and then an exam to evaluate the size and shape of the uterus and ovaries.  And after 65 years of age, a rectal exam is indicated to check for rectal cancers. We will also provide age appropriate advice regarding health  maintenance, like when you should have certain vaccines, screening for sexually transmitted diseases, bone density testing, colonoscopy, mammograms, etc.    MAMMOGRAMS:  All women over 90 years old should have a yearly mammogram. Many facilities now offer a "3D" mammogram, which may cost around $50 extra out of pocket. If possible,  we recommend you accept the option to have the 3D mammogram performed.  It both reduces the number of women who will be called back for extra views which then turn out to be normal, and it is better than the routine mammogram at detecting truly abnormal areas.     COLONOSCOPY:  Colonoscopy to screen for colon cancer is recommended for all women at age 31.  We know, you hate the idea of the prep.  We agree, BUT, having colon cancer and not knowing it is worse!!  Colon cancer so often starts as a polyp that can be seen and removed at colonscopy, which can quite literally save your life!  And if your first colonoscopy is normal and you have no family history of colon cancer, most women don't have to have it again for 10 years.  Once every ten years, you can do something that may end up saving your life, right?  We will be happy to help you get it scheduled when you are ready.  Be sure to check your insurance coverage so you understand how much it will cost.  It may be covered as a preventative service at no cost, but you should check your particular policy.

## 2017-07-25 NOTE — Progress Notes (Signed)
Impression and Recommendations:    1. Encounter for wellness examination   2. Health education/counseling   3. BMI 35.0-35.9,adult   4. Vitamin D deficiency   5. Iron deficiency anemia, unspecified iron deficiency anemia type   6. Hypothyroidism, unspecified type   7. History of non anemic vitamin B12 deficiency   8. Elevated LDL cholesterol level   9. Screening for skin cancer   10. Encounter for screening for HIV   11. Encounter for hepatitis C screening test for low risk patient   12. Bunion of great toe      1) Anticipatory Guidance: Discussed importance of wearing a seatbelt while driving, not texting while driving; sunscreen when outside along with yearly skin surveillance; eating a well balanced and modest diet; physical activity at least 25 minutes per day or 150 min/ week of moderate to intense activity.  2) Immunizations / Screenings / Labs:  All immunizations and screenings that patient agrees to, are up-to-date per recommendations or will be updated today.  Patient understands the needs for q 73mo dental and yearly vision screens which pt will schedule independently. Obtain CBC, CMP, HgA1c, Lipid panel, TSH and vit D when fasting if not already done recently.   3) Weight:   Discussed goal of losing even 5-10% of current body weight which would improve overall feelings of well being and improve objective health data significantly.   Improve nutrient density of diet through increasing intake of fruits and vegetables and decreasing saturated/trans fats, white flour products and refined sugar products.   4) Vitamin D deficiency 5) iron deficiency anemia 6) Hypothyroidism 7) h/o non anemic vitamin B12 deficiency 8) elevated LDL cholesterol For 4-8: Check labs. -Add HIV and Hep C.  -Recommend every 5 years for bimanual exam  -Pt sent home with stool cards. Pt deferred rectal exam.  -Recommend getting dilated eye exam yearly.    -Recommend going to dentist Q 6  months.   -Recommend going to dermatologist for regular skin screenings. Ambulatory referral given.   No orders of the defined types were placed in this encounter.   Orders Placed This Encounter  Procedures  . Vitamin B12  . Folate  . Iron and TIBC  . Ferritin  . VITAMIN D 25 Hydroxy (Vit-D Deficiency, Fractures)  . TSH  . T4, free  . Comprehensive metabolic panel  . CBC with Differential/Platelet  . Lipid panel  . Hemoglobin A1c  . HIV antibody  . Hepatitis C antibody  . Ambulatory referral to Dermatology  . Ambulatory referral to Podiatry    Gross side effects, risk and benefits, and alternatives of medications discussed with patient.  Patient is aware that all medications have potential side effects and we are unable to predict every side effect or drug-drug interaction that may occur.  Expresses verbal understanding and consents to current therapy plan and treatment regimen.  F-up preventative CPE in 1 year. F/up sooner for chronic care management as discussed and/or prn.  Please see orders placed and AVS handed out to patient at the end of our visit for further patient instructions/ counseling done pertaining to today's office visit.   This document serves as a record of services personally performed by Mellody Dance, DO. It was created on her behalf by Mayer Masker, a trained medical scribe. The creation of this record is based on the scribe's personal observations and the provider's statements to them.   I have reviewed the above medical documentation for accuracy and completeness and I  concur.  Mellody Dance 08/06/17 6:26 PM    Subjective:    Chief Complaint  Patient presents with  . Annual Exam   CC:   HPI: Deborah Petersen is a 67 y.o. female who presents to [facility] today a yearly health maintenance exam.  Health Maintenance Summary Reviewed and updated, unless pt declines services.  Colonoscopy:    UTD with Dr. Benson Norway   Tobacco History Reviewed:   Y  CT scan for screening lung CA:   NA Abdominal Ultrasound:     NA Alcohol:    No concerns, no excessive use Exercise Habits:   Not meeting AHA guidelines STD concerns:   none Drug Use:   None Birth control method:   n/a Menses regular:     N/a- pt had hysterectomy Lumps or breast concerns:      no Breast Cancer Family History:      No Bone/ DEXA scan:    NA  Skin She does not get regular skin exams.    Dental UTD- she goes Q 73months.   Vision Last exam 1 year ago. Pt has a h/o cataracts.   GI Pt takes iron supplements, fusion with probiotic that works well for her. She denies any new onset of symptoms.   Mammogram- pt deferred due to wanting to avoid radiation.  Hysterectomy (due to heavy bleeding- she had this for totally benign reasons; pt still has ovaries) was in 2003. She has had only normal results in the past. Her last pap smear was 2015.  Pt has a h/o "nonspecific" hepatitis when she was younger.      Immunization History  Administered Date(s) Administered  . Influenza,inj,Quad PF,6+ Mos 02/12/2017  . Tdap 02/12/2017    Health Maintenance  Topic Date Due  . PAP SMEAR  10/29/2018 (Originally 08/12/2016)  . MAMMOGRAM  10/28/2025 (Originally 09/10/2015)  . INFLUENZA VACCINE  11/28/2017  . COLONOSCOPY  07/03/2025  . TETANUS/TDAP  02/13/2027  . Hepatitis C Screening  Completed  . HIV Screening  Completed     Wt Readings from Last 3 Encounters:  07/25/17 193 lb 4.8 oz (87.7 kg)  03/06/17 193 lb (87.5 kg)  02/12/17 192 lb 12.8 oz (87.5 kg)   BP Readings from Last 3 Encounters:  08/06/17 129/82  07/25/17 118/82  03/06/17 119/77   Pulse Readings from Last 3 Encounters:  08/06/17 67  07/25/17 76  03/06/17 87     Past Medical History:  Diagnosis Date  . Anemia   . Brain tumor ([place]) 2005  . Dental crowns present   . Hypothyroidism   . Macromastia 10/2011  . Rheumatoid arthritis(714.0)    no current problems or  meds.  . Vitamin D deficiency       Past Surgical History:  Procedure Laterality Date  . ABDOMINAL HYSTERECTOMY     partial  . ANTERIOR AND POSTERIOR REPAIR  2001  . APPENDECTOMY    . BRAIN MENINGIOMA EXCISION  2004  . BREAST REDUCTION SURGERY  11/19/2011   Procedure: MAMMARY REDUCTION  (BREAST);  Surgeon: Cristine Polio, MD;  Location: [facility];  Service: Plastics;  Laterality: Bilateral;  . FOOT SURGERY    . HERNIA REPAIR    . KNEE ARTHROSCOPY Left 06/09/2015   Procedure: LEFT KNEE ARTHROSCOPY WITH PARTIAL MEDIAL AND LATERAL MENISECTOMY AND DEBRIDEMENT;  Surgeon: Susa Day, MD;  Location: [facility];  Service: Orthopedics;  Laterality: Left;  . KNEE SURGERY    . TONSILLECTOMY  No family history on file.    Social History   Substance and Sexual Activity  Drug Use No  ,   Social History   Substance and Sexual Activity  Alcohol Use No  ,   Social History   Tobacco Use  Smoking Status Former Smoker  Smokeless Tobacco Former Systems developer  . Quit date: 04/30/1976  ,   Social History   Substance and Sexual Activity  Sexual Activity Never  . Birth control/protection: Post-menopausal    Current Outpatient Medications on File Prior to Visit  Medication Sig Dispense Refill  . aspirin EC 81 MG tablet Take 4 tablets (325 mg total) by mouth every morning. 60 tablet 0  . BEE POLLEN PO Take 1 tablet daily by mouth.    . Cholecalciferol (VITAMIN D-3) 5000 UNITS TABS Take 1 tablet by mouth every morning.     . COD LIVER OIL PO Take 1 capsule by mouth every morning.     . Iron-FA-B Cmp-C-Biot-Probiotic (FUSION PLUS) CAPS Take 1 capsule by mouth daily. 90 capsule 1  . naproxen sodium (ANAPROX) 220 MG tablet Take 220 mg by mouth 2 (two) times daily as needed (pain.).    . Turmeric 500 MG TABS Take by mouth.     No current facility-administered medications on file prior to visit.     Allergies: Erythromycin  Review of Systems: General:   Denies fever,  chills, unexplained weight loss.  Optho/Auditory:   Denies visual changes, blurred vision/LOV Respiratory:   Denies SOB, DOE more than baseline levels.  Cardiovascular:   Denies chest pain, palpitations, new onset peripheral edema  Gastrointestinal:   Denies nausea, vomiting, diarrhea.  Genitourinary: Denies dysuria, freq/ urgency, flank pain or discharge from genitals.  Endocrine:     Denies hot or cold intolerance, polyuria, polydipsia. Musculoskeletal:   Denies unexplained myalgias, joint swelling, unexplained arthralgias, gait problems.  Skin:  Denies rash, suspicious lesions Neurological:     Denies dizziness, unexplained weakness, numbness  Psychiatric/Behavioral:   Denies mood changes, suicidal or homicidal ideations, hallucinations    Objective:    Blood pressure 118/82, pulse 76, height 5\' 1"  (1.549 m), weight 193 lb 4.8 oz (87.7 kg), SpO2 98 %. Body mass index is 36.52 kg/m. General Appearance:    Alert, cooperative, no distress, appears stated age  Head:    Normocephalic, without obvious abnormality, atraumatic  Eyes:    PERRL, conjunctiva/corneas clear, EOM's intact, fundi    benign, both eyes  Ears:    Normal TM's and external ear canals, both ears  Nose:   Nares normal, septum midline, mucosa normal, no drainage    or sinus tenderness  Throat:   Lips w/o lesion, mucosa moist, and tongue normal; teeth and   gums normal  Neck:   Supple, symmetrical, trachea midline, no adenopathy;    thyroid:  no enlargement/tenderness/nodules; no carotid   bruit or JVD  Back:     Symmetric, no curvature, ROM normal, no CVA tenderness  Lungs:     Clear to auscultation bilaterally, respirations unlabored, no       Wh/ R/ R  Chest Wall:    No tenderness or gross deformity; normal excursion   Heart:    Regular rate and rhythm, S1 and S2 normal, no murmur, rub   or gallop  Breast Exam:    No tenderness, masses, or nipple abnormality b/l; no d/c. Pt has bilateral scars consistent with  breast reduction surgery.   Abdomen:     Soft, non-tender, bowel  sounds active all four quadrants, NO   G/R/R, no masses, no organomegaly  Genitalia:    Pt deferred today.   Rectal:    Pt deferred today.   Extremities:   Extremities normal, atraumatic, no cyanosis or gross edema  Pulses:   2+ and symmetric all extremities  Skin:   Warm, dry, Skin color, texture, turgor normal, no obvious rashes or lesions Psych: No HI/SI, judgement and insight good, Euthymic mood. Full Affect.  Neurologic:   CNII-XII intact, normal strength, sensation and reflexes    Throughout

## 2017-07-26 LAB — CBC WITH DIFFERENTIAL/PLATELET
Basophils Absolute: 0 10*3/uL (ref 0.0–0.2)
Basos: 0 %
EOS (ABSOLUTE): 0.6 10*3/uL — ABNORMAL HIGH (ref 0.0–0.4)
EOS: 11 %
HEMATOCRIT: 40.6 % (ref 34.0–46.6)
HEMOGLOBIN: 13.8 g/dL (ref 11.1–15.9)
Immature Grans (Abs): 0 10*3/uL (ref 0.0–0.1)
Immature Granulocytes: 0 %
LYMPHS ABS: 1.4 10*3/uL (ref 0.7–3.1)
Lymphs: 26 %
MCH: 28.4 pg (ref 26.6–33.0)
MCHC: 34 g/dL (ref 31.5–35.7)
MCV: 84 fL (ref 79–97)
MONOCYTES: 6 %
Monocytes Absolute: 0.4 10*3/uL (ref 0.1–0.9)
Neutrophils Absolute: 3.1 10*3/uL (ref 1.4–7.0)
Neutrophils: 57 %
Platelets: 302 10*3/uL (ref 150–379)
RBC: 4.86 x10E6/uL (ref 3.77–5.28)
RDW: 14 % (ref 12.3–15.4)
WBC: 5.4 10*3/uL (ref 3.4–10.8)

## 2017-07-26 LAB — COMPREHENSIVE METABOLIC PANEL
ALBUMIN: 4.3 g/dL (ref 3.6–4.8)
ALT: 23 IU/L (ref 0–32)
AST: 27 IU/L (ref 0–40)
Albumin/Globulin Ratio: 1.7 (ref 1.2–2.2)
Alkaline Phosphatase: 99 IU/L (ref 39–117)
BUN / CREAT RATIO: 20 (ref 12–28)
BUN: 19 mg/dL (ref 8–27)
Bilirubin Total: 0.4 mg/dL (ref 0.0–1.2)
CALCIUM: 9.9 mg/dL (ref 8.7–10.3)
CO2: 25 mmol/L (ref 20–29)
CREATININE: 0.93 mg/dL (ref 0.57–1.00)
Chloride: 101 mmol/L (ref 96–106)
GFR calc Af Amer: 75 mL/min/{1.73_m2} (ref >59)
GFR, EST NON AFRICAN AMERICAN: 65 mL/min/{1.73_m2} (ref >59)
GLUCOSE: 90 mg/dL (ref 65–99)
Globulin, Total: 2.6 g/dL (ref 1.5–4.5)
Potassium: 4.1 mmol/L (ref 3.5–5.2)
Sodium: 140 mmol/L (ref 134–144)
TOTAL PROTEIN: 6.9 g/dL (ref 6.0–8.5)

## 2017-07-26 LAB — LIPID PANEL
CHOL/HDL RATIO: 2.3 ratio (ref 0.0–4.4)
Cholesterol, Total: 184 mg/dL (ref 100–199)
HDL: 80 mg/dL (ref >39)
LDL CALC: 92 mg/dL (ref 0–99)
TRIGLYCERIDES: 62 mg/dL (ref 0–149)
VLDL Cholesterol Cal: 12 mg/dL (ref 5–40)

## 2017-07-26 LAB — VITAMIN D 25 HYDROXY (VIT D DEFICIENCY, FRACTURES): Vit D, 25-Hydroxy: 54.7 ng/mL (ref 30.0–100.0)

## 2017-07-26 LAB — FERRITIN: FERRITIN: 74 ng/mL (ref 15–150)

## 2017-07-26 LAB — HEMOGLOBIN A1C
ESTIMATED AVERAGE GLUCOSE: 108 mg/dL
Hgb A1c MFr Bld: 5.4 % (ref 4.8–5.6)

## 2017-07-26 LAB — HEPATITIS C ANTIBODY

## 2017-07-26 LAB — HIV ANTIBODY (ROUTINE TESTING W REFLEX): HIV SCREEN 4TH GENERATION: NONREACTIVE

## 2017-07-26 LAB — FOLATE: Folate: >20 ng/mL (ref >3.0)

## 2017-07-26 LAB — TSH: TSH: 0.595 u[IU]/mL (ref 0.450–4.500)

## 2017-07-26 LAB — T4, FREE: Free T4: 1.49 ng/dL (ref 0.82–1.77)

## 2017-07-26 LAB — VITAMIN B12

## 2017-07-26 LAB — IRON AND TIBC
Iron Saturation: 37 % (ref 15–55)
Iron: 117 ug/dL (ref 27–139)
TIBC: 317 ug/dL (ref 250–450)
UIBC: 200 ug/dL (ref 118–369)

## 2017-07-30 ENCOUNTER — Other Ambulatory Visit: Payer: Self-pay

## 2017-07-30 MED ORDER — CHLORTHALIDONE 25 MG PO TABS: 25.0000 mg | ORAL_TABLET | Freq: Every morning | ORAL | 1 refills | Status: DC

## 2017-07-30 NOTE — Telephone Encounter (Signed)
Pharmacy sent over refill request for chlorthalidone.  Reviewed patient's chart and sent refill into the pharmacy. MP[place]iam, CMA/RT(R)

## 2017-07-31 ENCOUNTER — Other Ambulatory Visit (INDEPENDENT_AMBULATORY_CARE_PROVIDER_SITE_OTHER): Payer: BC Managed Care – PPO

## 2017-07-31 ENCOUNTER — Other Ambulatory Visit: Payer: Self-pay

## 2017-07-31 DIAGNOSIS — E039 Hypothyroidism, unspecified: Secondary | ICD-10-CM

## 2017-07-31 DIAGNOSIS — Z1211 Encounter for screening for malignant neoplasm of colon: Secondary | ICD-10-CM

## 2017-07-31 LAB — POC HEMOCCULT BLD/STL (HOME/3-CARD/SCREEN)
Card #3 Fecal Occult Blood, POC: POSITIVE
FECAL OCCULT BLD: NEGATIVE
Fecal Occult Blood, POC: NEGATIVE

## 2017-07-31 MED ORDER — LEVOTHYROXINE SODIUM 100 MCG PO TABS: 100.0000 ug | ORAL_TABLET | Freq: Every day | ORAL | 1 refills | Status: DC

## 2017-07-31 NOTE — Telephone Encounter (Signed)
Patient requesting refill on Levothyroxine to be sent to mail order pharmacy. Reviewed chart sent in refill and canceled all refills at local pharmacy. MPulliam, CMA/RT(R)

## 2017-07-31 NOTE — Progress Notes (Signed)
Patient brought in IFOB cards.  MPulliam, CMA/RT(R)

## 2017-08-06 ENCOUNTER — Ambulatory Visit (INDEPENDENT_AMBULATORY_CARE_PROVIDER_SITE_OTHER): Payer: BC Managed Care – PPO

## 2017-08-06 ENCOUNTER — Encounter: Payer: Self-pay | Admitting: Sports Medicine

## 2017-08-06 ENCOUNTER — Ambulatory Visit: Payer: BC Managed Care – PPO | Admitting: Sports Medicine

## 2017-08-06 VITALS — BP 129/82 | HR 67 | Resp 16

## 2017-08-06 DIAGNOSIS — M2011 Hallux valgus (acquired), right foot: Secondary | ICD-10-CM

## 2017-08-06 DIAGNOSIS — M79671 Pain in right foot: Secondary | ICD-10-CM | POA: Diagnosis not present

## 2017-08-06 NOTE — Patient Instructions (Addendum)
Bunion A bunion is a bump on the base of the big toe that forms when the bones of the big toe joint move out of position. Bunions may be small at first, but they often get larger over time. The can make walking painful. What are the causes? A bunion may be caused by:  Wearing narrow or pointed shoes that force the big toe to press against the other toes.  Abnormal foot development that causes the foot to roll inward (pronate).  Changes in the foot that are caused by certain diseases, such as rheumatoid arthritis and polio.  A foot injury.  What increases the risk? The following factors may make you more likely to develop this condition:  Wearing shoes that squeeze the toes together.  Having certain diseases, such as: ? Rheumatoid arthritis. ? Polio. ? Cerebral palsy.  Having family members who have bunions.  Being born with a foot deformity, such as flat feet or low arches.  Doing activities that put a lot of pressure on the feet, such as ballet dancing.  What are the signs or symptoms? The main symptom of a bunion is a noticeable bump on the big toe. Other symptoms may include:  Pain.  Swelling around the big toe.  Redness and inflammation.  Thick or hardened skin on the big toe or between the toes.  Stiffness or loss of motion in the big toe.  Trouble with walking.  How is this diagnosed? A bunion may be diagnosed based on your symptoms, medical history, and activities. You may have tests, such as:  X-rays. These allow your health care provider to check the position of the bones in your foot and look for damage to your joint. They also help your health care provider to determine the severity of your bunion and the best way to treat it.  Joint aspiration. In this test, a sample of fluid is removed from the toe joint. This test, which may be done if you are in a lot of pain, helps to rule out diseases that cause painful swelling of the joints, such as  arthritis.  How is this treated? There is no cure for a bunion, but treatment can help to prevent a bunion from getting worse. Treatment depends on the severity of your symptoms. Your health care provider may recommend:  Wearing shoes that have a wide toe box.  Using bunion pads to cushion the affected area.  Taping your toes together to keep them in a normal position.  Placing a device inside your shoe (orthotics) to help reduce pressure on your toe joint.  Taking medicine to ease pain, inflammation, and swelling.  Applying heat or ice to the affected area.  Doing stretching exercises.  Surgery to remove scar tissue and move the toes back into their normal position. This treatment is rare.  Follow these instructions at home:  Support your toe joint with proper footwear, shoe padding, or taping as told by your health care provider.  Take over-the-counter and prescription medicines only as told by your health care provider.  If directed, apply ice to the injured area: ? Put ice in a plastic bag. ? Place a towel between your skin and the bag. ? Leave the ice on for 20 minutes, 2-3 times per day.  If directed, apply heat to the affected area before you exercise. Use the heat source that your health care provider recommends, such as a moist heat pack or a heating pad. ? Place a towel between your   skin and the heat source. ? Leave the heat on for 20-30 minutes. ? Remove the heat if your skin turns bright red. This is especially important if you are unable to feel pain, heat, or cold. You may have a greater risk of getting burned.  Do exercises as told by your health care provider.  Keep all follow-up visits as told by your health care provider. Contact a health care provider if:  Your symptoms get worse.  Your symptoms do not improve in 2 weeks. Get help right away if:  You have severe pain and trouble with walking. This information is not intended to replace advice given  to you by your health care provider. Make sure you discuss any questions you have with your health care provider. Document Released: 04/16/2005 Document Revised: 09/22/2015 Document Reviewed: 11/14/2014 Elsevier Interactive Patient Education  2018 [employer]  Pre-Operative Instructions  Congratulations, you have decided to take an important step towards improving your quality of life.  You can be assured that the doctors and staff at [facility] will be with you every step of the way.  Here are some important things you should know:  1. Plan to be at the surgery center/hospital at least 1 (one) hour prior to your scheduled time, unless otherwise directed by the surgical center/hospital staff.  You must have a responsible adult accompany you, remain during the surgery and drive you home.  Make sure you have directions to the surgical center/hospital to ensure you arrive on time. 2. If you are having surgery at [facility] or [facility] hospitals, you will need a copy of your medical history and physical form from your family physician within one month prior to the date of surgery. We will give you a form for your primary physician to complete.  3. We make every effort to accommodate the date you request for surgery.  However, there are times where surgery dates or times have to be moved.  We will contact you as soon as possible if a change in schedule is required.   4. No aspirin/ibuprofen for one week before surgery.  If you are on aspirin, any non-steroidal anti-inflammatory medications (Mobic, Aleve, Ibuprofen) should not be taken seven (7) days prior to your surgery.  You make take Tylenol for pain prior to surgery.  5. Medications - If you are taking daily heart and blood pressure medications, seizure, reflux, allergy, asthma, anxiety, pain or diabetes medications, make sure you notify the surgery center/hospital before the day of surgery so they can tell you which medications you should  take or avoid the day of surgery. 6. No food or drink after midnight the night before surgery unless directed otherwise by surgical center/hospital staff. 7. No alcoholic beverages 24-hours prior to surgery.  No smoking 24-hours prior or 24-hours after surgery. 8. Wear loose pants or shorts. They should be loose enough to fit over bandages, boots, and casts. 9. Don't wear slip-on shoes. Sneakers are preferred. 10. Bring your boot with you to the surgery center/hospital.  Also bring crutches or a walker if your physician has prescribed it for you.  If you do not have this equipment, it will be provided for you after surgery. 11. If you have not been contacted by the surgery center/hospital by the day before your surgery, call to confirm the date and time of your surgery. 12. Leave-time from work may vary depending on the type of surgery you have.  Appropriate arrangements should be made prior   to surgery with your employer. 13. Prescriptions will be provided immediately following surgery by your doctor.  Fill these as soon as possible after surgery and take the medication as directed. Pain medications will not be refilled on weekends and must be approved by the doctor. 14. Remove nail polish on the operative foot and avoid getting pedicures prior to surgery. 15. Wash the night before surgery.  The night before surgery wash the foot and leg well with water and the antibacterial soap provided. Be sure to pay special attention to beneath the toenails and in between the toes.  Wash for at least three (3) minutes. Rinse thoroughly with water and dry well with a towel.  Perform this wash unless told not to do so by your physician.  Enclosed: 1 Ice pack (please put in freezer the night before surgery)   1 Hibiclens skin cleaner   Pre-op instructions  If you have any questions regarding the instructions, please do not hesitate to call our office.  [place]: [address], [place], [place] [address] --  [phone]  [place]: [address] [place], [place] [address] -- [phone]  [place]: [address]  [place], [place] [address] -- [phone]  [place]: [address], [address], [place],  [address] -- [phone]  Website: [email or web address]  

## 2017-08-06 NOTE — Progress Notes (Signed)
Subjective: Deborah Petersen is a 67 y.o. female patient who presents to office for evaluation of Right bunion pain. Patient complains of progressive pain especially over the last year in the Right foot that starts as pain over the bump with direct pressure and range of motion; patient now has difficulty fitting shoes comfortably. Pain is now interferring with daily activities.  Patient has also tried change of shoes and padding with no relief. Patient denies any other pedal complaints.   Review of Systems  Musculoskeletal: Positive for joint pain.  All other systems reviewed and are negative.    Patient Active Problem List   Diagnosis Date Noted  . Elevated LDL cholesterol level 07/25/2017  . Cervical pain (neck) 02/12/2017  . Hypothyroidism 10/03/2016  . Cyst of meniscus of left knee- s/p sx Dr Tonita Cong- [facility] 10/03/2016  . S/P hysterectomy- no cervix 10/03/2016  . History of non anemic vitamin B12 deficiency 10/03/2016  . BMI 35.0-35.9,adult 10/03/2016  . h/o RA (rheumatoid arthritis) - cured by faith and prayer   . Dental crowns present   . 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 Vitamin D deficiency   . Fe Def Anemia   . Macromastia 10/29/2011    Current Outpatient Medications on File Prior to Visit  Medication Sig Dispense Refill  . aspirin EC 81 MG tablet Take 4 tablets (325 mg total) by mouth every morning. 60 tablet 0  . BEE POLLEN PO Take 1 tablet daily by mouth.    . chlorthalidone (HYGROTON) 25 MG tablet Take 1 tablet (25 mg total) by mouth every morning. 90 tablet 1  . Cholecalciferol (VITAMIN D-3) 5000 UNITS TABS Take 1 tablet by mouth every morning.     . COD LIVER OIL PO Take 1 capsule by mouth every morning.     . Iron-FA-B Cmp-C-Biot-Probiotic (FUSION PLUS) CAPS Take 1 capsule by mouth daily. 90 capsule 1  . levothyroxine (SYNTHROID, LEVOTHROID) 100 MCG tablet Take 1 tablet (100 mcg total) by mouth daily before breakfast. Patient  needs office visit for further refills. 90 tablet 1  . naproxen sodium (ANAPROX) 220 MG tablet Take 220 mg by mouth 2 (two) times daily as needed (pain.).    . Turmeric 500 MG TABS Take by mouth.     No current facility-administered medications on file prior to visit.     Allergies  Allergen Reactions  . Erythromycin Nausea And Vomiting    Objective:  General: Alert and oriented x3 in no acute distress  Dermatology: No open lesions bilateral lower extremities, no webspace macerations, no ecchymosis bilateral, all nails x 10 are well manicured.  Vascular: Dorsalis Pedis and Posterior Tibial pedal pulses 2/4, Capillary Fill Time 3 seconds, (+) pedal hair growth bilateral, no edema bilateral lower extremities, Temperature gradient within normal limits.  Neurology: Gross sensation intact via light touch bilateral.   Musculoskeletal: Mild tenderness with palpation right bunion deformity, no limitation or crepitus with range of motion, deformity reducible, tracking not trackbound, there is no 1st ray hypermobility noted bilateral. Midtarsal, Subtalar joint, and ankle joint range of motion is within normal limits. On weightbearing exam, there is decreased 1st MTPJ rom Right with functional limitus noted, there is medial arch collapse Right> Left on weightbearing, rearfoot slight varus/valgus, forefoot slight abduction with HAV deformity supported on ground with no second toe crossover deformity noted.   Gait: Non-Antalgic gait with increased medial arch collapse and pronatory influence noted on Right> Left foot with medial  1st MTPJ roll-off at toe-off, heel off within normal limits.   Xrays  Right/Left Foot    Impression: Intermetatarsal angle above normal limits supportive of bunion       Assessment and Plan: Problem List Items Addressed This Visit    None    Visit Diagnoses    Hav (hallux abducto valgus), right    -  Primary   Relevant Orders   DG Foot Complete Right   Right foot pain            -Complete examination performed -Xrays reviewed -Discussed treatement options; discussed HAV deformity;conservative and  Surgical management; risks, benefits, alternatives discussed. All patient's questions answered. -Patient opt for surgical management. Consent obtained for Right bunionectomy with internal screw fixation. Pre and Post op course explained. Risks, benefits, alternatives explained. No guarantees given or implied. Surgical booking slip submitted and provided patient with Surgical packet and info for [place]. -Dispensed CAM Walker to use post op recommend 2 weeks NWB/limited pressure; will dispense crutches at surgical center  -Recommend continue with good supportive shoes and inserts meanwhile.  -Patient to return to office after surgery or sooner if condition worsens.  Landis Martins, DPM

## 2017-08-15 ENCOUNTER — Ambulatory Visit: Payer: Self-pay | Admitting: Family Medicine

## 2017-08-15 ENCOUNTER — Telehealth: Payer: Self-pay | Admitting: *Deleted

## 2017-08-15 NOTE — Telephone Encounter (Signed)
Pt states she is sick and cancelling the 08/19/2017 surgery, and has called [place] and was instructed to call [facility].

## 2017-08-19 ENCOUNTER — Ambulatory Visit: Payer: BC Managed Care – PPO | Admitting: Family Medicine

## 2017-08-19 ENCOUNTER — Encounter: Payer: Self-pay | Admitting: Family Medicine

## 2017-08-19 ENCOUNTER — Telehealth: Payer: Self-pay | Admitting: Family Medicine

## 2017-08-19 VITALS — BP 119/81 | HR 85 | Temp 98.8°F | Ht 61.0 in | Wt 185.9 lb

## 2017-08-19 DIAGNOSIS — R05 Cough: Secondary | ICD-10-CM | POA: Diagnosis not present

## 2017-08-19 DIAGNOSIS — B9689 Other specified bacterial agents as the cause of diseases classified elsewhere: Secondary | ICD-10-CM | POA: Diagnosis not present

## 2017-08-19 DIAGNOSIS — J019 Acute sinusitis, unspecified: Secondary | ICD-10-CM

## 2017-08-19 DIAGNOSIS — J40 Bronchitis, not specified as acute or chronic: Secondary | ICD-10-CM | POA: Diagnosis not present

## 2017-08-19 DIAGNOSIS — R058 Other specified cough: Secondary | ICD-10-CM

## 2017-08-19 DIAGNOSIS — F439 Reaction to severe stress, unspecified: Secondary | ICD-10-CM

## 2017-08-19 DIAGNOSIS — F430 Acute stress reaction: Secondary | ICD-10-CM

## 2017-08-19 MED ORDER — PREDNISONE 20 MG PO TABS: ORAL_TABLET | ORAL | 0 refills | Status: DC

## 2017-08-19 MED ORDER — HYDROCOD POLST-CPM POLST ER 10-8 MG/5ML PO SUER: 5.0000 mL | Freq: Two times a day (BID) | ORAL | 0 refills | Status: DC | PRN

## 2017-08-19 MED ORDER — AMOXICILLIN-POT CLAVULANATE 875-125 MG PO TABS: 1.0000 | ORAL_TABLET | Freq: Two times a day (BID) | ORAL | 0 refills | Status: DC

## 2017-08-19 NOTE — Telephone Encounter (Signed)
Patient was never taking amoxicillin.  She brought in her medications which she got from a urgent care when she was in [place] for a family funeral.  It was Ladona Ridgel and Flonase that she was given.

## 2017-08-19 NOTE — Progress Notes (Signed)
Acute Care Office visit  Assessment and plan:  1. Acute bacterial rhinosinusitis   2. Bronchitis   3. Productive cough   4. Acute stress reaction with predominately emotional disturbance- death of a great niece-age 65    For diagnosis codes 1-3:  -start augmentin.  -start prednisone taper. -Start tussionex.  -No suicidal or homicidal ideation, we discussed stress management techniques -Push fluids, get plenty of rest. -eat clear broths and drink all your calories until you are able to swallow foods.   - Viral vs Allergic vs Bacterial causes for pt's symptoms reveiwed.    - Supportive care and various OTC medications discussed in addition to any prescribed. - Call or RTC if new symptoms, or if no improvement or worse over next several days.     Gross side effects, risk and benefits, and alternatives of medications discussed with patient.  Patient is aware that all medications have potential side effects and we are unable to predict every sideeffect or drug-drug interaction that may occur.  Expresses verbal understanding and consents to current therapy plan and treatment regiment.   Education and routine counseling performed. Handouts provided.  Anticipatory guidance and routine counseling done re: condition, txmnt options and need for follow up. All questions of patient's were answered.  Return if symptoms worsen or fail to improve, for Follow-up for chronic care as previously discussed..  Please see AVS handed out to patient at the end of our visit for additional patient instructions/ counseling done pertaining to today's office visit.  Note: This document was partially repared using Dragon voice recognition software and may include unintentional dictation errors.   This document serves as a record of services personally performed by Mellody Dance, DO. It was created on her behalf by Mayer Masker, a trained medical scribe. The creation of this record is based on the  scribe's personal observations and the provider's statements to them.   I have reviewed the above medical documentation for accuracy and completeness and I concur.  Mellody Dance 08/19/17 12:07 PM    Subjective:    Chief Complaint  Patient presents with  . Sinus Problem    sinus congestion, fevers, productive cough    HPI:  Pt presents with Sx for 10-14 days.   C/o: sore throat (difficulty swallowing due to pain), sneezing, productive cough, night sweats and subjective fevers, chills, generalized myalgias and cramping in her legs and abdomen, soreness in her chest due to coughing. She feels SOB.  She was seen in urgent care in [place] where she was traveling due to a death in the family. She has been under tremendous stress and is grieving. This was on 08-15-17. She was given tessalon perls and flonase. She has been taking OTC medications. She was negative for strep and flu in their office.  She also states she was eating a piece of fish when she felt it get stuck in her throat. As a result, she now has difficulty swallowing due to pain. She cannot eat or drink due to pain, and her last meal was 5 days ago but she has been able to drink. She is unable to keep food down.   She thought it was allergies at first but since it has worsened, she came into the office.   Denies: wheezing. She denies chest pain in one part of her chest when she cough.     For symptoms patient has tried:  tessal perls, flonase, and other OTC medications.  Overall getting:  worse   Patient Care Team    Relationship Specialty Notifications Start End  Mellody Dance, DO PCP - General Family Medicine  10/03/16   Carol Ada, MD Consulting Physician Gastroenterology  10/03/16   Susa Day, MD Consulting Physician Orthopedic Surgery  10/03/16     Past medical history, Surgical history, Family history reviewed and noted below, Social history, Allergies, and Medications have been entered into the medical  record, reviewed and changed as needed.   Allergies  Allergen Reactions  . Erythromycin Nausea And Vomiting    Review of Systems: - see above HPI for pertinent positives General:   No wt loss Pulm:   No DIB, pleuritic chest pain Card:  No CP, palpitations Abd:  No n/v/d or pain Ext:  No inc edema from baseline   Objective:   Blood pressure 119/81, pulse 85, temperature 98.8 F (37.1 C), height 5\' 1"  (1.549 m), weight 185 lb 14.4 oz (84.3 kg), SpO2 96 %. Body mass index is 35.13 kg/m. General: Well Developed, well nourished, appropriate for stated age.  Neuro: Alert and oriented x3, extra-ocular muscles intact, sensation grossly intact.  HEENT: Normocephalic, atraumatic, pupils equal round reactive to light, neck supple, no masses, no painful lymphadenopathy, TM's intact B/L, no acute findings. Nares- patent, clear d/c, OP- clear, mild erythema, no petechiae. No TTP sinuses  Skin: Warm and dry, no gross rash. Cardiac: RRR, S1 S2,  no murmurs rubs or gallops.  Respiratory: ECTA B/L and A/P, Not using accessory muscles, speaking in full sentences- unlabored. -No wheezing or other localized abnormalities. Diffuse rhonchi present throughout. Vascular:  No gross lower ext edema, cap RF less 2 sec. Psych: No HI/SI, judgement and insight good, Dysthymic mood and tearful. Full Affect.

## 2017-08-19 NOTE — Telephone Encounter (Signed)
Please advise 

## 2017-08-19 NOTE — Patient Instructions (Signed)
   You can use over-the-counter afrin nasal spray for up to 3 days (NO longer than that) which will help acutely with nasal drainage/ congestion short term.   Also, sterile saline nasal rinses, such as Milta Deiters med or AYR sinus rinses, can be very helpful and should be done twice daily- especially throughout the allergy season.   Remember you should use distilled water or previously boiled water to do this.  Then you may use over-the-counter Flonase 1 spray each nostril twice daily after sinus rinses.   You can also use an over the counter cold and flu medication such as Tylenol Severe Cold and Sinus/Flu or Dayquil, Nyquil and the like, which will help with cough, congestion, headache/ pain, fevers/chills etc.  Please note, if you being treated for hypertension or have high blood pressure, you should be using the cold meds designated "HBP".    Wash your hands frequently, as you did not want to get those around you sick as well. Never sneeze or cough on others.  And you should not be going to school or work if you are running a temperature of 100.5 or more on two separate occasions.   Drink plenty of fluids and stay hydrated, especially if you are running fevers.  We don't know why, but chicken soup also helps, try it! :)

## 2017-08-19 NOTE — Telephone Encounter (Signed)
  I saw her today.  Please see my note for further details

## 2017-08-19 NOTE — Telephone Encounter (Signed)
Patient said she is having trouble taking the amoxicillin, she states it gets stuck in her throat and was hoping for something else maybe a liquid. Please advise

## 2017-08-20 ENCOUNTER — Telehealth: Payer: Self-pay | Admitting: Family Medicine

## 2017-08-20 DIAGNOSIS — J329 Chronic sinusitis, unspecified: Secondary | ICD-10-CM

## 2017-08-20 MED ORDER — AMOXICILLIN-POT CLAVULANATE 250-62.5 MG/5ML PO SUSR: 10.0000 mL | Freq: Two times a day (BID) | ORAL | 0 refills | Status: DC

## 2017-08-20 NOTE — Telephone Encounter (Signed)
Pt called stating she was unable to swallow the:   Outpatient Medication Detail    Disp Refills Start End   amoxicillin-clavulanate (AUGMENTIN) 875-125 MG tablet 20 tablet 0 08/19/2017 08/29/2017   Sig - Route: Take 1 tablet by mouth 2 (two) times daily for 10 days. - Oral   Sent to pharmacy as: amoxicillin-clavulanate (AUGMENTIN) 875-125 MG tablet   E-Prescribing Status: Receipt confirmed by pharmacy (08/19/2017 10:49 AM EDT)    And request provider prescribe contact her & also request a  ENT referral .  ---Forwarding message to medical assistant.  --Dion Body

## 2017-08-20 NOTE — Telephone Encounter (Signed)
Gottcha. Sorry.  Please see below: Suspension soln:  augmentin 250 mg\ 62.5 mg per 5 mL Pt to take 83ml po BID for 10 days

## 2017-08-20 NOTE — Telephone Encounter (Signed)
Augmentin suspension dosages available will not convert to the tablet dosage that was prescribed.  Please place orders for what ou wish for the patient to take as a substitute for the pill formulation.  Charyl Bigger, CMA

## 2017-08-20 NOTE — Telephone Encounter (Signed)
"  I'm calling to reschedule my surgery that I had to cancel yesterday.  I had to go to [place] for my 65 year old niece's funeral."  What date would you like?  "Does she have anything Monday of next week?"  Yes, she can do it on Monday, April 29.  "I'd like to do it early morning if I can."  I can't guarantee that it will be early morning.  "Okay, thank you."

## 2017-08-20 NOTE — Addendum Note (Signed)
Addended by: Fonnie Mu on: 08/20/2017 03:22 PM   Modules accepted: Orders

## 2017-08-20 NOTE — Telephone Encounter (Signed)
  Deborah Petersen as we discussed please convert this to oral solution of Augmentin.  Same dose and sig  -Please put in a ENT referral per her request as well.  Please make sure you put on the referral that the patient requested this ( as I really do not see why she needs it but she has pharyngitis and wants to see an ENT doctor which we will oblige )

## 2017-08-22 DIAGNOSIS — J029 Acute pharyngitis, unspecified: Secondary | ICD-10-CM | POA: Insufficient documentation

## 2017-08-22 DIAGNOSIS — K219 Gastro-esophageal reflux disease without esophagitis: Secondary | ICD-10-CM | POA: Insufficient documentation

## 2017-08-26 ENCOUNTER — Encounter: Payer: Self-pay | Admitting: Sports Medicine

## 2017-08-26 ENCOUNTER — Telehealth: Payer: Self-pay | Admitting: Sports Medicine

## 2017-08-26 DIAGNOSIS — M2011 Hallux valgus (acquired), right foot: Secondary | ICD-10-CM | POA: Diagnosis not present

## 2017-08-26 DIAGNOSIS — Z9889 Other specified postprocedural states: Secondary | ICD-10-CM

## 2017-08-26 NOTE — Telephone Encounter (Signed)
Patient is interested in the knee scooter. Just had surgery this morning (08/26/17). If you can call her back at [phone]

## 2017-08-26 NOTE — Telephone Encounter (Signed)
Faxed orders to [facility] and [facility] to [facility].

## 2017-08-26 NOTE — Addendum Note (Signed)
Addended by: Harriett Sine D on: 08/26/2017 01:56 PM   Modules accepted: Orders

## 2017-08-27 ENCOUNTER — Encounter: Payer: BC Managed Care – PPO | Admitting: Sports Medicine

## 2017-08-28 ENCOUNTER — Telehealth: Payer: Self-pay | Admitting: Sports Medicine

## 2017-08-28 NOTE — Telephone Encounter (Signed)
Post op check phone call made to patient. Patient denies any pain states that her block wore off last night and took Aleve but otherwise she is doing good. Advised patient that the order for her knee scooter was placed and that she should be getting a call soon from [facility]. Patient expressed understanding and is to follow up as scheduled on next week in office for continued post op care. -Dr. Cannon Kettle

## 2017-09-03 ENCOUNTER — Ambulatory Visit (INDEPENDENT_AMBULATORY_CARE_PROVIDER_SITE_OTHER): Payer: BC Managed Care – PPO

## 2017-09-03 ENCOUNTER — Encounter: Payer: Self-pay | Admitting: Sports Medicine

## 2017-09-03 ENCOUNTER — Encounter: Payer: BC Managed Care – PPO | Admitting: Sports Medicine

## 2017-09-03 ENCOUNTER — Ambulatory Visit (INDEPENDENT_AMBULATORY_CARE_PROVIDER_SITE_OTHER): Payer: BC Managed Care – PPO | Admitting: Sports Medicine

## 2017-09-03 VITALS — BP 115/66 | HR 69

## 2017-09-03 DIAGNOSIS — Z9889 Other specified postprocedural states: Secondary | ICD-10-CM

## 2017-09-03 DIAGNOSIS — M2011 Hallux valgus (acquired), right foot: Secondary | ICD-10-CM | POA: Diagnosis not present

## 2017-09-03 DIAGNOSIS — M79671 Pain in right foot: Secondary | ICD-10-CM

## 2017-09-03 NOTE — Progress Notes (Signed)
-Subjective: Deborah Petersen is a 67 y.o. female patient seen today in office for POV #1 (DOS 08-26-17), S/P R Austin Bunionectomy. Patient denies pain at surgical site, denies calf pain, denies headache, chest pain, shortness of breath, nausea, vomiting, fever, or chills. Patient states that she is doing well and is only taking Aleve. No other issues noted.   Patient Active Problem List   Diagnosis Date Noted  . Elevated LDL cholesterol level 07/25/2017  . Cervical pain (neck) 02/12/2017  . Hypothyroidism 10/03/2016  . Cyst of meniscus of left knee- s/p sx Dr Tonita Cong- [facility] 10/03/2016  . S/P hysterectomy- no cervix 10/03/2016  . History of non anemic vitamin B12 deficiency 10/03/2016  . BMI 35.0-35.9,adult 10/03/2016  . h/o RA (rheumatoid arthritis) - cured by faith and prayer   . Dental crowns present   . 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 Vitamin D deficiency   . Fe Def Anemia   . Macromastia 10/29/2011    Current Outpatient Medications on File Prior to Visit  Medication Sig Dispense Refill  . amoxicillin-clavulanate (AUGMENTIN) 250-62.5 MG/5ML suspension Take 10 mLs by mouth 2 (two) times daily. 200 mL 0  . aspirin EC 81 MG tablet Take 4 tablets (325 mg total) by mouth every morning. 60 tablet 0  . BEE POLLEN PO Take 1 tablet daily by mouth.    . chlorpheniramine-HYDROcodone (TUSSIONEX) 10-8 MG/5ML SUER Take 5 mLs by mouth every 12 (twelve) hours as needed for cough (cough, will cause drowsiness.). 200 mL 0  . chlorthalidone (HYGROTON) 25 MG tablet Take 1 tablet (25 mg total) by mouth every morning. 90 tablet 1  . Cholecalciferol (VITAMIN D-3) 5000 UNITS TABS Take 1 tablet by mouth every morning.     . COD LIVER OIL PO Take 1 capsule by mouth every morning.     . Iron-FA-B Cmp-C-Biot-Probiotic (FUSION PLUS) CAPS Take 1 capsule by mouth daily. 90 capsule 1  . levothyroxine (SYNTHROID, LEVOTHROID) 100 MCG tablet Take 1 tablet (100 mcg total) by  mouth daily before breakfast. Patient needs office visit for further refills. 90 tablet 1  . naproxen sodium (ANAPROX) 220 MG tablet Take 220 mg by mouth 2 (two) times daily as needed (pain.).    Marland Kitchen predniSONE (DELTASONE) 20 MG tablet Take 3 pills a day for 2 days, 2 pills a day for 2 days, 1 pill a day for 2 days then one half pill a day for 2 days then off 14 tablet 0  . Turmeric 500 MG TABS Take by mouth.     No current facility-administered medications on file prior to visit.     Allergies  Allergen Reactions  . Erythromycin Nausea And Vomiting    Objective: There were no vitals filed for this visit.  General: No acute distress, AAOx3  Right foot: Sutures intact with no gapping or dehiscence at surgical site, mild swelling and bruising to right foot, no erythema, no warmth, no drainage, no signs of infection noted, Capillary fill time <3 seconds in all digits, gross sensation present via light touch to right foot. No pain or crepitation with range of motion right foot.  No pain with calf compression.   Post Op Xray, Right foot: 1st met in excellent alignment and position. Osteotomy site healing. Hardware intact. Soft tissue swelling within normal limits for post op status.   Assessment and Plan:  Problem List Items Addressed This Visit    None    Visit Diagnoses  Hav (hallux abducto valgus), right    -  Primary   Relevant Orders   DG Foot Complete Right   Post-operative state       Right foot pain           -Patient seen and evaluated -Xrays reviewed  -Applied dry sterile dressing to surgical site rightt foot secured with ACE wrap and stockinet  -Advised patient to make sure to keep dressings clean, dry, and intact to right surgical site, removing the ACE as needed  -Advised patient to continue with CAM boot and limited pressure with crutches until next office visit will transition to full wb with boot   -Advised patient to limit activity to necessity  -Advised patient to  ice and elevate as necessary and Aleve PRN -Will plan for suture removal at next office visit. In the meantime, patient to call office if any issues or problems arise.   Landis Martins, DPM

## 2017-09-10 ENCOUNTER — Other Ambulatory Visit: Payer: Self-pay

## 2017-09-10 ENCOUNTER — Encounter: Payer: Self-pay | Admitting: Sports Medicine

## 2017-09-10 ENCOUNTER — Ambulatory Visit (INDEPENDENT_AMBULATORY_CARE_PROVIDER_SITE_OTHER): Payer: BC Managed Care – PPO | Admitting: Sports Medicine

## 2017-09-10 DIAGNOSIS — Z9889 Other specified postprocedural states: Secondary | ICD-10-CM

## 2017-09-10 DIAGNOSIS — M2011 Hallux valgus (acquired), right foot: Secondary | ICD-10-CM | POA: Diagnosis not present

## 2017-09-10 DIAGNOSIS — M79671 Pain in right foot: Secondary | ICD-10-CM

## 2017-09-10 NOTE — Progress Notes (Signed)
-Subjective: Deborah Petersen is a 67 y.o. female patient seen today in office for POV #2 (DOS 08-26-17), S/P R Austin Bunionectomy. Patient denies pain at surgical site, states that boot hurts now, denies calf pain, denies headache, chest pain, shortness of breath, nausea, vomiting, fever, or chills. Patient states that she is doing well and is only taking Aleve as needed. No other issues noted.   Patient Active Problem List   Diagnosis Date Noted  . Laryngopharyngeal reflux (LPR) 08/22/2017  . Acute pharyngitis 08/22/2017  . Elevated LDL cholesterol level 07/25/2017  . Cervical pain (neck) 02/12/2017  . Hypothyroidism 10/03/2016  . Cyst of meniscus of left knee- s/p sx Dr Tonita Cong- [facility] 10/03/2016  . S/P hysterectomy- no cervix 10/03/2016  . History of non anemic vitamin B12 deficiency 10/03/2016  . BMI 35.0-35.9,adult 10/03/2016  . h/o RA (rheumatoid arthritis) - cured by faith and prayer   . Dental crowns present   . 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 Vitamin D deficiency   . Fe Def Anemia   . Macromastia 10/29/2011    Current Outpatient Medications on File Prior to Visit  Medication Sig Dispense Refill  . amoxicillin-clavulanate (AUGMENTIN) 250-62.5 MG/5ML suspension Take 10 mLs by mouth 2 (two) times daily. 200 mL 0  . aspirin EC 81 MG tablet Take 4 tablets (325 mg total) by mouth every morning. 60 tablet 0  . BEE POLLEN PO Take 1 tablet daily by mouth.    . chlorpheniramine-HYDROcodone (TUSSIONEX) 10-8 MG/5ML SUER Take 5 mLs by mouth every 12 (twelve) hours as needed for cough (cough, will cause drowsiness.). 200 mL 0  . chlorthalidone (HYGROTON) 25 MG tablet Take 1 tablet (25 mg total) by mouth every morning. 90 tablet 1  . Cholecalciferol (VITAMIN D-3) 5000 UNITS TABS Take 1 tablet by mouth every morning.     . COD LIVER OIL PO Take 1 capsule by mouth every morning.     . Iron-FA-B Cmp-C-Biot-Probiotic (FUSION PLUS) CAPS Take 1 capsule by  mouth daily. 90 capsule 1  . levothyroxine (SYNTHROID, LEVOTHROID) 100 MCG tablet Take 1 tablet (100 mcg total) by mouth daily before breakfast. Patient needs office visit for further refills. 90 tablet 1  . naproxen sodium (ANAPROX) 220 MG tablet Take 220 mg by mouth 2 (two) times daily as needed (pain.).    Marland Kitchen predniSONE (DELTASONE) 20 MG tablet Take 3 pills a day for 2 days, 2 pills a day for 2 days, 1 pill a day for 2 days then one half pill a day for 2 days then off 14 tablet 0  . Turmeric 500 MG TABS Take by mouth.     No current facility-administered medications on file prior to visit.     Allergies  Allergen Reactions  . Erythromycin Nausea And Vomiting    Objective: There were no vitals filed for this visit.  General: No acute distress, AAOx3  Right foot: Sutures intact with no gapping or dehiscence at surgical site, mild swelling and decreased bruising to right foot, no erythema, no warmth, no drainage, no signs of infection noted, Capillary fill time <3 seconds in all digits, gross sensation present via light touch to right foot. No pain or crepitation with range of motion right foot.  No pain with calf compression. .   Assessment and Plan:  Problem List Items Addressed This Visit    None    Visit Diagnoses    Hav (hallux abducto valgus), right    -  Primary   Post-operative state       Right foot pain           -Patient seen and evaluated -Sutures removed  -Applied dry sterile dressing to surgical site rightt foot secured with ACE wrap and stockinet  -Advised patient to make sure to keep dressings clean, dry, and intact to right surgical site for today. May remove for shower on tomorrow and then re-apply ace wrap to help with swelling  -Rx post op shoe of which patient will likely have to use for the next 2-4 weeks depending on xrays and healing    -Advised patient to limit activity to necessity. NO DRIVING  -Advised patient to ice and elevate as necessary and Aleve  PRN -Will plan for xrays at next office visit. In the meantime, patient to call office if any issues or problems arise.   Landis Martins, DPM

## 2017-09-17 ENCOUNTER — Encounter: Payer: BC Managed Care – PPO | Admitting: Sports Medicine

## 2017-09-19 ENCOUNTER — Telehealth: Payer: Self-pay | Admitting: Sports Medicine

## 2017-09-19 NOTE — Telephone Encounter (Signed)
Dr. Cannon Kettle did a bunionectomy surgery on me back on 29 April. I have a good friend's father who passed away and I wanted to know if I can get permission to go the funeral in [place] this weekend. Please call me at [phone]. Thanks. Bye.

## 2017-09-19 NOTE — Telephone Encounter (Signed)
I informed pt of Dr. Leeanne Rio recommendation. Pt states she made an appt with Dr. Cannon Kettle tomorrow to discuss.

## 2017-09-19 NOTE — Telephone Encounter (Signed)
I do NOT recommend patient traveling that far or doing extensive standing or walking at the service after having bunion surgery. Her surgery was less than 1 month ago and a long car ride can cause worsening swelling and pain. She should NOT go. -Dr. Chauncey Cruel

## 2017-09-20 ENCOUNTER — Ambulatory Visit (INDEPENDENT_AMBULATORY_CARE_PROVIDER_SITE_OTHER): Payer: BC Managed Care – PPO | Admitting: Sports Medicine

## 2017-09-20 ENCOUNTER — Other Ambulatory Visit: Payer: Self-pay | Admitting: Sports Medicine

## 2017-09-20 ENCOUNTER — Encounter: Payer: Self-pay | Admitting: Sports Medicine

## 2017-09-20 ENCOUNTER — Ambulatory Visit (INDEPENDENT_AMBULATORY_CARE_PROVIDER_SITE_OTHER): Payer: BC Managed Care – PPO

## 2017-09-20 VITALS — BP 135/61 | HR 70 | Temp 99.5°F

## 2017-09-20 DIAGNOSIS — M2011 Hallux valgus (acquired), right foot: Secondary | ICD-10-CM

## 2017-09-20 DIAGNOSIS — M79671 Pain in right foot: Secondary | ICD-10-CM

## 2017-09-20 DIAGNOSIS — Z9889 Other specified postprocedural states: Secondary | ICD-10-CM

## 2017-09-20 NOTE — Progress Notes (Signed)
-Subjective: Deborah Petersen is a 67 y.o. female patient seen today in office for POV #3 (DOS 08-26-17), S/P R Austin Bunionectomy. Patient denies pain at surgical site, states that she is doing good with her postop shoe, denies calf pain, denies headache, chest pain, shortness of breath, nausea, vomiting, fever, or chills. Patient states that she wants to ride with her husband 5 hours away to [place] for a funeral of a family friend and wants to discuss the possibility of going to the service with me. No other issues noted.   Patient Active Problem List   Diagnosis Date Noted  . Laryngopharyngeal reflux (LPR) 08/22/2017  . Acute pharyngitis 08/22/2017  . Elevated LDL cholesterol level 07/25/2017  . Cervical pain (neck) 02/12/2017  . Hypothyroidism 10/03/2016  . Cyst of meniscus of left knee- s/p sx Dr Tonita Cong- [facility] 10/03/2016  . S/P hysterectomy- no cervix 10/03/2016  . History of non anemic vitamin B12 deficiency 10/03/2016  . BMI 35.0-35.9,adult 10/03/2016  . h/o RA (rheumatoid arthritis) - cured by faith and prayer   . Dental crowns present   . 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 Vitamin D deficiency   . Fe Def Anemia   . Macromastia 10/29/2011    Current Outpatient Medications on File Prior to Visit  Medication Sig Dispense Refill  . amoxicillin-clavulanate (AUGMENTIN) 250-62.5 MG/5ML suspension Take 10 mLs by mouth 2 (two) times daily. 200 mL 0  . aspirin EC 81 MG tablet Take 4 tablets (325 mg total) by mouth every morning. 60 tablet 0  . B Complex Vitamins (VITAMIN B COMPLEX PO) Take by mouth.    . BEE POLLEN PO Take 1 tablet daily by mouth.    . benzonatate (TESSALON) 100 MG capsule TK ONE C PO  TID PRF COUGH FOR 7 DAYS    . chlorpheniramine-HYDROcodone (TUSSIONEX) 10-8 MG/5ML SUER Take 5 mLs by mouth every 12 (twelve) hours as needed for cough (cough, will cause drowsiness.). 200 mL 0  . chlorthalidone (HYGROTON) 25 MG tablet Take 1  tablet (25 mg total) by mouth every morning. 90 tablet 1  . Cholecalciferol (VITAMIN D-3) 5000 UNITS TABS Take 1 tablet by mouth every morning.     . COD LIVER OIL PO Take 1 capsule by mouth every morning.     . Iron-FA-B Cmp-C-Biot-Probiotic (FUSION PLUS) CAPS Take 1 capsule by mouth daily. 90 capsule 1  . LACTOBACILLUS PO TAKE 1 CAPSULE BY MOUTH EVERY DAY    . levothyroxine (SYNTHROID, LEVOTHROID) 100 MCG tablet Take 1 tablet (100 mcg total) by mouth daily before breakfast. Patient needs office visit for further refills. 90 tablet 1  . naproxen sodium (ANAPROX) 220 MG tablet Take 220 mg by mouth 2 (two) times daily as needed (pain.).    Marland Kitchen pantoprazole (PROTONIX) 40 MG tablet TAKE ONE PILL(40MG ) 30 MINUTES BEFORE BREAKFAST AND ONE PILL 30 MINUTES BEFORE DINNER.  1  . predniSONE (DELTASONE) 20 MG tablet Take 3 pills a day for 2 days, 2 pills a day for 2 days, 1 pill a day for 2 days then one half pill a day for 2 days then off 14 tablet 0  . Turmeric 500 MG TABS Take by mouth.     No current facility-administered medications on file prior to visit.     Allergies  Allergen Reactions  . Erythromycin Nausea And Vomiting    Objective: There were no vitals filed for this visit.  General: No acute distress, AAOx3  Right foot: Incision healing well with no gapping or dehiscence at surgical site, mild swelling to right foot, no erythema, no warmth, no drainage, no signs of infection noted, Capillary fill time <3 seconds in all digits, gross sensation present via light touch to right foot. No pain or crepitation with range of motion right foot.  No pain with calf compression. .   X-ray right foot osteotomy site healing well at first metatarsal, hardware intact, mild soft tissue swelling no other acute findings.  Assessment and Plan:  Problem List Items Addressed This Visit    None    Visit Diagnoses    Hav (hallux abducto valgus), right    -  Primary   Relevant Orders   DG Foot Complete  Right   Post-operative state       Relevant Orders   DG Foot Complete Right   Right foot pain           -Patient seen and evaluated -X-rays reviewed -Discussed with patient in great detail risk of traveling 5 hours for increased pain swelling and blood clot and at this point I do not recommend patient to travel that far however if patient does decide to go against my advice would recommend patient to right and backseat and elevate her foot, to carry ice pack to help control swelling and to use a Ace compression wrap to assist with edema control and to also preventatively take full dose of aspirin to prevent against DVT blood clot.  I also advised patient to continue with postop shoe to protect surgical site over the next 2 weeks and then after may slowly transition to a good supportive tennis shoe.  Patient cannot drive while in postop shoe and must refrain from sitting extended amount of time with her foot in a dependent position advised patient that her limit of sitting in that position would be no more than 30 minutes at one time.  Patient expressed understanding -Patient may use scar creams or gels along the incision area -Will plan for postop check and increasing activities at next office visit. In the meantime, patient to call office if any issues or problems arise.   Landis Martins, DPM

## 2017-09-24 ENCOUNTER — Encounter: Payer: BC Managed Care – PPO | Admitting: Sports Medicine

## 2017-09-30 ENCOUNTER — Telehealth: Payer: Self-pay | Admitting: Sports Medicine

## 2017-09-30 NOTE — Telephone Encounter (Signed)
I had surgery with Dr. Cannon Kettle on 29 April and I'm transitioning into a shoe. I'm able to wear the shoe 7 hours a day. I'm wanting to know if I have privileges to start driving again? My number is [phone]. Thank you. Bye.

## 2017-10-01 NOTE — Telephone Encounter (Signed)
I informed pt of Dr. Leeanne Rio okay to drive. Pt cheered.

## 2017-10-01 NOTE — Telephone Encounter (Signed)
Patient can drive now since she is in a normal shoe -Dr. Chauncey Cruel

## 2017-10-09 ENCOUNTER — Other Ambulatory Visit: Payer: Self-pay | Admitting: Family Medicine

## 2017-10-15 ENCOUNTER — Ambulatory Visit (INDEPENDENT_AMBULATORY_CARE_PROVIDER_SITE_OTHER): Payer: BC Managed Care – PPO | Admitting: Sports Medicine

## 2017-10-15 ENCOUNTER — Encounter: Payer: Self-pay | Admitting: Sports Medicine

## 2017-10-15 DIAGNOSIS — Z9889 Other specified postprocedural states: Secondary | ICD-10-CM

## 2017-10-15 DIAGNOSIS — M2011 Hallux valgus (acquired), right foot: Secondary | ICD-10-CM

## 2017-10-15 DIAGNOSIS — M79671 Pain in right foot: Secondary | ICD-10-CM

## 2017-10-15 NOTE — Progress Notes (Signed)
-Subjective: Deborah Petersen is a 67 y.o. female patient seen today in office for POV #4 (DOS 08-26-17), S/P R Austin Bunionectomy. Patient denies pain at surgical site, states that she is doing good in normal shoe. No other issues noted.   Patient Active Problem List   Diagnosis Date Noted  . Laryngopharyngeal reflux (LPR) 08/22/2017  . Acute pharyngitis 08/22/2017  . Elevated LDL cholesterol level 07/25/2017  . Cervical pain (neck) 02/12/2017  . Hypothyroidism 10/03/2016  . Cyst of meniscus of left knee- s/p sx Dr Tonita Cong- [facility] 10/03/2016  . S/P hysterectomy- no cervix 10/03/2016  . History of non anemic vitamin B12 deficiency 10/03/2016  . BMI 35.0-35.9,adult 10/03/2016  . h/o RA (rheumatoid arthritis) - cured by faith and prayer   . Dental crowns present   . 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 Vitamin D deficiency   . Fe Def Anemia   . Macromastia 10/29/2011    Current Outpatient Medications on File Prior to Visit  Medication Sig Dispense Refill  . amoxicillin-clavulanate (AUGMENTIN) 250-62.5 MG/5ML suspension Take 10 mLs by mouth 2 (two) times daily. 200 mL 0  . aspirin EC 81 MG tablet Take 4 tablets (325 mg total) by mouth every morning. 60 tablet 0  . B Complex Vitamins (VITAMIN B COMPLEX PO) Take by mouth.    . BEE POLLEN PO Take 1 tablet daily by mouth.    . benzonatate (TESSALON) 100 MG capsule TK ONE C PO  TID PRF COUGH FOR 7 DAYS    . chlorpheniramine-HYDROcodone (TUSSIONEX) 10-8 MG/5ML SUER Take 5 mLs by mouth every 12 (twelve) hours as needed for cough (cough, will cause drowsiness.). 200 mL 0  . chlorthalidone (HYGROTON) 25 MG tablet Take 1 tablet (25 mg total) by mouth every morning. 90 tablet 1  . Cholecalciferol (VITAMIN D-3) 5000 UNITS TABS Take 1 tablet by mouth every morning.     . COD LIVER OIL PO Take 1 capsule by mouth every morning.     . Iron-FA-B Cmp-C-Biot-Probiotic (FUSION PLUS) CAPS Take 1 capsule by mouth daily.  90 capsule 1  . LACTOBACILLUS PO TAKE 1 CAPSULE BY MOUTH EVERY DAY    . levothyroxine (SYNTHROID, LEVOTHROID) 100 MCG tablet Take 1 tablet (100 mcg total) by mouth daily before breakfast. Patient needs office visit for further refills. 90 tablet 1  . naproxen sodium (ANAPROX) 220 MG tablet Take 220 mg by mouth 2 (two) times daily as needed (pain.).    Marland Kitchen pantoprazole (PROTONIX) 40 MG tablet TAKE ONE PILL(40MG ) 30 MINUTES BEFORE BREAKFAST AND ONE PILL 30 MINUTES BEFORE DINNER.  1  . predniSONE (DELTASONE) 20 MG tablet Take 3 pills a day for 2 days, 2 pills a day for 2 days, 1 pill a day for 2 days then one half pill a day for 2 days then off 14 tablet 0  . Turmeric 500 MG TABS Take by mouth.     No current facility-administered medications on file prior to visit.     Allergies  Allergen Reactions  . Erythromycin Nausea And Vomiting    Objective: There were no vitals filed for this visit.  General: No acute distress, AAOx3  Right foot: Incision well healed at surgical site, mild swelling to right foot, no erythema, no warmth, no drainage, no signs of infection noted, Capillary fill time <3 seconds in all digits, gross sensation present via light touch to right foot. No pain or crepitation with range of motion right  foot.  No pain with calf compression. .   Assessment and Plan:  Problem List Items Addressed This Visit    None    Visit Diagnoses    Hav (hallux abducto valgus), right    -  Primary   Post-operative state       Right foot pain           -Patient seen and evaluated -Patient is doing well; encouraged range of motion and normal activities -Patient may use scar creams or gels along the incision area as previously recommended -Will plan for final xray and postop check at next office visit. In the meantime, patient to call office if any issues or problems arise.   Landis Martins, DPM

## 2017-11-26 ENCOUNTER — Ambulatory Visit: Payer: Self-pay

## 2017-11-26 ENCOUNTER — Encounter: Payer: Self-pay | Admitting: Sports Medicine

## 2017-11-26 ENCOUNTER — Ambulatory Visit (INDEPENDENT_AMBULATORY_CARE_PROVIDER_SITE_OTHER): Payer: BC Managed Care – PPO

## 2017-11-26 ENCOUNTER — Ambulatory Visit (INDEPENDENT_AMBULATORY_CARE_PROVIDER_SITE_OTHER): Payer: BC Managed Care – PPO | Admitting: Sports Medicine

## 2017-11-26 DIAGNOSIS — M2011 Hallux valgus (acquired), right foot: Secondary | ICD-10-CM

## 2017-11-26 DIAGNOSIS — Z9889 Other specified postprocedural states: Secondary | ICD-10-CM

## 2017-11-26 DIAGNOSIS — M79671 Pain in right foot: Secondary | ICD-10-CM

## 2017-11-26 NOTE — Progress Notes (Signed)
Subjective: Deborah Petersen is a 67 y.o. female patient seen today in office for POV #5 (DOS 08-26-17), S/P R Austin Bunionectomy. Patient denies pain at surgical site, states that she is doing good in normal shoe and is following up after her cruise, reports that her toe does not touch down completely but otherwise no symptoms. No other issues noted.   Patient Active Problem List   Diagnosis Date Noted  . Laryngopharyngeal reflux (LPR) 08/22/2017  . Acute pharyngitis 08/22/2017  . Elevated LDL cholesterol level 07/25/2017  . Cervical pain (neck) 02/12/2017  . Hypothyroidism 10/03/2016  . Cyst of meniscus of left knee- s/p sx Dr Tonita Cong- [facility] 10/03/2016  . S/P hysterectomy- no cervix 10/03/2016  . History of non anemic vitamin B12 deficiency 10/03/2016  . BMI 35.0-35.9,adult 10/03/2016  . h/o RA (rheumatoid arthritis) - cured by faith and prayer   . Dental crowns present   . 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 Vitamin D deficiency   . Fe Def Anemia   . Macromastia 10/29/2011    Current Outpatient Medications on File Prior to Visit  Medication Sig Dispense Refill  . amoxicillin-clavulanate (AUGMENTIN) 250-62.5 MG/5ML suspension Take 10 mLs by mouth 2 (two) times daily. (Patient not taking: Reported on 10/15/2017) 200 mL 0  . aspirin EC 81 MG tablet Take 4 tablets (325 mg total) by mouth every morning. 60 tablet 0  . B Complex Vitamins (VITAMIN B COMPLEX PO) Take by mouth.    . BEE POLLEN PO Take 1 tablet daily by mouth.    . benzonatate (TESSALON) 100 MG capsule TK ONE C PO  TID PRF COUGH FOR 7 DAYS    . chlorpheniramine-HYDROcodone (TUSSIONEX) 10-8 MG/5ML SUER Take 5 mLs by mouth every 12 (twelve) hours as needed for cough (cough, will cause drowsiness.). (Patient not taking: Reported on 10/15/2017) 200 mL 0  . chlorthalidone (HYGROTON) 25 MG tablet Take 1 tablet (25 mg total) by mouth every morning. 90 tablet 1  . Cholecalciferol (VITAMIN D-3)  5000 UNITS TABS Take 1 tablet by mouth every morning.     . COD LIVER OIL PO Take 1 capsule by mouth every morning.     . Iron-FA-B Cmp-C-Biot-Probiotic (FUSION PLUS) CAPS Take 1 capsule by mouth daily. 90 capsule 1  . LACTOBACILLUS PO TAKE 1 CAPSULE BY MOUTH EVERY DAY    . levothyroxine (SYNTHROID, LEVOTHROID) 100 MCG tablet Take 1 tablet (100 mcg total) by mouth daily before breakfast. Patient needs office visit for further refills. 90 tablet 1  . naproxen sodium (ANAPROX) 220 MG tablet Take 220 mg by mouth 2 (two) times daily as needed (pain.).    Marland Kitchen pantoprazole (PROTONIX) 40 MG tablet TAKE ONE PILL(40MG ) 30 MINUTES BEFORE BREAKFAST AND ONE PILL 30 MINUTES BEFORE DINNER.  1  . predniSONE (DELTASONE) 20 MG tablet Take 3 pills a day for 2 days, 2 pills a day for 2 days, 1 pill a day for 2 days then one half pill a day for 2 days then off (Patient not taking: Reported on 10/15/2017) 14 tablet 0  . Turmeric 500 MG TABS Take by mouth.     No current facility-administered medications on file prior to visit.     Allergies  Allergen Reactions  . Erythromycin Nausea And Vomiting    Objective: There were no vitals filed for this visit.  General: No acute distress, AAOx3  Right foot: Incision well healed at surgical site, no swelling to right  foot, no erythema, no warmth, no drainage, no signs of infection noted, Capillary fill time <3 seconds in all digits, gross sensation present via light touch to right foot. No pain or crepitation with range of motion right foot, mild scar limitation with plantarflexion.  No pain with calf compression.   Xray, Right foot- Osteotomy well healed with reactive callus. 1st ray in appropriate position. No other acute findings.   Assessment and Plan:  Problem List Items Addressed This Visit    None    Visit Diagnoses    Hav (hallux abducto valgus), right    -  Primary   Relevant Orders   DG Foot Complete Right (Completed)   DG Foot Complete Right    Post-operative state       Right foot pain         -Patient seen and evaluated -Encouraged range of motion activities  -Continue with normal activities and good supportive shoes -Return PRN. Discharged from post op care  Landis Martins, DPM

## 2017-12-17 ENCOUNTER — Encounter: Payer: Self-pay | Admitting: Family Medicine

## 2017-12-17 ENCOUNTER — Ambulatory Visit: Payer: BC Managed Care – PPO | Admitting: Family Medicine

## 2017-12-17 VITALS — BP 134/87 | HR 60 | Ht 61.0 in | Wt 185.3 lb

## 2017-12-17 DIAGNOSIS — I10 Essential (primary) hypertension: Secondary | ICD-10-CM | POA: Insufficient documentation

## 2017-12-17 DIAGNOSIS — Z9889 Other specified postprocedural states: Secondary | ICD-10-CM | POA: Diagnosis not present

## 2017-12-17 DIAGNOSIS — Z8639 Personal history of other endocrine, nutritional and metabolic disease: Secondary | ICD-10-CM

## 2017-12-17 DIAGNOSIS — R5382 Chronic fatigue, unspecified: Secondary | ICD-10-CM | POA: Insufficient documentation

## 2017-12-17 DIAGNOSIS — E039 Hypothyroidism, unspecified: Secondary | ICD-10-CM

## 2017-12-17 DIAGNOSIS — R6889 Other general symptoms and signs: Secondary | ICD-10-CM

## 2017-12-17 DIAGNOSIS — E559 Vitamin D deficiency, unspecified: Secondary | ICD-10-CM

## 2017-12-17 DIAGNOSIS — Z23 Encounter for immunization: Secondary | ICD-10-CM | POA: Diagnosis not present

## 2017-12-17 DIAGNOSIS — D509 Iron deficiency anemia, unspecified: Secondary | ICD-10-CM

## 2017-12-17 DIAGNOSIS — E669 Obesity, unspecified: Secondary | ICD-10-CM

## 2017-12-17 MED ORDER — VITAMIN D3 125 MCG (5000 UT) PO TABS: ORAL_TABLET | ORAL | 3 refills | Status: AC

## 2017-12-17 MED ORDER — LEVOTHYROXINE SODIUM 100 MCG PO TABS: 100.0000 ug | ORAL_TABLET | Freq: Every day | ORAL | 3 refills | Status: DC

## 2017-12-17 MED ORDER — FUSION PLUS PO CAPS: 1.0000 | ORAL_CAPSULE | Freq: Every day | ORAL | 3 refills | Status: DC

## 2017-12-17 MED ORDER — CHLORTHALIDONE 25 MG PO TABS: 25.0000 mg | ORAL_TABLET | Freq: Every morning | ORAL | 3 refills | Status: DC

## 2017-12-17 NOTE — Addendum Note (Signed)
Addended by: Lanier Prude D on: 12/17/2017 04:35 PM   Modules accepted: Orders

## 2017-12-17 NOTE — Patient Instructions (Addendum)
To restart your Synthroid  we will start at half a tablet a day for about 4 days as long as you are not feeling any excessive sweatiness, hyper, restlessness, shakiness etc. go to the 1 full tablet daily. You will need reck labs in 6-8 wks.      Recombinant Zoster (Shingles) Vaccine, RZV: What You Need to Know 1. Why get vaccinated? Shingles (also called herpes zoster, or just zoster) is a painful skin rash, often with blisters. Shingles is caused by the varicella zoster virus, the same virus that causes chickenpox. After you have chickenpox, the virus stays in your body and can cause shingles later in life. You can't catch shingles from another person. However, a person who has never had chickenpox (or chickenpox vaccine) could get chickenpox from someone with shingles. A shingles rash usually appears on one side of the face or body and heals within 2 to 4 weeks. Its main symptom is pain, which can be severe. Other symptoms can include fever, headache, chills and upset stomach. Very rarely, a shingles infection can lead to pneumonia, hearing problems, blindness, brain inflammation (encephalitis), or death. For about 1 person in 5, severe pain can continue even long after the rash has cleared up. This long-lasting pain is called post-herpetic neuralgia (PHN). Shingles is far more common in people 58 years of age and older than in younger people, and the risk increases with age. It is also more common in people whose immune system is weakened because of a disease such as cancer, or by drugs such as steroids or chemotherapy. At least 1 million people a year in the [place] get shingles. 2. Shingles vaccine (recombinant) Recombinant shingles vaccine was approved by FDA in 2017 for the prevention of shingles. In clinical trials, it was more than 90% effective in preventing shingles. It can also reduce the likelihood of PHN. Two doses, 2 to 6 months apart, are recommended for adults 43 and  older. This vaccine is also recommended for people who have already gotten the live shingles vaccine (Zostavax). There is no live virus in this vaccine. 3. Some people should not get this vaccine Tell your vaccine provider if you:  Have any severe, life-threatening allergies. A person who has ever had a life-threatening allergic reaction after a dose of recombinant shingles vaccine, or has a severe allergy to any component of this vaccine, may be advised not to be vaccinated. Ask your health care provider if you want information about vaccine components.  Are pregnant or breastfeeding. There is not much information about use of recombinant shingles vaccine in pregnant or nursing women. Your healthcare provider might recommend delaying vaccination.  Are not feeling well. If you have a mild illness, such as a cold, you can probably get the vaccine today. If you are moderately or severely ill, you should probably wait until you recover. Your doctor can advise you.  4. Risks of a vaccine reaction With any medicine, including vaccines, there is a chance of reactions. After recombinant shingles vaccination, a person might experience:  Pain, redness, soreness, or swelling at the site of the injection  Headache, muscle aches, fever, shivering, fatigue  In clinical trials, most people got a sore arm with mild or moderate pain after vaccination, and some also had redness and swelling where they got the shot. Some people felt tired, had muscle pain, a headache, shivering, fever, stomach pain, or nausea. About 1 out of 6 people who got recombinant zoster vaccine experienced side effects that  prevented them from doing regular activities. Symptoms went away on their own in about 2 to 3 days. Side effects were more common in younger people. You should still get the second dose of recombinant zoster vaccine even if you had one of these reactions after the first dose. Other things that could happen after this  vaccine:  People sometimes faint after medical procedures, including vaccination. Sitting or lying down for about 15 minutes can help prevent fainting and injuries caused by a fall. Tell your provider if you feel dizzy or have vision changes or ringing in the ears.  Some people get shoulder pain that can be more severe and longer-lasting than routine soreness that can follow injections. This happens very rarely.  Any medication can cause a severe allergic reaction. Such reactions to a vaccine are estimated at about 1 in a million doses, and would happen within a few minutes to a few hours after the vaccination. As with any medicine, there is a very remote chance of a vaccine causing a serious injury or death. The safety of vaccines is always being monitored. For more information, visit: http://www.aguilar.org/ 5. What if there is a serious problem? What should I look for?  Look for anything that concerns you, such as signs of a severe allergic reaction, very high fever, or unusual behavior. Signs of a severe allergic reaction can include hives, swelling of the face and throat, difficulty breathing, a fast heartbeat, dizziness, and weakness. These would usually start a few minutes to a few hours after the vaccination. What should I do?  If you think it is a severe allergic reaction or other emergency that can't wait, call 9-1-1 and get to the nearest hospital. Otherwise, call your health care provider. Afterward, the reaction should be reported to the Vaccine Adverse Event Reporting System (VAERS). Your doctor should file this report, or you can do it yourself through the VAERS web site atwww.vaers.https://www.bray.com/ by calling (726)327-5669. VAERS does not give medical advice. 6. How can I learn more?  Ask your healthcare provider. He or she can give you the vaccine package insert or suggest other sources of information.  Call your local or state health department.  Contact the Centers for  Disease Control and Prevention (CDC): ? Call 334-714-0871 (1-800-CDC-INFO) or ? Visit the CDC's website at http://hunter.com/ CDC Vaccine Information Statement (VIS) Recombinant Zoster Vaccine (06/11/2016) This information is not intended to replace advice given to you by your health care provider. Make sure you discuss any questions you have with your health care provider. Document Released: 06/26/2016 Document Revised: 06/26/2016 Document Reviewed: 06/26/2016 Elsevier Interactive Patient Education  [employer].

## 2017-12-17 NOTE — Progress Notes (Signed)
Impression and Recommendations:    1. Hypothyroidism, unspecified type   2. History of non anemic vitamin B12 deficiency   3. Hypertension, unspecified type   4. Status post R bunionectomy- 4/19   5. Chronic fatigue   6. Cold intolerance   7. Iron deficiency anemia, unspecified iron deficiency anemia type   8. Vitamin D deficiency   9. Obesity, Class II, BMI 35-39.9     1. Hypothyroidism: Refill of her 100 mcg Synthroid given today.  Told her to start at 50 mics daily for 4 days then go to 102 decrease any chance of side effect of higher dose since she has been off it for so long.  Recheck labs in 6 to 8 weeks.  We did change pharmacies to a [facility] nearby  2. Patient with history of iron deficiency anemia and B12 deficiency: Rechecked levels back in March which were all stable.  Her B12 was high normal and patient has not wanted to cut back on her dose at that time.  Denies side effects.  Will continue current medications.  3. Blood pressure: Well-controlled on current medication of chlorthalidone.  Patient asymptomatic.  4. Status post bunionectomy on the right first digit: We will send patient to physical therapy for mobilization of her keloid scar as well as range of motion and strengthening to get her back to exercising.  5. Vitamin D deficiency: Patient has been taking 5000 IUs vitamin D3 for some time now.  When last checked back in March levels were well controlled between 50 and 60.  Will continue current dose.  6. Obesity:    Explained to patient what BMI refers to, and what it means medically.    Told patient to think about it as a "medical risk stratification measurement" and how increasing BMI is associated with increasing risk/ or worsening state of various diseases such as hypertension, hyperlipidemia, diabetes, premature OA, depression etc.   American Heart Association guidelines for healthy diet, basically Mediterranean diet, and exercise guidelines of 30  minutes 5 days per week or more discussed in detail.   Health counseling performed.  All questions answered.    Orders Placed This Encounter  Procedures  . TSH + free T4  . Ambulatory referral to Physical Therapy    Meds ordered this encounter  Medications  . levothyroxine (SYNTHROID, LEVOTHROID) 100 MCG tablet    Sig: Take 1 tablet (100 mcg total) by mouth daily before breakfast. Patient needs office visit for further refills.    Dispense:  90 tablet    Refill:  3  . chlorthalidone (HYGROTON) 25 MG tablet    Sig: Take 1 tablet (25 mg total) by mouth every morning.    Dispense:  90 tablet    Refill:  3  . Iron-FA-B Cmp-C-Biot-Probiotic (FUSION PLUS) CAPS    Sig: Take 1 capsule by mouth daily.    Dispense:  90 capsule    Refill:  3  . Cholecalciferol (VITAMIN D3) 5000 units TABS    Sig: 5,000 IU OTC vitamin D3 daily.    Dispense:  90 tablet    Refill:  3    Medications Discontinued During This Encounter  Medication Reason  . amoxicillin-clavulanate (AUGMENTIN) 250-62.5 MG/5ML suspension Completed Course  . benzonatate (TESSALON) 100 MG capsule Completed Course  . chlorpheniramine-HYDROcodone ([place]) 10-8 MG/5ML SUER Completed Course  . Cholecalciferol (VITAMIN D-3) 5000 UNITS TABS Patient Preference  . LACTOBACILLUS PO   . pantoprazole (PROTONIX) 40 MG tablet No longer  needed (for PRN medications)  . predniSONE (DELTASONE) 20 MG tablet Completed Course  . levothyroxine (SYNTHROID, LEVOTHROID) 100 MCG tablet Reorder  . chlorthalidone (HYGROTON) 25 MG tablet Reorder  . Iron-FA-B Cmp-C-Biot-Probiotic (FUSION PLUS) CAPS Reorder     Gross side effects, risk and benefits, and alternatives of medications and treatment plan in general discussed with patient.  Patient is aware that all medications have potential side effects and we are unable to predict every side effect or drug-drug interaction that may occur.   Patient will call with any questions prior to using medication  if they have concerns.  Expresses verbal understanding and consents to current therapy and treatment regimen.  No barriers to understanding were identified.  Red flag symptoms and signs discussed in detail.  Patient expressed understanding regarding what to do in case of emergency\urgent symptoms  Please see AVS handed out to patient at the end of our visit for further patient instructions/ counseling done pertaining to today's office visit.   Return for 6-8 wks- lab only; then 4-11mo with me f/up Bp, thyroid and wt loss.    Note:  This note was prepared with assistance of Dragon voice recognition software. Occasional wrong-word or sound-a-like substitutions may have occurred due to the inherent limitations of voice recognition software.    I have reviewed the above documentation for accuracy and completeness, and I agree with the above.   Mellody Dance 12/17/17 11:51 AM  --------------------------------------------------------------------------------------------------------------------------------------------------------------------------------------------------------------------------------------------    Subjective:     HPI: Deborah Petersen is a 67 y.o. female who presents to [place] at [facility] today for issues as discussed below.   Hypothyroidism: Per patient she recently noticed that [facility] has not been sending her her levothyroxine medications since April.  According to our records we sent out prescription early April for 90+1 refill to [place] but per patient, she has not been taking them.  She has concerns today that she has been excessively cold, fatigued, and just in general has felt cruddy and lacked energy.  She has concerns of what her current thyroid levels are and what were going to do about correcting it.   Hypertension: Patient's blood pressure is well controlled today for 67 year old.  She has no complaints.  She denies any history of  headaches, visual changes, dizziness, chest pain, shortness of breath orthopnea.  She has no exercise intolerance although she has not been exercising much lately due to the heat.   Status post bunionectomy in April with local podiatrist.  Per patient she did not recommend any physical therapy and patient has not been back to any activities yet-not walking or exercising yet.   She does complain of some decreased range of motion, decreased strength and keloid which has tissue adhesions which has limited her activity levels.    Wt Readings from Last 3 Encounters:  12/17/17 185 lb 4.8 oz (84.1 kg)  08/19/17 185 lb 14.4 oz (84.3 kg)  07/25/17 193 lb 4.8 oz (87.7 kg)   BP Readings from Last 3 Encounters:  12/17/17 134/87  09/20/17 135/61  09/03/17 115/66   Pulse Readings from Last 3 Encounters:  12/17/17 60  09/20/17 70  09/03/17 69   BMI Readings from Last 3 Encounters:  12/17/17 35.01 kg/m  08/19/17 35.13 kg/m  07/25/17 36.52 kg/m     Patient Care Team    Relationship Specialty Notifications Start End  Mellody Dance, DO PCP - General Family Medicine  10/03/16   Carol Ada, MD Consulting Physician  Gastroenterology  10/03/16   Susa Day, MD Consulting Physician Orthopedic Surgery  10/03/16      Patient Active Problem List   Diagnosis Date Noted  . Hypertension 12/17/2017    Priority: High  . h/o Elevated LDL cholesterol level 07/25/2017    Priority: High  . Hypothyroidism 10/03/2016    Priority: High  . BMI 35.0-35.9,adult 10/03/2016    Priority: High  . Chronic fatigue 12/17/2017    Priority: Medium  . 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 Vitamin D deficiency     Priority: Medium  . Fe Def Anemia     Priority: Medium  . Status post R bunionectomy- 4/19 12/17/2017    Priority: Low  . Cyst of meniscus of left knee- s/p sx Dr Tonita Cong- [facility] 10/03/2016    Priority: Low  . S/P hysterectomy- no cervix  10/03/2016    Priority: Low  . History of non anemic vitamin B12 deficiency 10/03/2016    Priority: Low  . h/o RA (rheumatoid arthritis) - cured by faith and prayer     Priority: Low  . Laryngopharyngeal reflux (LPR) 08/22/2017  . Acute pharyngitis 08/22/2017  . Cervical pain (neck) 02/12/2017  . Dental crowns present   . Macromastia 10/29/2011    Past Medical history, Surgical history, Family history, Social history, Allergies and Medications have been entered into the medical record, reviewed and changed as needed.    Current Meds  Medication Sig  . aspirin EC 81 MG tablet Take 4 tablets (325 mg total) by mouth every morning.  . B Complex Vitamins (VITAMIN B COMPLEX PO) Take by mouth.  . BEE POLLEN PO Take 1 tablet daily by mouth.  . chlorthalidone (HYGROTON) 25 MG tablet Take 1 tablet (25 mg total) by mouth every morning.  . COD LIVER OIL PO Take 1 capsule by mouth every morning.   . Iron-FA-B Cmp-C-Biot-Probiotic (FUSION PLUS) CAPS Take 1 capsule by mouth daily.  . Turmeric 500 MG TABS Take by mouth.  . [DISCONTINUED] chlorthalidone (HYGROTON) 25 MG tablet Take 1 tablet (25 mg total) by mouth every morning.  . [DISCONTINUED] Iron-FA-B Cmp-C-Biot-Probiotic (FUSION PLUS) CAPS Take 1 capsule by mouth daily.    Allergies:  Allergies  Allergen Reactions  . Erythromycin Nausea And Vomiting     Review of Systems:  A fourteen system review of systems was performed and found to be positive as per HPI.   Objective:   Blood pressure 134/87, pulse 60, height 5\' 1"  (1.549 m), weight 185 lb 4.8 oz (84.1 kg). Body mass index is 35.01 kg/m. General:  Well Developed, well nourished, appropriate for stated age.  Neuro:  Alert and oriented,  extra-ocular muscles intact  HEENT:  Normocephalic, atraumatic, neck supple, no carotid bruits appreciated  Skin:  no gross rash, warm, pink.  7- 9 cm in length keloid scar top of right first toe.  Cardiac:  RRR, S1 S2 Respiratory:  ECTA B/L and  A/P, Not using accessory muscles, speaking in full sentences- unlabored. Vascular:  Ext warm, no cyanosis apprec.; cap RF less 2 sec. Psych:  No HI/SI, judgement and insight good, Euthymic mood. Full Affect.

## 2017-12-18 LAB — TSH+FREE T4
FREE T4: 0.12 ng/dL — AB (ref 0.82–1.77)
TSH: 148.1 u[IU]/mL — AB (ref 0.450–4.500)

## 2018-01-01 ENCOUNTER — Encounter: Payer: Self-pay | Admitting: Physical Therapy

## 2018-01-01 ENCOUNTER — Ambulatory Visit: Payer: Medicare Other | Attending: Family Medicine | Admitting: Physical Therapy

## 2018-01-01 DIAGNOSIS — R208 Other disturbances of skin sensation: Secondary | ICD-10-CM | POA: Insufficient documentation

## 2018-01-01 DIAGNOSIS — M25674 Stiffness of right foot, not elsewhere classified: Secondary | ICD-10-CM | POA: Insufficient documentation

## 2018-01-01 NOTE — Therapy (Addendum)
[facility] [place], [place], [address] Phone: [phone]   Fax:  [phone]  Physical Therapy Evaluation/Discharge  Patient Details  Name: Deborah Petersen MRN: [id number] Date of Birth: [birth year] Referring Provider: Mellody Dance, DO   Encounter Date: 01/01/2018  PT End of Session - 01/01/18 0940    Visit Number  1    Number of Visits  7    Date for PT Re-Evaluation  01/24/18    Authorization Type  AETNA MCR    PT Start Time  0935    PT Stop Time  1003    PT Time Calculation (min)  28 min    Activity Tolerance  Patient tolerated treatment well    Behavior During Therapy  [facility] for tasks assessed/performed       Past Medical History:  Diagnosis Date  . Anemia   . Brain tumor ([place]) 2005  . Dental crowns present   . Hypothyroidism   . Macromastia 10/2011  . Rheumatoid arthritis(714.0)    no current problems or meds.  . Vitamin D deficiency     Past Surgical History:  Procedure Laterality Date  . ABDOMINAL HYSTERECTOMY     partial  . ANTERIOR AND POSTERIOR REPAIR  2001  . APPENDECTOMY    . BRAIN MENINGIOMA EXCISION  2004  . BREAST REDUCTION SURGERY  11/19/2011   Procedure: MAMMARY REDUCTION  (BREAST);  Surgeon: Cristine Polio, MD;  Location: [facility];  Service: Plastics;  Laterality: Bilateral;  . FOOT SURGERY    . HERNIA REPAIR    . KNEE ARTHROSCOPY Left 06/09/2015   Procedure: LEFT KNEE ARTHROSCOPY WITH PARTIAL MEDIAL AND LATERAL MENISECTOMY AND DEBRIDEMENT;  Surgeon: Susa Day, MD;  Location: [facility];  Service: Orthopedics;  Laterality: Left;  . KNEE SURGERY    . TONSILLECTOMY      There were no vitals filed for this visit.   Subjective Assessment - 01/01/18 0945    Subjective  Feels fine, it doesn't hurt. Great toe does not lay flat on the floor. Unable to curl toes to lift marbles. Bothers me to wear closed shoes. Does not limit my walking. I want to return to walking  4-5 times/week but I cannot wear my tennis shoes-hypersensitive.     Currently in Pain?  No/denies         [facility] PT Assessment - 01/01/18 0001      Assessment   Medical Diagnosis  s/p bunionectomy    Referring Provider  Mellody Dance, DO    Onset Date/Surgical Date  08/26/17    Hand Dominance  Right    Prior Therapy  no      Precautions   Precautions  None      Restrictions   Weight Bearing Restrictions  No      Balance Screen   Has the patient fallen in the past 6 months  No      [place] residence    Living Arrangements  Spouse/significant other    Additional Comments  one step to enter home      Prior Function   Vocation  Retired      Associate Professor   Overall Cognitive Status  Within Functional Limits for tasks assessed      Observation/Other Assessments   Focus on Therapeutic Outcomes (FOTO)   36% limiation      Sensation   Additional Comments  impaired sensation at surgical site  ROM / Strength   AROM / PROM / Strength  AROM                Objective measurements completed on examination: See above findings.              PT Education - 01/01/18 1013    Education Details  anatomy of condition, POC, HEP, desensitization    Person(s) Educated  Patient    Methods  Explanation;Verbal cues    Comprehension  Verbalized understanding;Need further instruction          PT Long Term Goals - 01/01/18 1007      PT LONG TERM GOAL #1   Title  Pt will be able to stand with great toe flat on the ground    Baseline  unable in standing    Time  3    Period  Weeks    Status  New    Target Date  01/24/18      PT LONG TERM GOAL #2   Title  Pt will be able to return to wearing tennis shoes so she can return to walking for exercise    Baseline  hypersensitivity limits shoe wear    Time  3    Period  Weeks    Status  New    Target Date  01/24/18      PT LONG TERM GOAL #3   Title  FOTO to 33% limited     Baseline  36% limited at eval    Time  3    Period  Weeks    Status  New    Target Date  01/24/18             Plan - 01/01/18 1005    Clinical Impression Statement  Pt presents to PT s/p bunionectomy with complaints of tightness of incision site limiting toe flexibility and hypersensitivity. Good mobiliity noted lacking a couple of degrees passively in 1st MTP flx. Incision is well healed with limited mobility. Pt will benefit from skilled PT in order to improve joint and scar mobility to reach long term goals.     Clinical Presentation  Stable    Clinical Decision Making  Low    Rehab Potential  Good    PT Frequency  2x / week    PT Duration  3 weeks    PT Treatment/Interventions  ADLs/Self Care Home Management;Cryotherapy;Moist Heat;Therapeutic exercise;Patient/family education;Manual techniques;Taping;Passive range of motion;Scar mobilization    PT Next Visit Plan  desensitization, scar mobility, joint mobilization, add towel scrunches to HEP    PT Home Exercise Plan  marble pick ups, desensitization    Consulted and Agree with Plan of Care  Patient       Patient will benefit from skilled therapeutic intervention in order to improve the following deficits and impairments:  Decreased scar mobility, Decreased activity tolerance, Impaired sensation, Impaired flexibility  Visit Diagnosis: Other disturbances of skin sensation - Plan: PT plan of care cert/re-cert  Stiffness of right foot, not elsewhere classified - Plan: PT plan of care cert/re-cert     Problem List Patient Active Problem List   Diagnosis Date Noted  . Hypertension 12/17/2017  . Chronic fatigue 12/17/2017  . Status post R bunionectomy- 4/19 12/17/2017  . Laryngopharyngeal reflux (LPR) 08/22/2017  . Acute pharyngitis 08/22/2017  . h/o Elevated LDL cholesterol level 07/25/2017  . Cervical pain (neck) 02/12/2017  . Hypothyroidism 10/03/2016  . Cyst of meniscus of left knee- s/p sx Dr Tonita Cong- [facility]  10/03/2016  . S/P hysterectomy- no cervix 10/03/2016  . History of non anemic vitamin B12 deficiency 10/03/2016  . BMI 35.0-35.9,adult 10/03/2016  . h/o RA (rheumatoid arthritis) - cured by faith and prayer   . Dental crowns present   . 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 Vitamin D deficiency   . Fe Def Anemia   . Macromastia 10/29/2011    Kaci Freel C. Adalai Perl PT, DPT 01/01/18 10:15 AM   [facility] [address] [place], [place], [address] Phone: [phone]   Fax:  [phone]  Name: Deborah Petersen MRN: [id number] Date of Birth: Mar 30, 1951  PHYSICAL THERAPY DISCHARGE SUMMARY  Visits from Start of Care: 1  Current functional level related to goals / functional outcomes: See above   Remaining deficits: See above   Education / Equipment: Anatomy of condition, POC, HEP, exercise form/rationale  Plan: Patient agrees to discharge.  Patient goals were not met. Patient is being discharged due to not returning since the last visit.  ?????     Benelli Winther C. Nohely Whitehorn PT, DPT 03/17/18 8:44 AM

## 2018-01-10 ENCOUNTER — Encounter

## 2018-01-13 ENCOUNTER — Ambulatory Visit: Payer: Medicare Other | Admitting: Physical Therapy

## 2018-01-15 ENCOUNTER — Encounter: Payer: Self-pay | Admitting: Physical Therapy

## 2018-01-20 ENCOUNTER — Encounter: Payer: Self-pay | Admitting: Physical Therapy

## 2018-01-24 ENCOUNTER — Encounter: Payer: Self-pay | Admitting: Physical Therapy

## 2018-02-04 ENCOUNTER — Other Ambulatory Visit (INDEPENDENT_AMBULATORY_CARE_PROVIDER_SITE_OTHER): Payer: Medicare Other

## 2018-02-04 DIAGNOSIS — E039 Hypothyroidism, unspecified: Secondary | ICD-10-CM

## 2018-02-05 LAB — TSH+T4F+T3FREE
FREE T4: 1.33 ng/dL (ref 0.82–1.77)
T3, Free: 2.8 pg/mL (ref 2.0–4.4)
TSH: 3.02 u[IU]/mL (ref 0.450–4.500)

## 2018-04-03 ENCOUNTER — Ambulatory Visit: Payer: Self-pay | Admitting: Family Medicine

## 2018-04-17 ENCOUNTER — Encounter: Payer: Self-pay | Admitting: Family Medicine

## 2018-04-17 ENCOUNTER — Ambulatory Visit: Payer: Medicare Other | Admitting: Family Medicine

## 2018-04-17 VITALS — BP 126/80 | HR 65 | Temp 97.7°F | Ht 61.0 in | Wt 184.7 lb

## 2018-04-17 DIAGNOSIS — L299 Pruritus, unspecified: Secondary | ICD-10-CM

## 2018-04-17 DIAGNOSIS — Z8719 Personal history of other diseases of the digestive system: Secondary | ICD-10-CM | POA: Diagnosis not present

## 2018-04-17 DIAGNOSIS — Z23 Encounter for immunization: Secondary | ICD-10-CM | POA: Diagnosis not present

## 2018-04-17 DIAGNOSIS — R195 Other fecal abnormalities: Secondary | ICD-10-CM

## 2018-04-17 NOTE — Progress Notes (Signed)
Impression and Recommendations:    1. History of abdominal hernia W MESH REPAIR 2003   2. Itching with irritation- abdominal wall overlaying mesh   3. Stool discoloration and malodorous      History of abdominal hernia W MESH REPAIR 2003 - Plan: Ambulatory referral to General Surgery  Itching with irritation- abdominal wall overlaying mesh - Plan: Ambulatory referral to General Surgery  Stool discoloration and malodorous-we will do stool cards x3, if positive will follow-up in office for anoscopy and further management depending on results    1. History of Abdominal Hernia Repair - Abdominal Hernia Mesh Causing Irritation - Patient notes that the mesh bothers her daily with incessant internal itching, especially at night. - Referral to General Surgery placed today.  - Reviewed need for patient to discuss her symptoms and options with a general surgeon. - Referral placed for consultation.  2. Stool discoloration and malodorous - Patient with concerns today about change in feces color and odor. - Patient sent home with stool cards to assess for microscopic blood in the stool. - Patient denies presence of internal or external hemorrhoids. - Discussed the need to assess three separate stoolings, three separate days.  3. Last Lab Work Obtained in March of 2019 - Labs were last done in March of 2019.   - Discussed with patient that thyroid levels were re-checked and look fantastic on modified dose.   - Patient understands need to return in near future for chronic follow-up.  4. Shingles Vaccine - Second Dose Obtained - Pt with need for second dose of shingles vaccine; had this obtained.   Orders Placed This Encounter  Procedures  . Ambulatory referral to General Surgery   Expresses verbal understanding and consents to current therapy and treatment regimen.  No barriers to understanding were identified.  Red flag symptoms and signs discussed in detail.  Patient expressed  understanding regarding what to do in case of emergency\urgent symptoms  Please see AVS handed out to patient at the end of our visit for further patient instructions/ counseling done pertaining to today's office visit.   Return for 4-13mo routine chronic care.     Note:  This note was prepared with assistance of Dragon voice recognition software. Occasional wrong-word or sound-a-like substitutions may have occurred due to the inherent limitations of voice recognition software.   This document serves as a record of services personally performed by Mellody Dance, DO. It was created on her behalf by Toni Amend, a trained medical scribe. The creation of this record is based on the scribe's personal observations and the provider's statements to them.   I have reviewed the above medical documentation for accuracy and completeness and I concur.  Mellody Dance, DO 04/17/2018 10:25 AM        -------------------------------------------------------------------------------------------------------------------------------------    Subjective:     HPI: Deborah Petersen is a 67 y.o. female who presents to [place] at [facility] today for issues as discussed below.  Notes she's feeling good overall.  She just got off of a cruise recently, and received her second shingles shot.  She goes on about 2-3 cruises per year, and has been on about 30 cruises so far.  History of Abdominal Hernia & Mesh Placed Had an abdominal hernia years ago, before she moved from [facility]; they went in to do it and found that she had several tears.  She was admitted for three days.  Now has mesh in her  abdomen.    Notes that the "mesh messes with me every day."  It "feels itchy inside there."  Sometimes at night, the itching is incessant, and since the itching is internal, there is no way to resolve the itch.  If she rolls over on her stomach at night, notes that this helps  alleviate the itching.  The itching has been constant ever since she had the mesh placed.  Change in Feces Odor Notes she saw an article about smelly feces; "and it might be synonymous with that fusion I take that's probiotic with iron."  Feels that her poop stinks more than it used to.  This odor change began a couple of years after her last colonoscopy, in 2017.  No polyps were found at her last colonoscopy, she was placed on the fusion at that time.    Depression screen [facility] 2/9 04/17/2018 12/17/2017 08/19/2017  Decreased Interest 0 0 2  Down, Depressed, Hopeless 0 0 1  PHQ - 2 Score 0 0 3  Altered sleeping 0 0 1  Tired, decreased energy 0 2 2  Change in appetite 0 0 2  Feeling bad or failure about yourself  0 0 0  Trouble concentrating 0 0 1  Moving slowly or fidgety/restless 0 0 0  Suicidal thoughts 0 0 0  PHQ-9 Score 0 2 9  Difficult doing work/chores Not difficult at all Not difficult at all Somewhat difficult    Wt Readings from Last 3 Encounters:  04/17/18 184 lb 11.2 oz (83.8 kg)  12/17/17 185 lb 4.8 oz (84.1 kg)  08/19/17 185 lb 14.4 oz (84.3 kg)   BP Readings from Last 3 Encounters:  04/17/18 126/80  12/17/17 134/87  09/20/17 135/61   Pulse Readings from Last 3 Encounters:  04/17/18 65  12/17/17 60  09/20/17 70   BMI Readings from Last 3 Encounters:  04/17/18 34.90 kg/m  12/17/17 35.01 kg/m  08/19/17 35.13 kg/m     Patient Care Team    Relationship Specialty Notifications Start End  Mellody Dance, DO PCP - General Family Medicine  10/03/16   Carol Ada, MD Consulting Physician Gastroenterology  10/03/16   Susa Day, MD Consulting Physician Orthopedic Surgery  10/03/16   Landis Martins, DPM Consulting Physician Podiatry  12/17/17      Patient Active Problem List   Diagnosis Date Noted  . Hypertension 12/17/2017    Priority: High  . h/o Elevated LDL cholesterol level 07/25/2017    Priority: High  . Hypothyroidism 10/03/2016    Priority:  High  . BMI 35.0-35.9,adult 10/03/2016    Priority: High  . Chronic fatigue 12/17/2017    Priority: Medium  . 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 Vitamin D deficiency     Priority: Medium  . Fe Def Anemia     Priority: Medium  . Status post R bunionectomy- 4/19 12/17/2017    Priority: Low  . Cyst of meniscus of left knee- s/p sx Dr Tonita Cong- [facility] 10/03/2016    Priority: Low  . S/P hysterectomy- no cervix 10/03/2016    Priority: Low  . History of non anemic vitamin B12 deficiency 10/03/2016    Priority: Low  . h/o RA (rheumatoid arthritis) - cured by faith and prayer     Priority: Low  . History of abdominal hernia W MESH REPAIR 2003 04/17/2018  . Itching with irritation- abdominal wall overlaying mesh 04/17/2018  . Laryngopharyngeal reflux (LPR) 08/22/2017  .  Acute pharyngitis 08/22/2017  . Cervical pain (neck) 02/12/2017  . Dental crowns present   . Macromastia 10/29/2011    Past Medical history, Surgical history, Family history, Social history, Allergies and Medications have been entered into the medical record, reviewed and changed as needed.    Current Meds  Medication Sig  . aspirin EC 81 MG tablet Take 4 tablets (325 mg total) by mouth every morning. (Patient taking differently: Take 81 mg by mouth every morning. )  . B Complex Vitamins (VITAMIN B COMPLEX PO) Take by mouth.  . BEE POLLEN PO Take 1 tablet daily by mouth.  . chlorthalidone (HYGROTON) 25 MG tablet Take 1 tablet (25 mg total) by mouth every morning.  . Cholecalciferol (VITAMIN D3) 5000 units TABS 5,000 IU OTC vitamin D3 daily.  . COD LIVER OIL PO Take 1 capsule by mouth every morning.   . Iron-FA-B Cmp-C-Biot-Probiotic (FUSION PLUS) CAPS Take 1 capsule by mouth daily.  Marland Kitchen levothyroxine (SYNTHROID, LEVOTHROID) 100 MCG tablet Take 1 tablet (100 mcg total) by mouth daily before breakfast. Patient needs office visit for further refills.  . naproxen  sodium (ANAPROX) 220 MG tablet Take 220 mg by mouth 2 (two) times daily as needed (pain.).  . Turmeric 500 MG TABS Take by mouth.    Allergies:  Allergies  Allergen Reactions  . Erythromycin Nausea And Vomiting     Review of Systems:  A fourteen system review of systems was performed and found to be positive as per HPI.   Objective:   Blood pressure 126/80, pulse 65, temperature 97.7 F (36.5 C), height 5\' 1"  (1.549 m), weight 184 lb 11.2 oz (83.8 kg), SpO2 100 %. Body mass index is 34.9 kg/m. General:  Well Developed, well nourished, appropriate for stated age.  Neuro:  Alert and oriented,  extra-ocular muscles intact  HEENT:  Normocephalic, atraumatic, neck supple, no carotid bruits appreciated  Skin:  no gross rash, warm, pink. Cardiac:  RRR, S1 S2 Respiratory:  ECTA B/L and A/P, Not using accessory muscles, speaking in full sentences- unlabored. Vascular:  Ext warm, no cyanosis apprec.; cap RF less 2 sec. Psych:  No HI/SI, judgement and insight good, Euthymic mood. Full Affect.

## 2018-04-17 NOTE — Addendum Note (Signed)
Addended by: Lanier Prude D on: 04/17/2018 04:18 PM   Modules accepted: Orders

## 2018-05-12 ENCOUNTER — Encounter: Payer: Self-pay | Admitting: Family Medicine

## 2018-07-20 ENCOUNTER — Other Ambulatory Visit: Payer: Self-pay

## 2018-07-20 ENCOUNTER — Emergency Department (HOSPITAL_COMMUNITY)
Admission: EM | Admit: 2018-07-20 | Discharge: 2018-07-20 | Disposition: A | Discharge status: Home / Self Care | Payer: Medicare Other | Attending: Emergency Medicine | Admitting: Emergency Medicine

## 2018-07-20 ENCOUNTER — Emergency Department (HOSPITAL_COMMUNITY): Payer: Medicare Other

## 2018-07-20 DIAGNOSIS — Z79899 Other long term (current) drug therapy: Secondary | ICD-10-CM | POA: Insufficient documentation

## 2018-07-20 DIAGNOSIS — E039 Hypothyroidism, unspecified: Secondary | ICD-10-CM | POA: Diagnosis not present

## 2018-07-20 DIAGNOSIS — R51 Headache: Secondary | ICD-10-CM | POA: Diagnosis not present

## 2018-07-20 DIAGNOSIS — Z79890 Hormone replacement therapy (postmenopausal): Secondary | ICD-10-CM | POA: Insufficient documentation

## 2018-07-20 DIAGNOSIS — I10 Essential (primary) hypertension: Secondary | ICD-10-CM | POA: Diagnosis not present

## 2018-07-20 DIAGNOSIS — Z87891 Personal history of nicotine dependence: Secondary | ICD-10-CM | POA: Diagnosis not present

## 2018-07-20 DIAGNOSIS — E559 Vitamin D deficiency, unspecified: Secondary | ICD-10-CM | POA: Insufficient documentation

## 2018-07-20 DIAGNOSIS — R05 Cough: Secondary | ICD-10-CM | POA: Insufficient documentation

## 2018-07-20 DIAGNOSIS — J111 Influenza due to unidentified influenza virus with other respiratory manifestations: Secondary | ICD-10-CM

## 2018-07-20 DIAGNOSIS — R509 Fever, unspecified: Secondary | ICD-10-CM

## 2018-07-20 DIAGNOSIS — R0602 Shortness of breath: Secondary | ICD-10-CM | POA: Insufficient documentation

## 2018-07-20 DIAGNOSIS — Z20828 Contact with and (suspected) exposure to other viral communicable diseases: Secondary | ICD-10-CM | POA: Insufficient documentation

## 2018-07-20 DIAGNOSIS — Z7982 Long term (current) use of aspirin: Secondary | ICD-10-CM | POA: Insufficient documentation

## 2018-07-20 DIAGNOSIS — R69 Illness, unspecified: Secondary | ICD-10-CM

## 2018-07-20 LAB — INFLUENZA PANEL BY PCR (TYPE A & B)
Influenza A By PCR: NEGATIVE
Influenza B By PCR: NEGATIVE

## 2018-07-20 LAB — COMPREHENSIVE METABOLIC PANEL
ALK PHOS: 88 U/L (ref 38–126)
ALT: 26 U/L (ref 0–44)
AST: 40 U/L (ref 15–41)
Albumin: 3.7 g/dL (ref 3.5–5.0)
Anion gap: 11 (ref 5–15)
BUN: 13 mg/dL (ref 8–23)
CALCIUM: 9.3 mg/dL (ref 8.9–10.3)
CO2: 25 mmol/L (ref 22–32)
Chloride: 96 mmol/L — ABNORMAL LOW (ref 98–111)
Creatinine, Ser: 1.2 mg/dL — ABNORMAL HIGH (ref 0.44–1.00)
GFR calc non Af Amer: 47 mL/min — ABNORMAL LOW (ref >60)
GFR, EST AFRICAN AMERICAN: 55 mL/min — AB (ref >60)
Glucose, Bld: 110 mg/dL — ABNORMAL HIGH (ref 70–99)
Potassium: 3.9 mmol/L (ref 3.5–5.1)
Sodium: 132 mmol/L — ABNORMAL LOW (ref 135–145)
TOTAL PROTEIN: 6.8 g/dL (ref 6.5–8.1)
Total Bilirubin: 0.4 mg/dL (ref 0.3–1.2)

## 2018-07-20 LAB — CBC WITH DIFFERENTIAL/PLATELET
Abs Immature Granulocytes: 0.04 10*3/uL (ref 0.00–0.07)
BASOS ABS: 0 10*3/uL (ref 0.0–0.1)
Basophils Relative: 0 %
Eosinophils Absolute: 0.3 10*3/uL (ref 0.0–0.5)
Eosinophils Relative: 3 %
HCT: 40.5 % (ref 36.0–46.0)
Hemoglobin: 12.6 g/dL (ref 12.0–15.0)
IMMATURE GRANULOCYTES: 0 %
Lymphocytes Relative: 12 %
Lymphs Abs: 1.3 10*3/uL (ref 0.7–4.0)
MCH: 27 pg (ref 26.0–34.0)
MCHC: 31.1 g/dL (ref 30.0–36.0)
MCV: 86.7 fL (ref 80.0–100.0)
Monocytes Absolute: 0.6 10*3/uL (ref 0.1–1.0)
Monocytes Relative: 6 %
Neutro Abs: 8.2 10*3/uL — ABNORMAL HIGH (ref 1.7–7.7)
Neutrophils Relative %: 79 %
Platelets: 298 10*3/uL (ref 150–400)
RBC: 4.67 MIL/uL (ref 3.87–5.11)
RDW: 14.1 % (ref 11.5–15.5)
WBC: 10.4 10*3/uL (ref 4.0–10.5)
nRBC: 0 % (ref 0.0–0.2)

## 2018-07-20 LAB — LACTIC ACID, PLASMA: Lactic Acid, Venous: 1.5 mmol/L (ref 0.5–1.9)

## 2018-07-20 LAB — I-STAT TROPONIN, ED: Troponin i, poc: 0 ng/mL (ref 0.00–0.08)

## 2018-07-20 MED ORDER — ALBUTEROL SULFATE HFA 108 (90 BASE) MCG/ACT IN AERS: 2.0000 | INHALATION_SPRAY | Freq: Once | RESPIRATORY_TRACT | Status: DC

## 2018-07-20 MED ORDER — SODIUM CHLORIDE 0.9 % IV BOLUS
1000.0000 mL | Freq: Once | INTRAVENOUS | Status: AC
  Administered 2018-07-20: 1000 mL via INTRAVENOUS

## 2018-07-20 MED ORDER — ACETAMINOPHEN 500 MG PO TABS
1000.0000 mg | ORAL_TABLET | Freq: Once | ORAL | Status: AC
  Administered 2018-07-20: 1000 mg via ORAL
  Filled 2018-07-20: qty 2

## 2018-07-20 NOTE — ED Triage Notes (Signed)
Pt C/o cough, fever, SOB, sore throat , and headache that began yesterday ; pt states she just got back from a [place] cruise ; pt denies any chest pain ; pt alert and oriented x 4

## 2018-07-20 NOTE — Consult Note (Signed)
[facility] Medical Consultation  Deborah Petersen UKG:[id number] DOB: 10-05-1950 DOA: 07/20/2018 PCP: Mellody Dance, DO   Requesting physician: Dr. Darl Householder, [place] Date of consultation: 07/20/2018 Reason for consultation: Possible admit, COVID-19 rule out  Impression/Recommendations Active Problems:   Febrile illness   Febrile illness/URI symptoms: Patient with recent travel, fever, and cough suspicious for COVID-19 infection versus common URI.  She is currently oxygenating well on room air without respiratory distress.  She had a single low blood pressure of 99/66, otherwise has been stable.  She reports symptomatic improvement with oral Tylenol and IV fluid resuscitation. As there is no clear indication for hospital admission at this time, I have recommended discharge from the ED to home and advised patient to self isolate for 14 days and continue symptomatic supportive care with oral Tylenol, adequate hydration, etc.  I have asked her to avoid NSAIDs for the time being.  She may also consider holding her home chlorthalidone if there is concern for borderline low blood pressure.  Patient is agreeable and all her questions were answered.  I discussed the case with the ED physician, Dr. Darl Householder.   Chief Complaint: Fever, nonproductive cough, headache  HPI:  Deborah Petersen is a 68 year old female with medical history significant for hypothyroidism, hypertension, reported rheumatoid arthritis previously on methotrexate now controlled off of medication who presents to the ED with 2 days of fevers, nonproductive cough, headache, and nasal congestion.  Patient states on June 28, 2018 she [place] to [place] and embarked on a cruise from [place], [place] on June 29, 2018 to the [place] with return to [place] on July 06, 2018.  She flew back to [place] from [place] airport on July 07, 2018.    She said she has felt fine in the interim until 2 days ago when her  current symptoms began.  She reports some shallow breathing but denies any shortness of breath or dyspnea with deep inspiration.  She denies any chest pain, abdominal pain, nausea, vomiting, diarrhea.  She says she has been on a group chat with a number of people who were on her cruise some of whom have been tested for COVID-19 without any positive results per her report.  Only medications include Synthroid for hypothyroidism and chlorthalidone for blood pressure.  She reports taking occasional Aleve for aches and pains.  In the ED, initial vitals showed BP 135/78, pulse 86, RR 19, temp 100.5 Fahrenheit, SPO2 90% on room air.  Portable chest x-ray was without focal consolidation, edema, or effusion.  Radiology read reports coarsened interstitial markings with chronic features and no active disease.  Labs are notable for creatinine 1.2, negative influenza panel.  CBC is largely unremarkable.  I-STAT troponin is 0.00.  Blood cultures, respiratory viral panel, and novel coronavirus NAA were collected and pending.  She was given 2 L of normal saline and oral Tylenol with reported symptomatic improvement.  The hospitalist service was consulted to evaluate for potential admission for COVID-19 rule out.  Review of Systems:  As per HPI otherwise 10 point review of systems negative.   Past Medical History:  Diagnosis Date  . Anemia   . Brain tumor ([place]) 2005  . Dental crowns present   . Hypothyroidism   . Macromastia 10/2011  . Rheumatoid arthritis(714.0)    no current problems or meds.  . Vitamin D deficiency    Past Surgical History:  Procedure Laterality Date  . ABDOMINAL HYSTERECTOMY     partial  . ANTERIOR  AND POSTERIOR REPAIR  2001  . APPENDECTOMY    . BRAIN MENINGIOMA EXCISION  2004  . BREAST REDUCTION SURGERY  11/19/2011   Procedure: MAMMARY REDUCTION  (BREAST);  Surgeon: Cristine Polio, MD;  Location: [facility];  Service: Plastics;  Laterality: Bilateral;  . FOOT  SURGERY    . HERNIA REPAIR    . KNEE ARTHROSCOPY Left 06/09/2015   Procedure: LEFT KNEE ARTHROSCOPY WITH PARTIAL MEDIAL AND LATERAL MENISECTOMY AND DEBRIDEMENT;  Surgeon: Susa Day, MD;  Location: [facility];  Service: Orthopedics;  Laterality: Left;  . KNEE SURGERY    . TONSILLECTOMY     Social History:  reports that she has quit smoking. She quit smokeless tobacco use about 42 years ago. She reports that she does not drink alcohol or use drugs.  Allergies  Allergen Reactions  . Erythromycin Nausea And Vomiting   No family history on file.  Prior to Admission medications   Medication Sig Start Date End Date Taking? Authorizing Provider  aspirin EC 81 MG tablet Take 4 tablets (325 mg total) by mouth every morning. Patient taking differently: Take 81 mg by mouth every morning.  06/09/15  Yes Susa Day, MD  chlorthalidone (HYGROTON) 25 MG tablet Take 1 tablet (25 mg total) by mouth every morning. 12/17/17  Yes Opalski, Deborah, DO  Cholecalciferol (VITAMIN D3) 5000 units TABS 5,000 IU OTC vitamin D3 daily. Patient taking differently: Take 5,000 Units by mouth daily.  12/17/17  Yes Opalski, Deborah, DO  COD LIVER OIL PO Take 1 capsule by mouth every morning.    Yes [provider]  Iron-FA-B Cmp-C-Biot-Probiotic (FUSION PLUS) CAPS Take 1 capsule by mouth daily. 12/17/17  Yes Opalski, Neoma Laming, DO  levothyroxine (SYNTHROID, LEVOTHROID) 100 MCG tablet Take 1 tablet (100 mcg total) by mouth daily before breakfast. Patient needs office visit for further refills. 12/17/17  Yes Opalski, Neoma Laming, DO  naproxen sodium (ANAPROX) 220 MG tablet Take 220 mg by mouth 2 (two) times daily as needed (pain.).   Yes [provider]  Turmeric 500 MG TABS Take 1 tablet by mouth daily.    Yes [provider]   Physical Exam: Blood pressure 121/60, pulse 75, temperature (!) 100.5 F (38.1 C), temperature source Oral, resp. rate 13, height 5\' 2"  (1.575 m), weight 77.1 kg, SpO2 97  %. Vitals:   07/20/18 2130 07/20/18 2145  BP: 107/77 121/60  Pulse: 76 75  Resp: 15 13  Temp:    SpO2: 98% 97%     General: Sitting up in bed, no acute distress, calm, comfortable  Eyes: PERRL, EOMI  ENT: Moist oral mucosa without exudate  Neck: Supple  Cardiovascular: Regular rate and rhythm without murmur  Respiratory: Faint inspiratory crackles left lung base, no wheezing appreciated  Abdomen: Soft, nontender  Skin: Warm and dry  Musculoskeletal: Range of motion intact  Psychiatric: Alert and oriented to person, place, time.  Normal mood.  Neurologic: No focal deficit  Labs on Admission:  Basic Metabolic Panel: Recent Labs  Lab 07/20/18 1845  NA 132*  K 3.9  CL 96*  CO2 25  GLUCOSE 110*  BUN 13  CREATININE 1.20*  CALCIUM 9.3   Liver Function Tests: Recent Labs  Lab 07/20/18 1845  AST 40  ALT 26  ALKPHOS 88  BILITOT 0.4  PROT 6.8  ALBUMIN 3.7   No results for input(s): LIPASE, AMYLASE in the last 168 hours. No results for input(s): AMMONIA in the last 168 hours. CBC: Recent Labs  Lab 07/20/18 1845  WBC 10.4  NEUTROABS 8.2*  HGB 12.6  HCT 40.5  MCV 86.7  PLT 298   Cardiac Enzymes: No results for input(s): CKTOTAL, CKMB, CKMBINDEX, TROPONINI in the last 168 hours. BNP: Invalid input(s): POCBNP CBG: No results for input(s): GLUCAP in the last 168 hours.  Radiological Exams on Admission: Dg Chest Port 1 View  Result Date: 07/20/2018 CLINICAL DATA:  Cough and shortness of breath.  Fever. EXAM: PORTABLE CHEST 1 VIEW COMPARISON:  None. FINDINGS: 1846 hours. The lungs are clear without focal pneumonia, edema, pneumothorax or pleural effusion. Interstitial markings are diffusely coarsened with chronic features. Cardiopericardial silhouette is at upper limits of normal for size. The visualized bony structures of the thorax are intact. Telemetry leads overlie the chest. IMPRESSION: No active disease. Electronically Signed   By: Misty Stanley  M.D.   On: 07/20/2018 19:45    EKG: Independently reviewed.  Sinus rhythm, borderline IVCD.   Zada Finders, MD [facility]  If 7PM-7AM, please contact night-coverage [email or web address] 07/20/2018, 10:02 PM

## 2018-07-20 NOTE — ED Notes (Addendum)
Lab notified to add Corona Virus test on pt.'s nasal swab specimen .

## 2018-07-20 NOTE — ED Provider Notes (Signed)
[facility] EMERGENCY DEPARTMENT Provider Note   CSN: [id number] Arrival date & time: 07/20/18  1816    History   Chief Complaint Chief Complaint  Patient presents with   Cough   Fever    HPI Deborah Petersen is a 68 y.o. female history of rheumatoid arthritis not currently on meds, here presenting with shortness of breath, fever, cough.  Patient states that she flew to [place] [facility] on February 29.  The following day, March 1, she went on a cruise to the [place] and went back to [place] on the eighth.  On March 9th, she flew back here.  She was under quarantine and she felt fine until 2 days ago.  She started having some headaches as well as productive cough and shortness of breath.  She started running fever 101 since yesterday.  She was concerned that she may have the novel coronavirus.  She denies any previous history of COPD or asthma and denies smoking.       The history is provided by the patient.    Past Medical History:  Diagnosis Date   Anemia    Brain tumor ([place]) 2005   Dental crowns present    Hypothyroidism    Macromastia 10/2011   Rheumatoid arthritis(714.0)    no current problems or meds.   Vitamin D deficiency     Patient Active Problem List   Diagnosis Date Noted   History of abdominal hernia W MESH REPAIR 2003 04/17/2018   Itching with irritation- abdominal wall overlaying mesh 04/17/2018   Hypertension 12/17/2017   Chronic fatigue 12/17/2017   Status post R bunionectomy- 4/19 12/17/2017   Laryngopharyngeal reflux (LPR) 08/22/2017   Acute pharyngitis 08/22/2017   h/o Elevated LDL cholesterol level 07/25/2017   Cervical pain (neck) 02/12/2017   Hypothyroidism 10/03/2016   Cyst of meniscus of left knee- s/p sx Dr Tonita Cong- [facility] 10/03/2016   S/P hysterectomy- no cervix 10/03/2016   History of non anemic vitamin B12 deficiency 10/03/2016   BMI 35.0-35.9,adult 10/03/2016   h/o RA (rheumatoid arthritis) - cured  by faith and prayer    Dental crowns present    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    Vitamin D deficiency    Fe Def Anemia    Macromastia 10/29/2011    Past Surgical History:  Procedure Laterality Date   ABDOMINAL HYSTERECTOMY     partial   ANTERIOR AND POSTERIOR REPAIR  2001   APPENDECTOMY     BRAIN MENINGIOMA EXCISION  2004   BREAST REDUCTION SURGERY  11/19/2011   Procedure: MAMMARY REDUCTION  (BREAST);  Surgeon: Cristine Polio, MD;  Location: [facility];  Service: Plastics;  Laterality: Bilateral;   FOOT SURGERY     HERNIA REPAIR     KNEE ARTHROSCOPY Left 06/09/2015   Procedure: LEFT KNEE ARTHROSCOPY WITH PARTIAL MEDIAL AND LATERAL MENISECTOMY AND DEBRIDEMENT;  Surgeon: Susa Day, MD;  Location: [facility];  Service: Orthopedics;  Laterality: Left;   KNEE SURGERY     TONSILLECTOMY       OB History    Gravida  3   Para  3   Term  3   Preterm      AB      Living  3     SAB      TAB      Ectopic      Multiple      Live Births  Home Medications    Prior to Admission medications   Medication Sig Start Date End Date Taking? Authorizing Provider  aspirin EC 81 MG tablet Take 4 tablets (325 mg total) by mouth every morning. Patient taking differently: Take 81 mg by mouth every morning.  06/09/15  Yes Susa Day, MD  chlorthalidone (HYGROTON) 25 MG tablet Take 1 tablet (25 mg total) by mouth every morning. 12/17/17  Yes Opalski, Deborah, DO  Cholecalciferol (VITAMIN D3) 5000 units TABS 5,000 IU OTC vitamin D3 daily. Patient taking differently: Take 5,000 Units by mouth daily.  12/17/17  Yes Opalski, Deborah, DO  COD LIVER OIL PO Take 1 capsule by mouth every morning.    Yes [provider]  Iron-FA-B Cmp-C-Biot-Probiotic (FUSION PLUS) CAPS Take 1 capsule by mouth daily. 12/17/17  Yes Opalski, Neoma Laming, DO  levothyroxine (SYNTHROID, LEVOTHROID) 100 MCG tablet Take 1 tablet  (100 mcg total) by mouth daily before breakfast. Patient needs office visit for further refills. 12/17/17  Yes Opalski, Neoma Laming, DO  naproxen sodium (ANAPROX) 220 MG tablet Take 220 mg by mouth 2 (two) times daily as needed (pain.).   Yes [provider]  Turmeric 500 MG TABS Take 1 tablet by mouth daily.    Yes [provider]    Family History No family history on file.  Social History Social History   Tobacco Use   Smoking status: Former Smoker   Smokeless tobacco: Former Systems developer    Quit date: 04/30/1976  Substance Use Topics   Alcohol use: No   Drug use: No     Allergies   Erythromycin   Review of Systems Review of Systems  Constitutional: Positive for fever.  Respiratory: Positive for cough and shortness of breath.   All other systems reviewed and are negative.    Physical Exam Updated Vital Signs BP 99/66    Pulse 75    Temp (!) 100.5 F (38.1 C) (Oral)    Resp 20    Ht 5\' 2"  (1.575 m)    Wt 77.1 kg    SpO2 98%    BMI 31.09 kg/m   Physical Exam Vitals signs and nursing note reviewed.  Constitutional:      Appearance: Normal appearance.     Comments: Anxious   HENT:     Head: Normocephalic.     Mouth/Throat:     Mouth: Mucous membranes are moist.  Eyes:     Extraocular Movements: Extraocular movements intact.     Pupils: Pupils are equal, round, and reactive to light.  Neck:     Musculoskeletal: Normal range of motion.  Cardiovascular:     Rate and Rhythm: Normal rate and regular rhythm.     Pulses: Normal pulses.     Heart sounds: Normal heart sounds.  Pulmonary:     Comments: Slightly tachypneic, no crackles or wheezing  Abdominal:     General: Abdomen is flat.     Palpations: Abdomen is soft.  Musculoskeletal: Normal range of motion.  Skin:    General: Skin is warm.     Capillary Refill: Capillary refill takes less than 2 seconds.  Neurological:     General: No focal deficit present.     Mental Status: She is alert and  oriented to person, place, and time.  Psychiatric:        Mood and Affect: Mood normal.        Behavior: Behavior normal.      ED Treatments / Results  Labs (all labs ordered  are listed, but only abnormal results are displayed) Labs Reviewed  CBC WITH DIFFERENTIAL/PLATELET - Abnormal; Notable for the following components:      Result Value   Neutro Abs 8.2 (*)    All other components within normal limits  COMPREHENSIVE METABOLIC PANEL - Abnormal; Notable for the following components:   Sodium 132 (*)    Chloride 96 (*)    Glucose, Bld 110 (*)    Creatinine, Ser 1.20 (*)    GFR calc non Af Amer 47 (*)    GFR calc Af Amer 55 (*)    All other components within normal limits  CULTURE, BLOOD (ROUTINE X 2)  CULTURE, BLOOD (ROUTINE X 2)  RESPIRATORY PANEL BY PCR  NOVEL CORONAVIRUS, NAA (HOSPITAL ORDER, SEND-OUT TO REF LAB)  LACTIC ACID, PLASMA  INFLUENZA PANEL BY PCR (TYPE A & B)  I-STAT TROPONIN, ED    EKG EKG Interpretation  Date/Time:  Sunday July 20 2018 18:40:24 EDT Ventricular Rate:  83 PR Interval:    QRS Duration: 113 QT Interval:  356 QTC Calculation: 419 R Axis:   16 Text Interpretation:  Sinus rhythm Borderline intraventricular conduction delay Low voltage, precordial leads No previous ECGs available Confirmed by Wandra Arthurs ([id number]) on 07/20/2018 6:57:00 PM   Radiology Dg Chest Port 1 View  Result Date: 07/20/2018 CLINICAL DATA:  Cough and shortness of breath.  Fever. EXAM: PORTABLE CHEST 1 VIEW COMPARISON:  None. FINDINGS: 1846 hours. The lungs are clear without focal pneumonia, edema, pneumothorax or pleural effusion. Interstitial markings are diffusely coarsened with chronic features. Cardiopericardial silhouette is at upper limits of normal for size. The visualized bony structures of the thorax are intact. Telemetry leads overlie the chest. IMPRESSION: No active disease. Electronically Signed   By: Misty Stanley M.D.   On: 07/20/2018 19:45     Procedures Procedures (including critical care time)  Medications Ordered in ED Medications  albuterol (PROVENTIL HFA;VENTOLIN HFA) 108 (90 Base) MCG/ACT inhaler 2 puff (has no administration in time range)  acetaminophen (TYLENOL) tablet 1,000 mg (1,000 mg Oral Given 07/20/18 1856)  sodium chloride 0.9 % bolus 1,000 mL (0 mLs Intravenous Stopped 07/20/18 2002)  sodium chloride 0.9 % bolus 1,000 mL (1,000 mLs Intravenous New Bag/Given 07/20/18 2102)     Initial Impression / Assessment and Plan / ED Course  I have reviewed the triage vital signs and the nursing notes.  Pertinent labs & imaging results that were available during my care of the patient were reviewed by me and considered in my medical decision making (see chart for details).        Deborah Petersen is a 68 y.o. female here with cough, shortness of breath, fever. She had recent travel with a cruise and flight. Febrile and slightly tachypneic and hypotensive in the ED. Will initiate sepsis workup and consider COVID pathway.   9:19 PM Labs unremarkable. CXR clear. Mild AKI on chemistry. She is slightly hypotensive 99/66 currently. Slightly tachypneic. Flu negative. RVP and COVID sent. Will admit for observation.   9:41 PM Dr. Posey Pronto from hospitalist saw patient and recommend discharge with supportive care. Patient aware that she will need to be quarantined for 2 weeks and health department will follow up with her COVID results.    Final Clinical Impressions(s) / ED Diagnoses   Final diagnoses:  None    ED Discharge Orders    None       Drenda Freeze, MD 07/20/18 2150

## 2018-07-20 NOTE — Discharge Instructions (Signed)
Take tylenol as needed for fever.   Stay hydrated.   You will be contacted regarding your COVID results.   Self quarantine for 2 weeks.   Return to ER if you have worse fever, shortness of breath, trouble breathing.

## 2018-07-21 LAB — RESPIRATORY PANEL BY PCR
Adenovirus: NOT DETECTED
Bordetella pertussis: NOT DETECTED
Chlamydophila pneumoniae: NOT DETECTED
Coronavirus 229E: NOT DETECTED
Coronavirus HKU1: NOT DETECTED
Coronavirus NL63: NOT DETECTED
Coronavirus OC43: NOT DETECTED
INFLUENZA B-RVPPCR: NOT DETECTED
Influenza A: NOT DETECTED
Metapneumovirus: NOT DETECTED
Mycoplasma pneumoniae: NOT DETECTED
Parainfluenza Virus 1: NOT DETECTED
Parainfluenza Virus 2: NOT DETECTED
Parainfluenza Virus 3: NOT DETECTED
Parainfluenza Virus 4: NOT DETECTED
Respiratory Syncytial Virus: NOT DETECTED
Rhinovirus / Enterovirus: DETECTED — AB

## 2018-07-22 ENCOUNTER — Telehealth: Payer: Self-pay | Admitting: Family Medicine

## 2018-07-22 NOTE — Telephone Encounter (Signed)
Spoke to the patient and she states that she was seen in the ER on Sunday 07/20/2018 for fever, cough (nonproductive) and slight SOB.  Patient also states that she has had headaches, sinus pressure/pain.  Patient had recent travel outside of the country and into another state. Patient states that she is still having symptoms and fever of 100 to 101 without tylenol.  Patient was tested for the COVID 19 virus and are waiting on results .  Advised the patient that we due to symptoms and recent travel we need to wait on this result before given any types of antibiotics.  Patient advised to increase water/fluids, rest, continue tylenol - no ibuprofen.  If respiratory symptoms develop - increased SOB, breathing difficulties, etc to seek immediate care at ER. MPulliam, CMA/RT(R)

## 2018-07-22 NOTE — Telephone Encounter (Signed)
Patient is requesting call back from nurse regarding recent lab results, she has a few questions regarding these labs and the treatment plan to follow. Please contact patient at [phone]

## 2018-07-24 ENCOUNTER — Telehealth: Payer: Self-pay | Admitting: Family Medicine

## 2018-07-24 NOTE — Telephone Encounter (Signed)
error 

## 2018-07-25 ENCOUNTER — Telehealth: Payer: Self-pay

## 2018-07-25 LAB — CULTURE, BLOOD (ROUTINE X 2)
CULTURE: NO GROWTH
Culture: NO GROWTH
Special Requests: ADEQUATE
Special Requests: ADEQUATE

## 2018-07-25 NOTE — Telephone Encounter (Signed)
Error. MPulliam, CMA/RT(R)  

## 2018-07-28 ENCOUNTER — Telehealth: Payer: Self-pay | Admitting: Family Medicine

## 2018-07-31 LAB — NOVEL CORONAVIRUS, NAA (HOSP ORDER, SEND-OUT TO REF LAB; TAT 18-24 HRS): SARS-CoV-2, NAA: NOT DETECTED

## 2018-08-05 ENCOUNTER — Telehealth: Payer: Self-pay

## 2018-08-05 NOTE — Telephone Encounter (Signed)
Called patient to follow up.  Patient is aware of her COVID results and states that symptoms have resolved.  Patient will follow up if needed. MPulliam, CMA/RT(R)

## 2018-08-06 NOTE — Telephone Encounter (Signed)
covid-19 testing results finally came to office via fax after 12 days.  They were negative, Melissa stated she would inform pt

## 2018-12-05 ENCOUNTER — Encounter (HOSPITAL_COMMUNITY): Payer: Self-pay | Admitting: Emergency Medicine

## 2018-12-05 ENCOUNTER — Other Ambulatory Visit: Payer: Self-pay

## 2018-12-05 ENCOUNTER — Encounter (HOSPITAL_COMMUNITY): Payer: Self-pay

## 2018-12-05 ENCOUNTER — Emergency Department (HOSPITAL_COMMUNITY)
Admission: EM | Admit: 2018-12-05 | Discharge: 2018-12-05 | Disposition: A | Discharge status: Home / Self Care | Payer: Medicare Other | Attending: Emergency Medicine | Admitting: Emergency Medicine

## 2018-12-05 ENCOUNTER — Ambulatory Visit (HOSPITAL_COMMUNITY)
Admission: EM | Admit: 2018-12-05 | Discharge: 2018-12-05 | Disposition: A | Discharge status: Home / Self Care | Payer: Medicare Other | Source: Home / Self Care | Attending: Family Medicine | Admitting: Family Medicine

## 2018-12-05 DIAGNOSIS — R109 Unspecified abdominal pain: Secondary | ICD-10-CM | POA: Diagnosis present

## 2018-12-05 DIAGNOSIS — Z5321 Procedure and treatment not carried out due to patient leaving prior to being seen by health care provider: Secondary | ICD-10-CM | POA: Insufficient documentation

## 2018-12-05 DIAGNOSIS — R1011 Right upper quadrant pain: Secondary | ICD-10-CM | POA: Diagnosis not present

## 2018-12-05 LAB — COMPREHENSIVE METABOLIC PANEL
ALT: 20 U/L (ref 0–44)
AST: 24 U/L (ref 15–41)
Albumin: 3.9 g/dL (ref 3.5–5.0)
Alkaline Phosphatase: 88 U/L (ref 38–126)
Anion gap: 11 (ref 5–15)
BUN: 14 mg/dL (ref 8–23)
CO2: 25 mmol/L (ref 22–32)
Calcium: 9.6 mg/dL (ref 8.9–10.3)
Chloride: 97 mmol/L — ABNORMAL LOW (ref 98–111)
Creatinine, Ser: 0.89 mg/dL (ref 0.44–1.00)
GFR calc Af Amer: >60 mL/min (ref >60)
GFR calc non Af Amer: >60 mL/min (ref >60)
Glucose, Bld: 141 mg/dL — ABNORMAL HIGH (ref 70–99)
Potassium: 3.5 mmol/L (ref 3.5–5.1)
Sodium: 133 mmol/L — ABNORMAL LOW (ref 135–145)
Total Bilirubin: 0.4 mg/dL (ref 0.3–1.2)
Total Protein: 7.2 g/dL (ref 6.5–8.1)

## 2018-12-05 LAB — CBC
HCT: 41.1 % (ref 36.0–46.0)
Hemoglobin: 13.4 g/dL (ref 12.0–15.0)
MCH: 27.6 pg (ref 26.0–34.0)
MCHC: 32.6 g/dL (ref 30.0–36.0)
MCV: 84.7 fL (ref 80.0–100.0)
Platelets: 327 10*3/uL (ref 150–400)
RBC: 4.85 MIL/uL (ref 3.87–5.11)
RDW: 13.5 % (ref 11.5–15.5)
WBC: 12.6 10*3/uL — ABNORMAL HIGH (ref 4.0–10.5)
nRBC: 0 % (ref 0.0–0.2)

## 2018-12-05 LAB — LIPASE, BLOOD: Lipase: 38 U/L (ref 11–51)

## 2018-12-05 MED ORDER — SODIUM CHLORIDE 0.9% FLUSH: 3.0000 mL | Freq: Once | INTRAVENOUS | Status: DC

## 2018-12-05 NOTE — Discharge Instructions (Signed)
Please go to emergency room for further evaluation of your gall bladder

## 2018-12-05 NOTE — ED Notes (Signed)
Pt stated she is leaving. 

## 2018-12-05 NOTE — ED Notes (Signed)
Upon re-assessment this tech asked pt to provide urine sample. Pt stated "I do not want to, im getting ready to leave because this is ridiculous".

## 2018-12-05 NOTE — ED Notes (Signed)
Patient is being discharged from the Urgent [facility] and sent to the Emergency Department via wheelchair by staff. Per [place], PA, patient is stable but in need of higher level of care due to acute abdominal pain. Patient is aware and verbalizes understanding of plan of care.  Vitals:   12/05/18 1518  BP: 129/88  Pulse: 66  Resp: 16  Temp: 98.4 F (36.9 C)  SpO2: 99%

## 2018-12-05 NOTE — ED Triage Notes (Signed)
Patient presents to Urgent Care with complaints of RLQ abdominal pain and vomiting since this morning. Patient reports she thinks it is her gallbladder.

## 2018-12-05 NOTE — ED Triage Notes (Signed)
Pt here for eval of RLQ abdominal pain and vomiting since this morning.

## 2018-12-05 NOTE — ED Provider Notes (Signed)
[facility]    CSN: [id number] Arrival date & time: 12/05/18  1450     History   Chief Complaint Chief Complaint  Patient presents with  . Abdominal Pain  . Emesis    HPI Deborah Petersen is a 68 y.o. female history of previous appendectomy, hysterectomy, gastric bypass and hernia mesh repair presenting today for evaluation of abdominal pain.  Patient states that around 830 she developed a dull ache in the right side of her abdomen.  She had associated nausea, proceeded to vomiting and has vomited 5-6 times since.  Denies any fevers.  States that this morning she did have a [employer] sausage and cheese biscuit with jalapeno pimento cheese.  She also notes that over the past 2 days she has had an ache in her right shoulder.  She denies any diarrhea or changes in bowel movements had a normal bowel movement this morning without straining.  She tried to Tums along with 2 Tylenols without relief.  Denies urinary symptoms or pelvic symptoms including denying dysuria, increased frequency, hematuria, abnormal discharge.  Denies history of kidney stones.  HPI  Past Medical History:  Diagnosis Date  . Anemia   . Brain tumor ([place]) 2005  . Dental crowns present   . Hypothyroidism   . Macromastia 10/2011  . Rheumatoid arthritis(714.0)    no current problems or meds.  . Vitamin D deficiency     Patient Active Problem List   Diagnosis Date Noted  . Febrile illness 07/20/2018  . History of abdominal hernia W MESH REPAIR 2003 04/17/2018  . Itching with irritation- abdominal wall overlaying mesh 04/17/2018  . Hypertension 12/17/2017  . Chronic fatigue 12/17/2017  . Status post R bunionectomy- 4/19 12/17/2017  . Laryngopharyngeal reflux (LPR) 08/22/2017  . Acute pharyngitis 08/22/2017  . h/o Elevated LDL cholesterol level 07/25/2017  . Cervical pain (neck) 02/12/2017  . Hypothyroidism 10/03/2016  . Cyst of meniscus of left knee- s/p sx Dr Tonita Cong- [facility] 10/03/2016  . S/P  hysterectomy- no cervix 10/03/2016  . History of non anemic vitamin B12 deficiency 10/03/2016  . BMI 35.0-35.9,adult 10/03/2016  . h/o RA (rheumatoid arthritis) - cured by faith and prayer   . Dental crowns present   . 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 Vitamin D deficiency   . Fe Def Anemia   . Macromastia 10/29/2011    Past Surgical History:  Procedure Laterality Date  . ABDOMINAL HYSTERECTOMY     partial  . ANTERIOR AND POSTERIOR REPAIR  2001  . APPENDECTOMY    . BRAIN MENINGIOMA EXCISION  2004  . BREAST REDUCTION SURGERY  11/19/2011   Procedure: MAMMARY REDUCTION  (BREAST);  Surgeon: Cristine Polio, MD;  Location: [facility];  Service: Plastics;  Laterality: Bilateral;  . FOOT SURGERY    . HERNIA REPAIR    . KNEE ARTHROSCOPY Left 06/09/2015   Procedure: LEFT KNEE ARTHROSCOPY WITH PARTIAL MEDIAL AND LATERAL MENISECTOMY AND DEBRIDEMENT;  Surgeon: Susa Day, MD;  Location: [facility];  Service: Orthopedics;  Laterality: Left;  . KNEE SURGERY    . TONSILLECTOMY      OB History    Gravida  3   Para  3   Term  3   Preterm      AB      Living  3     SAB      TAB      Ectopic      Multiple  Live Births               Home Medications    Prior to Admission medications   Medication Sig Start Date End Date Taking? Authorizing Provider  aspirin EC 81 MG tablet Take 4 tablets (325 mg total) by mouth every morning. Patient taking differently: Take 81 mg by mouth every morning.  06/09/15   Susa Day, MD  chlorthalidone (HYGROTON) 25 MG tablet Take 1 tablet (25 mg total) by mouth every morning. 12/17/17   Opalski, Neoma Laming, DO  Cholecalciferol (VITAMIN D3) 5000 units TABS 5,000 IU OTC vitamin D3 daily. Patient taking differently: Take 5,000 Units by mouth daily.  12/17/17   Opalski, Deborah, DO  COD LIVER OIL PO Take 1 capsule by mouth every morning.     [provider]  Iron-FA-B  Cmp-C-Biot-Probiotic (FUSION PLUS) CAPS Take 1 capsule by mouth daily. 12/17/17   Opalski, Neoma Laming, DO  levothyroxine (SYNTHROID, LEVOTHROID) 100 MCG tablet Take 1 tablet (100 mcg total) by mouth daily before breakfast. Patient needs office visit for further refills. 12/17/17   Opalski, Neoma Laming, DO  naproxen sodium (ANAPROX) 220 MG tablet Take 220 mg by mouth 2 (two) times daily as needed (pain.).    [provider]  Turmeric 500 MG TABS Take 1 tablet by mouth daily.     [provider]    Family History Family History  Family history unknown: Yes    Social History Social History   Tobacco Use  . Smoking status: Former Research scientist (life sciences)  . Smokeless tobacco: Former Systems developer    Quit date: 04/30/1976  Substance Use Topics  . Alcohol use: No  . Drug use: No     Allergies   Erythromycin   Review of Systems Review of Systems  Constitutional: Negative for activity change, appetite change, chills, fatigue and fever.  HENT: Negative for congestion, ear pain, rhinorrhea, sinus pressure, sore throat and trouble swallowing.   Eyes: Negative for discharge and redness.  Respiratory: Negative for cough, chest tightness and shortness of breath.   Cardiovascular: Negative for chest pain.  Gastrointestinal: Positive for abdominal pain, nausea and vomiting. Negative for diarrhea.  Musculoskeletal: Negative for myalgias.  Skin: Negative for rash.  Neurological: Negative for dizziness, light-headedness and headaches.     Physical Exam Triage Vital Signs ED Triage Vitals  Enc Vitals Group     BP 12/05/18 1518 129/88     Pulse Rate 12/05/18 1518 66     Resp 12/05/18 1518 16     Temp 12/05/18 1518 98.4 F (36.9 C)     Temp Source 12/05/18 1518 Oral     SpO2 12/05/18 1518 99 %     Weight --      Height --      Head Circumference --      Peak Flow --      Pain Score 12/05/18 1516 8     Pain Loc --      Pain Edu? --      Excl. in [place]? --    No data found.  Updated Vital Signs BP  129/88 (BP Location: Left Arm)   Pulse 66   Temp 98.4 F (36.9 C) (Oral)   Resp 16   SpO2 99%   Visual Acuity Right Eye Distance:   Left Eye Distance:   Bilateral Distance:    Right Eye Near:   Left Eye Near:    Bilateral Near:     Physical Exam Vitals signs and nursing note reviewed.  Constitutional:      Appearance: She is well-developed.     Comments: No acute distress  HENT:     Head: Normocephalic and atraumatic.     Nose: Nose normal.  Eyes:     Conjunctiva/sclera: Conjunctivae normal.  Neck:     Musculoskeletal: Neck supple.  Cardiovascular:     Rate and Rhythm: Normal rate.  Pulmonary:     Effort: Pulmonary effort is normal. No respiratory distress.     Comments: Breathing comfortably at rest, CTABL, no wheezing, rales or other adventitious sounds auscultated Abdominal:     General: There is no distension.     Tenderness: There is abdominal tenderness.     Comments: Abdomen soft, nondistended, tenderness to palpation in right lower quadrant and right upper quadrant, positive Murphy's  Musculoskeletal: Normal range of motion.  Skin:    General: Skin is warm and dry.  Neurological:     Mental Status: She is alert and oriented to person, place, and time.      UC Treatments / Results  Labs (all labs ordered are listed, but only abnormal results are displayed) Labs Reviewed - No data to display  EKG   Radiology No results found.  Procedures Procedures (including critical care time)  Medications Ordered in UC Medications - No data to display  Initial Impression / Assessment and Plan / UC Course  I have reviewed the triage vital signs and the nursing notes.  Pertinent labs & imaging results that were available during my care of the patient were reviewed by me and considered in my medical decision making (see chart for details).     Acute onset of abdominal pain today with vomiting, positive Murphy's.  Recommending further evaluation emergency  room to rule out cholecystitis.  Patient verbalized understanding of concern.  Patient stable on discharge, transported to ED via wheelchair with nursing staff.  Final Clinical Impressions(s) / UC Diagnoses   Final diagnoses:  Right upper quadrant abdominal pain     Discharge Instructions     Please go to emergency room for further evaluation of your gall bladder    ED Prescriptions    None     Controlled Substance Prescriptions [place] Controlled Substance Registry consulted? Not Applicable   Janith L[place], [place] 12/05/18 1549

## 2018-12-07 ENCOUNTER — Observation Stay (HOSPITAL_BASED_OUTPATIENT_CLINIC_OR_DEPARTMENT_OTHER)
Admission: EM | Admit: 2018-12-07 | Discharge: 2018-12-09 | Disposition: A | Discharge status: Home / Self Care | Payer: Medicare Other | Attending: Surgery | Admitting: Surgery

## 2018-12-07 ENCOUNTER — Encounter (HOSPITAL_BASED_OUTPATIENT_CLINIC_OR_DEPARTMENT_OTHER): Payer: Self-pay | Admitting: Emergency Medicine

## 2018-12-07 ENCOUNTER — Emergency Department (HOSPITAL_BASED_OUTPATIENT_CLINIC_OR_DEPARTMENT_OTHER): Payer: Medicare Other

## 2018-12-07 ENCOUNTER — Other Ambulatory Visit: Payer: Self-pay

## 2018-12-07 DIAGNOSIS — R918 Other nonspecific abnormal finding of lung field: Secondary | ICD-10-CM

## 2018-12-07 DIAGNOSIS — I10 Essential (primary) hypertension: Secondary | ICD-10-CM | POA: Insufficient documentation

## 2018-12-07 DIAGNOSIS — K8000 Calculus of gallbladder with acute cholecystitis without obstruction: Secondary | ICD-10-CM | POA: Diagnosis not present

## 2018-12-07 DIAGNOSIS — Z87891 Personal history of nicotine dependence: Secondary | ICD-10-CM | POA: Insufficient documentation

## 2018-12-07 DIAGNOSIS — E039 Hypothyroidism, unspecified: Secondary | ICD-10-CM | POA: Insufficient documentation

## 2018-12-07 DIAGNOSIS — E559 Vitamin D deficiency, unspecified: Secondary | ICD-10-CM | POA: Diagnosis not present

## 2018-12-07 DIAGNOSIS — Z20828 Contact with and (suspected) exposure to other viral communicable diseases: Secondary | ICD-10-CM | POA: Insufficient documentation

## 2018-12-07 DIAGNOSIS — Z79890 Hormone replacement therapy (postmenopausal): Secondary | ICD-10-CM | POA: Diagnosis not present

## 2018-12-07 DIAGNOSIS — K810 Acute cholecystitis: Secondary | ICD-10-CM | POA: Diagnosis present

## 2018-12-07 DIAGNOSIS — Z79899 Other long term (current) drug therapy: Secondary | ICD-10-CM | POA: Diagnosis not present

## 2018-12-07 DIAGNOSIS — D509 Iron deficiency anemia, unspecified: Secondary | ICD-10-CM | POA: Insufficient documentation

## 2018-12-07 DIAGNOSIS — Z9884 Bariatric surgery status: Secondary | ICD-10-CM | POA: Insufficient documentation

## 2018-12-07 DIAGNOSIS — Z7982 Long term (current) use of aspirin: Secondary | ICD-10-CM | POA: Diagnosis not present

## 2018-12-07 DIAGNOSIS — K819 Cholecystitis, unspecified: Secondary | ICD-10-CM

## 2018-12-07 LAB — COMPREHENSIVE METABOLIC PANEL
ALT: 29 U/L (ref 0–44)
AST: 32 U/L (ref 15–41)
Albumin: 3.9 g/dL (ref 3.5–5.0)
Alkaline Phosphatase: 82 U/L (ref 38–126)
Anion gap: 12 (ref 5–15)
BUN: 14 mg/dL (ref 8–23)
CO2: 27 mmol/L (ref 22–32)
Calcium: 9.6 mg/dL (ref 8.9–10.3)
Chloride: 93 mmol/L — ABNORMAL LOW (ref 98–111)
Creatinine, Ser: 0.95 mg/dL (ref 0.44–1.00)
GFR calc Af Amer: >60 mL/min (ref >60)
GFR calc non Af Amer: >60 mL/min (ref >60)
Glucose, Bld: 111 mg/dL — ABNORMAL HIGH (ref 70–99)
Potassium: 3.5 mmol/L (ref 3.5–5.1)
Sodium: 132 mmol/L — ABNORMAL LOW (ref 135–145)
Total Bilirubin: 1.5 mg/dL — ABNORMAL HIGH (ref 0.3–1.2)
Total Protein: 7.5 g/dL (ref 6.5–8.1)

## 2018-12-07 LAB — CBC WITH DIFFERENTIAL/PLATELET
Abs Immature Granulocytes: 0.06 10*3/uL (ref 0.00–0.07)
Basophils Absolute: 0 10*3/uL (ref 0.0–0.1)
Basophils Relative: 0 %
Eosinophils Absolute: 0 10*3/uL (ref 0.0–0.5)
Eosinophils Relative: 0 %
HCT: 42.4 % (ref 36.0–46.0)
Hemoglobin: 13.8 g/dL (ref 12.0–15.0)
Immature Granulocytes: 0 %
Lymphocytes Relative: 9 %
Lymphs Abs: 1.3 10*3/uL (ref 0.7–4.0)
MCH: 27.5 pg (ref 26.0–34.0)
MCHC: 32.5 g/dL (ref 30.0–36.0)
MCV: 84.5 fL (ref 80.0–100.0)
Monocytes Absolute: 1.2 10*3/uL — ABNORMAL HIGH (ref 0.1–1.0)
Monocytes Relative: 8 %
Neutro Abs: 11.8 10*3/uL — ABNORMAL HIGH (ref 1.7–7.7)
Neutrophils Relative %: 83 %
Platelets: 272 10*3/uL (ref 150–400)
RBC: 5.02 MIL/uL (ref 3.87–5.11)
RDW: 13.6 % (ref 11.5–15.5)
WBC: 14.4 10*3/uL — ABNORMAL HIGH (ref 4.0–10.5)
nRBC: 0 % (ref 0.0–0.2)

## 2018-12-07 LAB — URINALYSIS, ROUTINE W REFLEX MICROSCOPIC
Bilirubin Urine: NEGATIVE
Glucose, UA: NEGATIVE mg/dL
Hgb urine dipstick: NEGATIVE
Ketones, ur: NEGATIVE mg/dL
Nitrite: NEGATIVE
Protein, ur: NEGATIVE mg/dL
Specific Gravity, Urine: <1.005 — ABNORMAL LOW (ref 1.005–1.030)
pH: 6.5 (ref 5.0–8.0)

## 2018-12-07 LAB — SARS CORONAVIRUS 2 BY RT PCR (HOSPITAL ORDER, PERFORMED IN ~~LOC~~ HOSPITAL LAB): SARS Coronavirus 2: NEGATIVE

## 2018-12-07 LAB — SURGICAL PCR SCREEN
MRSA, PCR: NEGATIVE
Staphylococcus aureus: NEGATIVE

## 2018-12-07 LAB — URINALYSIS, MICROSCOPIC (REFLEX): RBC / HPF: NONE SEEN RBC/hpf (ref 0–5)

## 2018-12-07 LAB — LIPASE, BLOOD: Lipase: 27 U/L (ref 11–51)

## 2018-12-07 MED ORDER — FENTANYL CITRATE (PF) 100 MCG/2ML IJ SOLN
100.0000 ug | Freq: Once | INTRAMUSCULAR | Status: AC
  Administered 2018-12-07: 13:00:00 100 ug via INTRAVENOUS
  Filled 2018-12-07: qty 2

## 2018-12-07 MED ORDER — LACTATED RINGERS IV SOLN
INTRAVENOUS | Status: DC
  Administered 2018-12-07: 14:00:00 via INTRAVENOUS

## 2018-12-07 MED ORDER — SODIUM CHLORIDE 0.9 % IV SOLN
INTRAVENOUS | Status: DC
  Administered 2018-12-07: 09:00:00 via INTRAVENOUS

## 2018-12-07 MED ORDER — FENTANYL CITRATE (PF) 100 MCG/2ML IJ SOLN
100.0000 ug | Freq: Once | INTRAMUSCULAR | Status: AC
  Administered 2018-12-07: 100 ug via INTRAVENOUS
  Filled 2018-12-07: qty 2

## 2018-12-07 MED ORDER — PIPERACILLIN-TAZOBACTAM 3.375 G IVPB
3.3750 g | Freq: Three times a day (TID) | INTRAVENOUS | Status: DC
  Administered 2018-12-07 – 2018-12-08 (×3): 3.375 g via INTRAVENOUS
  Filled 2018-12-07 (×3): qty 50

## 2018-12-07 MED ORDER — MORPHINE SULFATE (PF) 2 MG/ML IV SOLN
1.0000 mg | INTRAVENOUS | Status: DC | PRN
  Administered 2018-12-07: 3 mg via INTRAVENOUS
  Filled 2018-12-07: qty 2

## 2018-12-07 MED ORDER — HYDROCODONE-ACETAMINOPHEN 5-325 MG PO TABS
1.0000 | ORAL_TABLET | ORAL | Status: DC | PRN
  Administered 2018-12-08: 2 via ORAL
  Filled 2018-12-07: qty 2

## 2018-12-07 MED ORDER — TRAMADOL HCL 50 MG PO TABS: 50.0000 mg | ORAL_TABLET | Freq: Four times a day (QID) | ORAL | Status: DC | PRN

## 2018-12-07 MED ORDER — IOHEXOL 300 MG/ML  SOLN
100.0000 mL | Freq: Once | INTRAMUSCULAR | Status: AC | PRN
  Administered 2018-12-07: 10:00:00 100 mL via INTRAVENOUS

## 2018-12-07 MED ORDER — LEVOTHYROXINE SODIUM 100 MCG PO TABS
100.0000 ug | ORAL_TABLET | Freq: Every day | ORAL | Status: DC
  Administered 2018-12-09: 06:00:00 100 ug via ORAL
  Filled 2018-12-07: qty 1

## 2018-12-07 MED ORDER — ONDANSETRON HCL 4 MG/2ML IJ SOLN
4.0000 mg | Freq: Once | INTRAMUSCULAR | Status: AC
  Administered 2018-12-07: 09:00:00 4 mg via INTRAVENOUS
  Filled 2018-12-07: qty 2

## 2018-12-07 MED ORDER — KCL IN DEXTROSE-NACL 20-5-0.45 MEQ/L-%-% IV SOLN
INTRAVENOUS | Status: DC
  Administered 2018-12-07: 20:00:00 via INTRAVENOUS
  Filled 2018-12-07: qty 1000

## 2018-12-07 NOTE — ED Provider Notes (Addendum)
[facility] EMERGENCY DEPARTMENT Provider Note   CSN: [id number] Arrival date & time: 12/07/18  0802     History   Chief Complaint Chief Complaint  Patient presents with  . Abdominal Pain    HPI Deborah Petersen is a 68 y.o. female.     The history is provided by the patient.  Abdominal Pain Pain location:  RLQ, RUQ and R flank Pain quality: sharp   Pain radiates to:  R flank Pain severity:  Severe Onset quality:  Gradual Duration:  3 days Timing:  Constant Progression:  Unchanged Chronicity:  New Context: retching   Context: not medication withdrawal and not previous surgeries   Relieved by:  Nothing Worsened by:  Nothing Ineffective treatments: peptobismol. Associated symptoms: anorexia and vomiting   Associated symptoms: no belching, no chest pain, no cough, no diarrhea, no dysuria, no fatigue, no fever, no hematemesis, no hematochezia, no hematuria and no shortness of breath   Risk factors: multiple surgeries   Patient with gastric bypass in the year 2000 presents with abdominal pain n/v since Friday.  Went to urgent care and was seen and sent to ER but wait was too long.  No f/c/r.  No hematuria.  No dysuria.  No diarrhea.    Past Medical History:  Diagnosis Date  . Anemia   . Brain tumor ([place]) 2005  . Dental crowns present   . Hypothyroidism   . Macromastia 10/2011  . Rheumatoid arthritis(714.0)    no current problems or meds.  . Vitamin D deficiency     Patient Active Problem List   Diagnosis Date Noted  . Febrile illness 07/20/2018  . History of abdominal hernia W MESH REPAIR 2003 04/17/2018  . Itching with irritation- abdominal wall overlaying mesh 04/17/2018  . Hypertension 12/17/2017  . Chronic fatigue 12/17/2017  . Status post R bunionectomy- 4/19 12/17/2017  . Laryngopharyngeal reflux (LPR) 08/22/2017  . Acute pharyngitis 08/22/2017  . h/o Elevated LDL cholesterol level 07/25/2017  . Cervical pain (neck) 02/12/2017  .  Hypothyroidism 10/03/2016  . Cyst of meniscus of left knee- s/p sx Dr Tonita Cong- [facility] 10/03/2016  . S/P hysterectomy- no cervix 10/03/2016  . History of non anemic vitamin B12 deficiency 10/03/2016  . BMI 35.0-35.9,adult 10/03/2016  . h/o RA (rheumatoid arthritis) - cured by faith and prayer   . Dental crowns present   . 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 Vitamin D deficiency   . Fe Def Anemia   . Macromastia 10/29/2011    Past Surgical History:  Procedure Laterality Date  . ABDOMINAL HYSTERECTOMY     partial  . ANTERIOR AND POSTERIOR REPAIR  2001  . APPENDECTOMY    . BRAIN MENINGIOMA EXCISION  2004  . BREAST REDUCTION SURGERY  11/19/2011   Procedure: MAMMARY REDUCTION  (BREAST);  Surgeon: Cristine Polio, MD;  Location: [facility];  Service: Plastics;  Laterality: Bilateral;  . FOOT SURGERY    . HERNIA REPAIR    . KNEE ARTHROSCOPY Left 06/09/2015   Procedure: LEFT KNEE ARTHROSCOPY WITH PARTIAL MEDIAL AND LATERAL MENISECTOMY AND DEBRIDEMENT;  Surgeon: Susa Day, MD;  Location: [facility];  Service: Orthopedics;  Laterality: Left;  . KNEE SURGERY    . TONSILLECTOMY       OB History    Gravida  3   Para  3   Term  3   Preterm      AB      Living  3     SAB      TAB      Ectopic      Multiple      Live Births               Home Medications    Prior to Admission medications   Medication Sig Start Date End Date Taking? Authorizing Provider  aspirin EC 81 MG tablet Take 4 tablets (325 mg total) by mouth every morning. Patient taking differently: Take 81 mg by mouth every morning.  06/09/15   Susa Day, MD  chlorthalidone (HYGROTON) 25 MG tablet Take 1 tablet (25 mg total) by mouth every morning. 12/17/17   Opalski, Neoma Laming, DO  Cholecalciferol (VITAMIN D3) 5000 units TABS 5,000 IU OTC vitamin D3 daily. Patient taking differently: Take 5,000 Units by mouth daily.  12/17/17   Opalski, Deborah, DO  COD  LIVER OIL PO Take 1 capsule by mouth every morning.     [provider]  Iron-FA-B Cmp-C-Biot-Probiotic (FUSION PLUS) CAPS Take 1 capsule by mouth daily. 12/17/17   Opalski, Neoma Laming, DO  levothyroxine (SYNTHROID, LEVOTHROID) 100 MCG tablet Take 1 tablet (100 mcg total) by mouth daily before breakfast. Patient needs office visit for further refills. 12/17/17   Opalski, Neoma Laming, DO  naproxen sodium (ANAPROX) 220 MG tablet Take 220 mg by mouth 2 (two) times daily as needed (pain.).    [provider]  Turmeric 500 MG TABS Take 1 tablet by mouth daily.     [provider]    Family History Family History  Family history unknown: Yes    Social History Social History   Tobacco Use  . Smoking status: Former Research scientist (life sciences)  . Smokeless tobacco: Former Systems developer    Quit date: 04/30/1976  Substance Use Topics  . Alcohol use: No  . Drug use: No     Allergies   Erythromycin   Review of Systems Review of Systems  Constitutional: Negative for fatigue and fever.  HENT: Negative for congestion.   Eyes: Negative for visual disturbance.  Respiratory: Negative for cough and shortness of breath.   Cardiovascular: Negative for chest pain.  Gastrointestinal: Positive for abdominal pain, anorexia and vomiting. Negative for diarrhea, hematemesis and hematochezia.  Genitourinary: Negative for dysuria and hematuria.  Musculoskeletal: Negative for arthralgias.  Neurological: Negative for dizziness.  All other systems reviewed and are negative.    Physical Exam Updated Vital Signs BP 116/87 (BP Location: Right Arm)   Pulse 88   Temp 98.6 F (37 C) (Oral)   Resp 16   Ht 5\' 3"  (1.6 m)   Wt 79.4 kg   SpO2 97%   BMI 31.00 kg/m   Physical Exam Vitals signs and nursing note reviewed.  Constitutional:      General: She is not in acute distress. HENT:     Head: Normocephalic and atraumatic.     Nose: Nose normal.     Mouth/Throat:     Mouth: Mucous membranes are moist.      Pharynx: Oropharynx is clear.  Eyes:     Conjunctiva/sclera: Conjunctivae normal.     Pupils: Pupils are equal, round, and reactive to light.  Neck:     Musculoskeletal: Normal range of motion and neck supple.  Cardiovascular:     Rate and Rhythm: Normal rate and regular rhythm.     Pulses: Normal pulses.     Heart sounds: Normal heart sounds.  Pulmonary:     Effort: Pulmonary effort is normal.  Breath sounds: Normal breath sounds.  Abdominal:     General: Abdomen is flat. Bowel sounds are normal.     Palpations: Abdomen is soft.     Tenderness: There is abdominal tenderness. There is no rebound. Positive signs include Murphy's sign.  Musculoskeletal: Normal range of motion.  Skin:    General: Skin is warm and dry.     Capillary Refill: Capillary refill takes less than 2 seconds.  Neurological:     General: No focal deficit present.     Mental Status: She is alert and oriented to person, place, and time.  Psychiatric:        Mood and Affect: Mood normal.        Behavior: Behavior normal.      ED Treatments / Results  Labs (all labs ordered are listed, but only abnormal results are displayed) Results for orders placed or performed during the hospital encounter of 12/07/18  CBC with Differential/Platelet  Result Value Ref Range   WBC 14.4 (H) 4.0 - 10.5 K/uL   RBC 5.02 3.87 - 5.11 MIL/uL   Hemoglobin 13.8 12.0 - 15.0 g/dL   HCT 42.4 36.0 - 46.0 %   MCV 84.5 80.0 - 100.0 fL   MCH 27.5 26.0 - 34.0 pg   MCHC 32.5 30.0 - 36.0 g/dL   RDW 13.6 11.5 - 15.5 %   Platelets 272 150 - 400 K/uL   nRBC 0.0 0.0 - 0.2 %   Neutrophils Relative % 83 %   Neutro Abs 11.8 (H) 1.7 - 7.7 K/uL   Lymphocytes Relative 9 %   Lymphs Abs 1.3 0.7 - 4.0 K/uL   Monocytes Relative 8 %   Monocytes Absolute 1.2 (H) 0.1 - 1.0 K/uL   Eosinophils Relative 0 %   Eosinophils Absolute 0.0 0.0 - 0.5 K/uL   Basophils Relative 0 %   Basophils Absolute 0.0 0.0 - 0.1 K/uL   Immature Granulocytes 0 %    Abs Immature Granulocytes 0.06 0.00 - 0.07 K/uL  Comprehensive metabolic panel  Result Value Ref Range   Sodium 132 (L) 135 - 145 mmol/L   Potassium 3.5 3.5 - 5.1 mmol/L   Chloride 93 (L) 98 - 111 mmol/L   CO2 27 22 - 32 mmol/L   Glucose, Bld 111 (H) 70 - 99 mg/dL   BUN 14 8 - 23 mg/dL   Creatinine, Ser 0.95 0.44 - 1.00 mg/dL   Calcium 9.6 8.9 - 10.3 mg/dL   Total Protein 7.5 6.5 - 8.1 g/dL   Albumin 3.9 3.5 - 5.0 g/dL   AST 32 15 - 41 U/L   ALT 29 0 - 44 U/L   Alkaline Phosphatase 82 38 - 126 U/L   Total Bilirubin 1.5 (H) 0.3 - 1.2 mg/dL   GFR calc non Af Amer >60 >60 mL/min   GFR calc Af Amer >60 >60 mL/min   Anion gap 12 5 - 15  Lipase, blood  Result Value Ref Range   Lipase 27 11 - 51 U/L   Ct Abdomen Pelvis W Contrast  Result Date: 12/07/2018 CLINICAL DATA:  Right abdominal pain, nausea and vomiting for 3 days. History of gastric bypass surgery. Prior appendectomy and hysterectomy. EXAM: CT ABDOMEN AND PELVIS WITH CONTRAST TECHNIQUE: Multidetector CT imaging of the abdomen and pelvis was performed using the standard protocol following bolus administration of intravenous contrast. CONTRAST:  167mL OMNIPAQUE IOHEXOL 300 MG/ML  SOLN COMPARISON:  None. FINDINGS: Lower chest: Posterior left lower lobe 7 mm solid  pulmonary nodule (series 4/image 14). Coronary atherosclerosis. Hepatobiliary: Normal liver size. No liver mass. Distended gallbladder contains calcified layering gallstones and gallbladder sludge, largest gallstone 8 mm. Moderate diffuse gallbladder wall thickening with pericolonic fat haziness and trace pericolonic fluid. No biliary ductal dilatation. CBD diameter 5 mm. No radiopaque choledocholithiasis. Pancreas: Normal, with no mass or duct dilation. Spleen: Normal size. No mass. Adrenals/Urinary Tract: No discrete adrenal nodules. No hydronephrosis. No renal masses. 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 Normal bladder. Stomach/Bowel: Status  post Roux-en-Y gastric bypass surgery with intact appearing gastrojejunostomy and collapsed excluded distal stomach. Gastric pouch is collapsed with no definite gastric wall thickening. Small hiatal hernia involving the gastric pouch. Intact enteroenterostomy in the left abdomen. No unexpected small bowel dilatation. No small bowel wall thickening. Appendectomy. Normal large bowel with no diverticulosis, large bowel wall thickening or pericolonic fat stranding. Vascular/Lymphatic: Atherosclerotic nonaneurysmal abdominal aorta. Patent portal, splenic, hepatic and renal veins. No pathologically enlarged lymph nodes in the abdomen or pelvis. Reproductive: Status post hysterectomy, with no abnormal findings at the vaginal cuff. No adnexal mass. Other: No pneumoperitoneum, ascites or focal fluid collection. Musculoskeletal: No aggressive appearing focal osseous lesions. Marked degenerative disc disease at L2-3. IMPRESSION: 1. CT findings are compatible with acute calculous cholecystitis. 2. No biliary ductal dilatation. 3. No evidence of bowel obstruction or acute bowel inflammation. 4. Small hiatal hernia involving the gastric pouch. Otherwise uncomplicated postsurgical changes from Roux-en-Y gastric bypass surgery. 5. Left lung base 7 mm solid pulmonary nodule. Non-contrast chest CT at 6-12 months is recommended. If the nodule is stable at time of repeat CT, then future CT at 18-24 months (from today's scan) is considered optional for low-risk patients, but is recommended for high-risk patients. This recommendation follows the consensus statement: Guidelines for Management of Incidental Pulmonary Nodules Detected on CT Images:From the Fleischner Society 2017; published online before print (10.1148/radiol.8088110315). 6.  Aortic Atherosclerosis (ICD10-I70.0). Electronically Signed   By: Ilona Sorrel M.D.   On: 12/07/2018 11:02    Radiology No results found.  Procedures Procedures (including critical care time)   Medications Ordered in ED Medications  0.9 %  sodium chloride infusion ( Intravenous New Bag/Given 12/07/18 0836)  fentaNYL (SUBLIMAZE) injection 100 mcg (100 mcg Intravenous Given 12/07/18 0835)  ondansetron (ZOFRAN) injection 4 mg (4 mg Intravenous Given 12/07/18 0835)  iohexol (OMNIPAQUE) 300 MG/ML solution 100 mL (100 mLs Intravenous Contrast Given 12/07/18 0946)    Case d/w Dr. Lucia Gaskins send to [facility] ED.    Final Clinical Impressions(s) / ED Diagnoses     Will admit to acalculous cholecystitis.  Case d/w Dr. Hennie Duos, Langford Carias, MD 12/07/18 [place], Mabry Tift, MD 12/07/18 1234

## 2018-12-07 NOTE — ED Provider Notes (Signed)
Blood pressure 100/66, pulse 79, temperature 98.6 F (37 C), temperature source Oral, resp. rate 18, height 5\' 3"  (1.6 m), weight 79.4 kg, SpO2 100 %.  In short, Deborah Petersen is a 68 y.o. female with a chief complaint of Abdominal Pain .  Refer to the original H&P for additional details.  01:30 PM  Patient arrives to the emergency department from [place] emergency department with acute cholecystitis on CT.  Lab work shows a leukocytosis.  COVID negative.  Patient's pain is well controlled at this time.  I have paged general surgery to make them aware that the patient is here for evaluation. Will start LR infusion.     Margette Fast, MD 12/07/18 1336

## 2018-12-07 NOTE — ED Notes (Signed)
Pt attempted to urinate, but was unsuccessful at this time

## 2018-12-07 NOTE — ED Triage Notes (Signed)
Pt here for reeval of RLQ abdominal pain. Was at [facility] for same, but waited 5 ours and then left.

## 2018-12-07 NOTE — ED Notes (Signed)
Covid swab and Pts signature obtained for transfer

## 2018-12-07 NOTE — H&P (Signed)
Re:   Deborah Petersen DOB:   1951-02-01 MRN:   [id number]  Chief Complaint Abdominal pain  ASSESEMENT AND PLAN: 1.  Acute cholecystitis  I discussed with the patient the indications and risks of gall bladder surgery.  The primary risks of gall bladder surgery include, but are not limited to, bleeding, infection, common bile duct injury, and open surgery.  There is also the risk that the patient may have continued symptoms after surgery.  We discussed the typical post-operative recovery course. I tried to answer the patient's questions.  Dr. Ninfa [place] is our surgeon of the week and will probably proceed with lap chole on Monday, 8/10, depending on the schedule.  2.  History of gastric bypass  Open surgery in [place] - 2000 3.  HTN 4.  History of RA - on no meds at this time 5.  On thyroid replacement   Chief Complaint  Patient presents with  . Abdominal Pain   PHYSICIAN REQUESTING CONSULTATION: Dr. Veatrice Kells, [facility]  HISTORY OF PRESENT ILLNESS: Deborah Petersen is a 68 y.o. (DOB: 1951-01-08)  white female whose primary care physician is Opalski, Neoma Laming, DO   I spoke to her husband, Deborah Petersen, on the phone.   She is trying to go on a plant based diet.  She ate at [facility] on Friday, 8/7 (she had a sausage biscuit and pimento cheese something).  She developed upper abdominal pain soon after eating.  The pain has not let up and she presented to [facility].  She'd had no recent GI symptoms, no liver, pancreas, or colon disease.  She had a neg colonoscopy by Dr. Benson Norway in 2018.       She has a history of an open gastric bypass in 2000 in [place].    She's had a hysterectomy in 2002 for benign reasons.  She had an open abdominal wall hernia repair with mesh in 2004.  She had a appendectomy as a child.  Abdominal CT scan - 12/07/2018 -   1. CT findings are compatible with acute calculous cholecystitis.  2. No biliary ductal dilatation.  3. Small hiatal hernia  involving the gastric pouch. 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  4. Left lung base 7 mm solid pulmonary nodule. Non-contrast chest CT at 6-12 months is recommended. If the nodule is stable at time of repeat CT, then future CT at 18-24 months (from today's scan) is considered optional for low-risk patients, but is recommended for high-risk patients.  5.  Aortic Atherosclerosis (ICD10-I70.0).  Past Medical History:  Diagnosis Date  . Anemia   . Brain tumor ([place]) 2005  . Dental crowns present   . Hypothyroidism   . Macromastia 10/2011  . Rheumatoid arthritis(714.0)    no current problems or meds.  . Vitamin D deficiency       Past Surgical History:  Procedure Laterality Date  . ABDOMINAL HYSTERECTOMY     partial  . ANTERIOR AND POSTERIOR REPAIR  2001  . APPENDECTOMY    . BRAIN MENINGIOMA EXCISION  2004  . BREAST REDUCTION SURGERY  11/19/2011   Procedure: MAMMARY REDUCTION  (BREAST);  Surgeon: Cristine Polio, MD;  Location: [facility];  Service: Plastics;  Laterality: Bilateral;  . FOOT SURGERY    . HERNIA REPAIR    . KNEE ARTHROSCOPY Left 06/09/2015   Procedure: LEFT KNEE ARTHROSCOPY WITH PARTIAL MEDIAL AND LATERAL MENISECTOMY AND DEBRIDEMENT;  Surgeon: Susa Day, MD;  Location: [facility];  Service: Orthopedics;  Laterality: Left;  . KNEE SURGERY    . TONSILLECTOMY        Current Facility-Administered Medications  Medication Dose Route Frequency Provider Last Rate Last Dose  . 0.9 %  sodium chloride infusion   Intravenous Continuous Palumbo, April, MD 125 mL/hr at 12/07/18 9476     Current Outpatient Medications  Medication Sig Dispense Refill  . aspirin EC 81 MG tablet Take 4 tablets (325 mg total) by mouth every morning. (Patient taking differently: Take 81 mg by mouth every morning. ) 60 tablet 0  . chlorthalidone (HYGROTON) 25 MG tablet Take 1 tablet (25 mg total) by mouth every morning. 90 tablet 3  .  Cholecalciferol (VITAMIN D3) 5000 units TABS 5,000 IU OTC vitamin D3 daily. (Patient taking differently: Take 5,000 Units by mouth daily. ) 90 tablet 3  . COD LIVER OIL PO Take 1 capsule by mouth every morning.     . Iron-FA-B Cmp-C-Biot-Probiotic (FUSION PLUS) CAPS Take 1 capsule by mouth daily. 90 capsule 3  . levothyroxine (SYNTHROID, LEVOTHROID) 100 MCG tablet Take 1 tablet (100 mcg total) by mouth daily before breakfast. Patient needs office visit for further refills. 90 tablet 3  . naproxen sodium (ANAPROX) 220 MG tablet Take 220 mg by mouth 2 (two) times daily as needed (pain.).    . Turmeric 500 MG TABS Take 1 tablet by mouth daily.         Allergies  Allergen Reactions  . Erythromycin Nausea And Vomiting    REVIEW OF SYSTEMS: Skin:  No history of rash.  No history of abnormal moles. Infection:  No history of hepatitis or HIV.  No history of MRSA. Neurologic:  History of benign brain meningioma excision in 2005 by Dr. Toni Arthurs at [facility].  She's had no trouble since then.  As a child she was treated for seizures - but these stopped when she was 68 yo and she's had no trouble since. Cardiac:  HTN.  Though she said part of this was job related.  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 Pulmonary:  Does not smoke cigarettes.  No asthma or bronchitis.  No OSA/CPAP.  Endocrine:  No diabetes. She is on thyroid replacement Gastrointestinal:  See HPI Urologic:  No history of kidney stones.  No history of bladder infections. Musculoskeletal:  Left knee arthroscopy - 06/09/2015 - [facility] Hematologic:  No bleeding disorder.  No history of anemia.  Not anticoagulated. Psycho-social:  The patient is oriented.   The patient has no obvious psychologic or social impairment to understanding our conversation and plan.  SOCIAL and FAMILY HISTORY: Married. Husband Deborah Petersen -  She has 4 children:  66 yo daughter (actually her husband's child), 27 yo daughter,  21 yo daughter, and 66 yo son. 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  She lives in [place].  PHYSICAL EXAM: BP 100/66 (BP Location: Left Arm)   Pulse 79   Temp 98.6 F (37 C) (Oral)   Resp 18   Ht 5\' 3"  (1.6 m)   Wt 79.4 kg   SpO2 100%   BMI 31.00 kg/m   General: WN WF who is alert.  She does not look too sick, but her RUQ is hurting. Skin:  Inspection and palpation - no mass or rash. Eyes:  Conjunctiva and lids unremarkable.            Pupils are equal Ears, Nose, Mouth,  and Throat:  Ears and nose unremarkable            Lips and teeth are unremarable. Neck: Supple. No mass, trachea midline.  No thyroid mass. Lymph Nodes:  No supraclavicular, cervical, or inguinal nodes. Lungs: Normal respiratory effort.  Clear to auscultation and symmetric breath sounds. Heart:  Palpation of the heart is normal.            Auscultation: RRR. No murmur or rub.  Abdomen: She has right upper quadrant tenderness.     She has a long midline scar.  No hernia or mass. Rectal: Not done. Musculoskeletal:  Good muscle strength and ROM  in upper and lower extremities.  Neurologic:  Grossly intact to motor and sensory function. Psychiatric: Normal judgement and insight. Behavior is normal.            Oriented to time, person, place.   DATA REVIEWED, COUNSELING AND COORDINATION OF CARE: Epic notes reviewed. Counseling and coordination of care exceeded more than 50% of the time spent with patient. Total time spent with patient and charting: 45 minutes  Alphonsa Overall, MD,  [facility], [address]  [place], [place]    [place] Phone:  [phone] FAX:  [phone]

## 2018-12-07 NOTE — ED Notes (Signed)
Pt tolerated, clear liquids for dinner.

## 2018-12-08 ENCOUNTER — Encounter (HOSPITAL_COMMUNITY)
Admission: EM | Disposition: A | Discharge status: Home / Self Care | Payer: Self-pay | Source: Home / Self Care | Attending: Emergency Medicine

## 2018-12-08 ENCOUNTER — Observation Stay (HOSPITAL_COMMUNITY): Payer: Medicare Other | Admitting: Certified Registered"

## 2018-12-08 ENCOUNTER — Encounter (HOSPITAL_COMMUNITY): Payer: Self-pay | Admitting: *Deleted

## 2018-12-08 DIAGNOSIS — I10 Essential (primary) hypertension: Secondary | ICD-10-CM | POA: Diagnosis not present

## 2018-12-08 DIAGNOSIS — E039 Hypothyroidism, unspecified: Secondary | ICD-10-CM | POA: Diagnosis not present

## 2018-12-08 DIAGNOSIS — K8000 Calculus of gallbladder with acute cholecystitis without obstruction: Secondary | ICD-10-CM | POA: Diagnosis not present

## 2018-12-08 DIAGNOSIS — E559 Vitamin D deficiency, unspecified: Secondary | ICD-10-CM | POA: Diagnosis not present

## 2018-12-08 HISTORY — PX: CHOLECYSTECTOMY: SHX55

## 2018-12-08 LAB — COMPREHENSIVE METABOLIC PANEL
ALT: 23 U/L (ref 0–44)
AST: 21 U/L (ref 15–41)
Albumin: 3.1 g/dL — ABNORMAL LOW (ref 3.5–5.0)
Alkaline Phosphatase: 82 U/L (ref 38–126)
Anion gap: 8 (ref 5–15)
BUN: 11 mg/dL (ref 8–23)
CO2: 28 mmol/L (ref 22–32)
Calcium: 8.7 mg/dL — ABNORMAL LOW (ref 8.9–10.3)
Chloride: 96 mmol/L — ABNORMAL LOW (ref 98–111)
Creatinine, Ser: 0.93 mg/dL (ref 0.44–1.00)
GFR calc Af Amer: >60 mL/min (ref >60)
GFR calc non Af Amer: >60 mL/min (ref >60)
Glucose, Bld: 119 mg/dL — ABNORMAL HIGH (ref 70–99)
Potassium: 3.6 mmol/L (ref 3.5–5.1)
Sodium: 132 mmol/L — ABNORMAL LOW (ref 135–145)
Total Bilirubin: 1.2 mg/dL (ref 0.3–1.2)
Total Protein: 6.3 g/dL — ABNORMAL LOW (ref 6.5–8.1)

## 2018-12-08 LAB — CBC
HCT: 36.4 % (ref 36.0–46.0)
Hemoglobin: 11.5 g/dL — ABNORMAL LOW (ref 12.0–15.0)
MCH: 27.3 pg (ref 26.0–34.0)
MCHC: 31.6 g/dL (ref 30.0–36.0)
MCV: 86.5 fL (ref 80.0–100.0)
Platelets: 221 10*3/uL (ref 150–400)
RBC: 4.21 MIL/uL (ref 3.87–5.11)
RDW: 13.5 % (ref 11.5–15.5)
WBC: 9.7 10*3/uL (ref 4.0–10.5)
nRBC: 0 % (ref 0.0–0.2)

## 2018-12-08 SURGERY — LAPAROSCOPIC CHOLECYSTECTOMY WITH INTRAOPERATIVE CHOLANGIOGRAM
Anesthesia: General

## 2018-12-08 MED ORDER — SUCCINYLCHOLINE CHLORIDE 200 MG/10ML IV SOSY
PREFILLED_SYRINGE | INTRAVENOUS | Status: DC | PRN
  Administered 2018-12-08: 100 mg via INTRAVENOUS

## 2018-12-08 MED ORDER — ALUM & MAG HYDROXIDE-SIMETH 200-200-20 MG/5ML PO SUSP
30.0000 mL | ORAL | Status: DC | PRN
  Administered 2018-12-08: 30 mL via ORAL
  Filled 2018-12-08: qty 30

## 2018-12-08 MED ORDER — ROCURONIUM BROMIDE 10 MG/ML (PF) SYRINGE
PREFILLED_SYRINGE | INTRAVENOUS | Status: DC | PRN
  Administered 2018-12-08: 35 mg via INTRAVENOUS

## 2018-12-08 MED ORDER — PROPOFOL 10 MG/ML IV BOLUS
INTRAVENOUS | Status: AC
  Filled 2018-12-08: qty 20

## 2018-12-08 MED ORDER — MIDAZOLAM HCL 2 MG/2ML IJ SOLN
INTRAMUSCULAR | Status: AC
  Filled 2018-12-08: qty 2

## 2018-12-08 MED ORDER — BUPIVACAINE HCL (PF) 0.5 % IJ SOLN
INTRAMUSCULAR | Status: AC
  Filled 2018-12-08: qty 30

## 2018-12-08 MED ORDER — PHENYLEPHRINE 40 MCG/ML (10ML) SYRINGE FOR IV PUSH (FOR BLOOD PRESSURE SUPPORT)
PREFILLED_SYRINGE | INTRAVENOUS | Status: DC | PRN
  Administered 2018-12-08: 120 ug via INTRAVENOUS
  Administered 2018-12-08: 80 ug via INTRAVENOUS

## 2018-12-08 MED ORDER — BUPIVACAINE HCL (PF) 0.5 % IJ SOLN
INTRAMUSCULAR | Status: DC | PRN
  Administered 2018-12-08: 20 mL

## 2018-12-08 MED ORDER — FENTANYL CITRATE (PF) 250 MCG/5ML IJ SOLN
INTRAMUSCULAR | Status: AC
  Filled 2018-12-08: qty 5

## 2018-12-08 MED ORDER — ONDANSETRON HCL 4 MG/2ML IJ SOLN
INTRAMUSCULAR | Status: DC | PRN
  Administered 2018-12-08: 4 mg via INTRAVENOUS

## 2018-12-08 MED ORDER — EPHEDRINE SULFATE-NACL 50-0.9 MG/10ML-% IV SOSY
PREFILLED_SYRINGE | INTRAVENOUS | Status: DC | PRN
  Administered 2018-12-08: 10 mg via INTRAVENOUS

## 2018-12-08 MED ORDER — PROPOFOL 10 MG/ML IV BOLUS
INTRAVENOUS | Status: DC | PRN
  Administered 2018-12-08: 130 mg via INTRAVENOUS

## 2018-12-08 MED ORDER — MIDAZOLAM HCL 5 MG/5ML IJ SOLN
INTRAMUSCULAR | Status: DC | PRN
  Administered 2018-12-08: 2 mg via INTRAVENOUS

## 2018-12-08 MED ORDER — LIDOCAINE 2% (20 MG/ML) 5 ML SYRINGE
INTRAMUSCULAR | Status: DC | PRN
  Administered 2018-12-08: 100 mg via INTRAVENOUS

## 2018-12-08 MED ORDER — SUGAMMADEX SODIUM 200 MG/2ML IV SOLN
INTRAVENOUS | Status: DC | PRN
  Administered 2018-12-08: 200 mg via INTRAVENOUS

## 2018-12-08 MED ORDER — FENTANYL CITRATE (PF) 100 MCG/2ML IJ SOLN
INTRAMUSCULAR | Status: DC | PRN
  Administered 2018-12-08 (×4): 50 ug via INTRAVENOUS

## 2018-12-08 MED ORDER — DEXAMETHASONE SODIUM PHOSPHATE 10 MG/ML IJ SOLN
INTRAMUSCULAR | Status: DC | PRN
  Administered 2018-12-08: 10 mg via INTRAVENOUS

## 2018-12-08 MED ORDER — LACTATED RINGERS IV SOLN
INTRAVENOUS | Status: DC
  Administered 2018-12-08 (×3): via INTRAVENOUS

## 2018-12-08 SURGICAL SUPPLY — 32 items
ADH SKN CLS APL DERMABOND .7 (GAUZE/BANDAGES/DRESSINGS) ×1
APL PRP STRL LF DISP 70% ISPRP (MISCELLANEOUS) ×1
APPLIER CLIP 5 13 M/L LIGAMAX5 (MISCELLANEOUS) ×3
APR CLP MED LRG 5 ANG JAW (MISCELLANEOUS) ×1
BAG SPEC RTRVL LRG 6X4 10 (ENDOMECHANICALS) ×1
CABLE HIGH FREQUENCY MONO STRZ (ELECTRODE) ×3 IMPLANT
CHLORAPREP W/TINT 26 (MISCELLANEOUS) ×3 IMPLANT
CLIP APPLIE 5 13 M/L LIGAMAX5 (MISCELLANEOUS) ×1 IMPLANT
COVER MAYO STAND STRL (DRAPES) IMPLANT
COVER WAND RF STERILE (DRAPES) IMPLANT
DECANTER SPIKE VIAL GLASS SM (MISCELLANEOUS) ×3 IMPLANT
DERMABOND ADVANCED (GAUZE/BANDAGES/DRESSINGS) ×2
DERMABOND ADVANCED .7 DNX12 (GAUZE/BANDAGES/DRESSINGS) ×1 IMPLANT
DRAPE C-ARM 42X120 X-RAY (DRAPES) IMPLANT
ELECT REM PT RETURN 15FT ADLT (MISCELLANEOUS) ×3 IMPLANT
GLOVE SURG SIGNA 7.5 PF LTX (GLOVE) ×3 IMPLANT
GOWN STRL REUS W/TWL XL LVL3 (GOWN DISPOSABLE) ×6 IMPLANT
HEMOSTAT SURGICEL 4X8 (HEMOSTASIS) IMPLANT
KIT BASIN OR (CUSTOM PROCEDURE TRAY) ×3 IMPLANT
KIT TURNOVER KIT A (KITS) IMPLANT
POUCH SPECIMEN RETRIEVAL 10MM (ENDOMECHANICALS) ×3 IMPLANT
SCISSORS LAP 5X35 DISP (ENDOMECHANICALS) ×3 IMPLANT
SET CHOLANGIOGRAPH MIX (MISCELLANEOUS) IMPLANT
SET IRRIG TUBING LAPAROSCOPIC (IRRIGATION / IRRIGATOR) ×3 IMPLANT
SET TUBE SMOKE EVAC HIGH FLOW (TUBING) ×3 IMPLANT
SLEEVE XCEL OPT CAN 5 100 (ENDOMECHANICALS) ×6 IMPLANT
SUT MNCRL AB 4-0 PS2 18 (SUTURE) ×3 IMPLANT
TOWEL OR 17X26 10 PK STRL BLUE (TOWEL DISPOSABLE) ×3 IMPLANT
TOWEL OR NON WOVEN STRL DISP B (DISPOSABLE) ×3 IMPLANT
TRAY LAPAROSCOPIC (CUSTOM PROCEDURE TRAY) ×3 IMPLANT
TROCAR BLADELESS OPT 5 100 (ENDOMECHANICALS) ×3 IMPLANT
TROCAR XCEL BLUNT TIP 100MML (ENDOMECHANICALS) ×3 IMPLANT

## 2018-12-08 NOTE — Anesthesia Procedure Notes (Signed)
Procedure Name: Intubation Date/Time: 12/08/2018 10:28 AM Performed by: Silas Sacramento, CRNA Pre-anesthesia Checklist: Patient identified, Emergency Drugs available, Suction available and Patient being monitored Patient Re-evaluated:Patient Re-evaluated prior to induction Oxygen Delivery Method: Circle system utilized Preoxygenation: Pre-oxygenation with 100% oxygen Induction Type: IV induction and Rapid sequence Ventilation: Mask ventilation without difficulty Laryngoscope Size: Mac and 3 Grade View: Grade I Tube type: Oral Tube size: 7.0 mm Number of attempts: 1 Airway Equipment and Method: Stylet Placement Confirmation: ETT inserted through vocal cords under direct vision,  positive ETCO2 and breath sounds checked- equal and bilateral Secured at: 22 cm Tube secured with: Tape Dental Injury: Teeth and Oropharynx as per pre-operative assessment  Comments: Pt intubated by Herbie Saxon, EMT/Paramedic student. Assisted by Dayle Points, CRNA, Janeece Riggers, MD. Atraumatic intubation. VSS throughout.

## 2018-12-08 NOTE — Progress Notes (Signed)
Patient ID: Deborah Petersen, female   DOB: 03-10-1951, 68 y.o.   MRN: [id number]   Patient with acute cholecystitis with gallstones Plan lap chole today I discussed the procedure in detail.  We discussed the risks and benefits of a laparoscopic cholecystectomy and possible cholangiogram including, but not limited to bleeding, infection, injury to surrounding structures such as the intestine or liver, bile leak, retained gallstones, need to convert to an open procedure, prolonged diarrhea, blood clots such as  DVT, common bile duct injury, anesthesia risks, and possible need for additional procedures.  The likelihood of improvement in symptoms and return to the patient's normal status is good. We discussed the typical post-operative recovery course.

## 2018-12-08 NOTE — Anesthesia Preprocedure Evaluation (Addendum)
Anesthesia Evaluation  Patient identified by MRN, date of birth, ID band Patient awake    Reviewed: Allergy & Precautions, NPO status , Patient's Chart, lab work & pertinent test results, reviewed documented beta blocker date and time   Airway Mallampati: II  TM Distance: >3 FB Neck ROM: Full    Dental no notable dental hx. (+) Teeth Intact   Pulmonary former smoker,    Pulmonary exam normal breath sounds clear to auscultation       Cardiovascular hypertension, Normal cardiovascular exam Rhythm:Regular Rate:Normal     Neuro/Psych    GI/Hepatic GERD  ,  Endo/Other  Hypothyroidism   Renal/GU      Musculoskeletal  (+) Arthritis ,   Abdominal (+) + obese,   Bowel sounds: normal.  Peds  Hematology  (+) Blood dyscrasia, anemia ,   Anesthesia Other Findings   Reproductive/Obstetrics                            Anesthesia Physical Anesthesia Plan  ASA: II  Anesthesia Plan: General   Post-op Pain Management:    Induction: Intravenous and Rapid sequence  PONV Risk Score and Plan: 1 and Ondansetron, Dexamethasone and Midazolam  Airway Management Planned: Oral ETT and LMA  Additional Equipment:   Intra-op Plan:   Post-operative Plan: Extubation in OR  Informed Consent: I have reviewed the patients History and Physical, chart, labs and discussed the procedure including the risks, benefits and alternatives for the proposed anesthesia with the patient or authorized representative who has indicated his/her understanding and acceptance.     Dental advisory given  Plan Discussed with: Anesthesiologist, Surgeon and CRNA  Anesthesia Plan Comments:        Anesthesia Quick Evaluation

## 2018-12-08 NOTE — Op Note (Signed)

## 2018-12-08 NOTE — Anesthesia Postprocedure Evaluation (Signed)
Anesthesia Post Note  Patient: JAKIAH BIENAIME  Procedure(s) Performed: LAPAROSCOPIC CHOLECYSTECTOMY (N/A )     Patient location during evaluation: PACU Anesthesia Type: General Level of consciousness: awake and alert Pain management: pain level controlled Vital Signs Assessment: post-procedure vital signs reviewed and stable Respiratory status: spontaneous breathing, nonlabored ventilation, respiratory function stable and patient connected to nasal cannula oxygen Cardiovascular status: blood pressure returned to baseline and stable Postop Assessment: no apparent nausea or vomiting Anesthetic complications: no    Last Vitals:  Vitals:   12/08/18 1200 12/08/18 1319  BP: 139/78 133/77  Pulse: 81 75  Resp: 18 16  Temp: 36.6 C 36.6 C  SpO2: 100% 100%    Last Pain:  Vitals:   12/08/18 1319  TempSrc: Oral  PainSc:                  Leo Weyandt

## 2018-12-08 NOTE — Transfer of Care (Signed)
Immediate Anesthesia Transfer of Care Note  Patient: Deborah Petersen  Procedure(s) Performed: LAPAROSCOPIC CHOLECYSTECTOMY (N/A )  Patient Location: PACU  Anesthesia Type:General  Level of Consciousness: oriented, drowsy and responds to stimulation  Airway & Oxygen Therapy: Patient Spontanous Breathing and Patient connected to face mask oxygen  Post-op Assessment: Report given to RN and Post -op Vital signs reviewed and stable  Post vital signs: Reviewed and stable  Last Vitals:  Vitals Value Taken Time  BP 141/78 12/08/18 1120  Temp    Pulse 77 12/08/18 1124  Resp 16 12/08/18 1124  SpO2 100 % 12/08/18 1124  Vitals shown include unvalidated device data.  Last Pain:  Vitals:   12/08/18 0901  TempSrc: Oral  PainSc:       Patients Stated Pain Goal: 3 (69/62/95 2841)  Complications: No apparent anesthesia complications

## 2018-12-09 ENCOUNTER — Encounter (HOSPITAL_COMMUNITY): Payer: Self-pay | Admitting: Surgery

## 2018-12-09 LAB — HIV ANTIBODY (ROUTINE TESTING W REFLEX): HIV Screen 4th Generation wRfx: NONREACTIVE

## 2018-12-09 MED ORDER — ACETAMINOPHEN 500 MG PO TABS: 1000.0000 mg | ORAL_TABLET | Freq: Three times a day (TID) | ORAL | 0 refills | Status: DC | PRN

## 2018-12-09 MED ORDER — TRAMADOL HCL 50 MG PO TABS: 50.0000 mg | ORAL_TABLET | Freq: Four times a day (QID) | ORAL | 0 refills | Status: DC | PRN

## 2018-12-09 NOTE — Progress Notes (Signed)
Patient's IV was leaking and RN attempted twice to get an IV unsuccessful, IV team order was placed and patient refused IV because she says she is getting discharged today.

## 2018-12-09 NOTE — Discharge Summary (Signed)
Patient ID: SOFFIA DOSHIER [id number] Aug 21, 1950 68 y.o.  Admit date: 12/07/2018 Discharge date: 12/09/2018  Admitting Diagnosis: Acute cholecystitis History of gastric bypass  Discharge Diagnosis Patient Active Problem List   Diagnosis Date Noted  . Acute cholecystitis 12/07/2018  . Febrile illness 07/20/2018  . History of abdominal hernia W MESH REPAIR 2003 04/17/2018  . Itching with irritation- abdominal wall overlaying mesh 04/17/2018  . Hypertension 12/17/2017  . Chronic fatigue 12/17/2017  . Status post R bunionectomy- 4/19 12/17/2017  . Laryngopharyngeal reflux (LPR) 08/22/2017  . Acute pharyngitis 08/22/2017  . h/o Elevated LDL cholesterol level 07/25/2017  . Cervical pain (neck) 02/12/2017  . Hypothyroidism 10/03/2016  . Cyst of meniscus of left knee- s/p sx Dr Tonita Cong- [facility] 10/03/2016  . S/P hysterectomy- no cervix 10/03/2016  . History of non anemic vitamin B12 deficiency 10/03/2016  . BMI 35.0-35.9,adult 10/03/2016  . h/o RA (rheumatoid arthritis) - cured by faith and prayer   . Dental crowns present   . 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 Vitamin D deficiency   . Fe Def Anemia   . Macromastia 10/29/2011    Consultants None  H&P: Deborah Petersen is a 68 y.o. (DOB: [birth year])  white female whose primary care physician is Opalski, Neoma Laming, DO              I spoke to her husband, Deborah Petersen, on the phone.              She is trying to go on a plant based diet.  She ate at [facility] on Friday, 8/7 (she had a sausage biscuit and pimento cheese something).  She developed upper abdominal pain soon after eating.  The pain has not let up and she presented to [facility].             She'd had no recent GI symptoms, no liver, pancreas, or colon disease.  She had a neg colonoscopy by Dr. Benson Norway in 2018.             She has a history of an open gastric bypass in 2000 in [place].               She's had a hysterectomy in 2002  for benign reasons.  She had an open abdominal wall hernia repair with mesh in 2004.  She had a appendectomy as a child.  Abdominal CT scan - 12/07/2018 -              1. CT findings are compatible with acute calculous cholecystitis.             2. No biliary ductal dilatation.             3. Small hiatal hernia involving the gastric pouch. 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             4. Left lung base 7 mm solid pulmonary nodule. Non-contrast chest CT at 6-12 months is recommended. If the nodule is stable at time of repeat CT, then future CT at 18-24 months (from today's scan) is considered optional for low-risk patients, but is recommended for high-risk patients.             5. Aortic Atherosclerosis (ICD10-I70.0).  Procedures Dr. Ninfa Linden, Laparoscopic Cholecystectomy, 12/08/2018  Hospital Course:  The patient was admitted and underwent a laparoscopic cholecystectomy. The patient tolerated the procedure well.  On POD 1, the patient  was tolerating a regular diet, voiding well, mobilizing, and pain was controlled with oral pain medications.  The patient was stable for DC home at this time. We discussed that the patient should not take NSAIDs with her history of a gastric bypass. We discussed the findings on her CT of a pulmonary nodule and the importance of following up with her PCP. She states understanding. Follow up as below. Patient discharged home in good condition.   Physical Exam: Gen:  Alert, NAD, pleasant HEENT: EOM's intact, pupils equal and round Card:  Normal rate and effort normal Abd: Soft, ND, appropriately tender. +BS. Incisions with glue intact appears well and are without drainage, bleeding, or signs of infection Ext:  No edema Psych: A&Ox3  Skin: no rashes noted, warm and dry  Allergies as of 12/09/2018      Reactions   Fruit & Vegetable Daily [nutritional Supplements] Swelling   Allergic to all fresh fruit   Erythromycin  Nausea And Vomiting      Medication List    TAKE these medications   acetaminophen 500 MG tablet Commonly known as: TYLENOL Take 2 tablets (1,000 mg total) by mouth every 8 (eight) hours as needed. What changed:   when to take this  reasons to take this   aspirin EC 81 MG tablet Take 4 tablets (325 mg total) by mouth every morning. What changed: how much to take   chlorthalidone 25 MG tablet Commonly known as: HYGROTON Take 1 tablet (25 mg total) by mouth every morning.   COD LIVER OIL PO Take 1 capsule by mouth every morning.   Fusion Plus Caps Take 1 capsule by mouth daily.   levothyroxine 100 MCG tablet Commonly known as: SYNTHROID Take 1 tablet (100 mcg total) by mouth daily before breakfast. Patient needs office visit for further refills.   traMADol 50 MG tablet Commonly known as: ULTRAM Take 1 tablet (50 mg total) by mouth every 6 (six) hours as needed.   Turmeric 500 MG Tabs Take 1 tablet by mouth daily.   Dialyvite Vitamin D 5000 125 MCG (5000 UT) capsule Generic drug: Cholecalciferol Take 5,000 Units by mouth daily.   Vitamin D3 125 MCG (5000 UT) Tabs 5,000 IU OTC vitamin D3 daily.        Follow-up Information    [facility] Follow up on 12/30/2018.   Specialty: General Surgery Why: 915am. Please arrive 30 minutes prior to your appointment for paperwork. Please bring a copy of your photo ID and insurance card to your appointment.  Contact information: [address] [place] [address] [phone]        Mellody Dance, DO. Call.   Specialty: Family Medicine Why: Please call to make an appointment to follow up on the pulmonary nodule that was seen on your CT scan.  Contact information: [address] [place] [phone]           Signed: Alferd Apa, [facility] 12/09/2018, 9:35 AM Pager: [phone]

## 2018-12-09 NOTE — Discharge Instructions (Signed)
Your CT scan showed a Left lung base 7 mm solid pulmonary nodule. The radiologist recommended that you get a non-contrast chest CT at 6-12 months is recommended. If the nodule is stable at time of repeat CT, then future CT at 18-24 months (from today's scan) is considered optional for low-risk patients, but is recommended for high-risk patients. Please call and schedule a follow up appointment with your PCP in regards to this.   June Park, P.A.  Please arrive at least 30 min before your appointment to complete your check in paperwork.  If you are unable to arrive 30 min prior to your appointment time we may have to cancel or reschedule you. LAPAROSCOPIC SURGERY: POST OP INSTRUCTIONS Always review your discharge instruction sheet given to you by the facility where your surgery was performed. IF YOU HAVE DISABILITY OR FAMILY LEAVE FORMS, YOU MUST BRING THEM TO THE OFFICE FOR PROCESSING.   DO NOT GIVE THEM TO YOUR DOCTOR.  PAIN CONTROL  1. First take acetaminophen (Tylenol) to control your pain after surgery.  Follow directions on package.  Taking acetaminophen (Tylenol)regularly after surgery will help to control your pain and lower the amount of prescription pain medication you may need.  You should not take more than 3,000 mg (4 grams) of acetaminophen (Tylenol) in 24 hours.  You should not take ibuprofen (Advil), aleve, motrin, naprosyn given your history of a gastric bypass.  2. A prescription for pain medication may be given to you upon discharge.  Take your pain medication as prescribed, if you still have uncontrolled pain after taking acetaminophen (Tylenol)  3. Use ice packs to help control pain. 4. If you need a refill on your pain medication, please contact your pharmacy.  They will contact our office to request authorization. Prescriptions will not be filled after 5pm or on week-ends.  HOME MEDICATIONS 5. Take your usually prescribed medications unless otherwise  directed.  DIET 6. You should follow a light diet the first few days after arrival home.  Be sure to include lots of fluids daily. Avoid fatty, fried foods.   CONSTIPATION 7. It is common to experience some constipation after surgery and if you are taking pain medication.  Increasing fluid intake and taking a stool softener (such as Colace) will usually help or prevent this problem from occurring.  A mild laxative (Milk of Magnesia or Miralax) should be taken according to package instructions if there are no bowel movements after 48 hours.  WOUND/INCISION CARE 8. Most patients will experience some swelling and bruising in the area of the incisions.  Ice packs will help.  Swelling and bruising can take several days to resolve.  9. Unless discharge instructions indicate otherwise, follow guidelines below  a. STERI-STRIPS - you may remove your outer bandages 48 hours after surgery, and you may shower at that time.  You have steri-strips (small skin tapes) in place directly over the incision.  These strips should be left on the skin for 7-10 days.   b. DERMABOND/SKIN GLUE - you may shower in 24 hours.  The glue will flake off over the next 2-3 weeks. 10. Any sutures or staples will be removed at the office during your follow-up visit.  ACTIVITIES 11. You may resume regular (light) daily activities beginning the next day--such as daily self-care, walking, climbing stairs--gradually increasing activities as tolerated.  You may have sexual intercourse when it is comfortable.  Refrain from any heavy lifting or straining until approved by your doctor. a.  You may drive when you are no longer taking prescription pain medication, you can comfortably wear a seatbelt, and you can safely maneuver your car and apply brakes.  FOLLOW-UP 12. You should see your doctor in the office for a follow-up appointment approximately 2-3 weeks after your surgery.  You should have been given your post-op/follow-up appointment  when your surgery was scheduled.  If you did not receive a post-op/follow-up appointment, make sure that you call for this appointment within a day or two after you arrive home to insure a convenient appointment time.   WHEN TO CALL YOUR DOCTOR: 1. Fever over 101.0 2. Inability to urinate 3. Continued bleeding from incision. 4. Increased pain, redness, or drainage from the incision. 5. Increasing abdominal pain  The clinic staff is available to answer your questions during regular business hours.  Please dont hesitate to call and ask to speak to one of the nurses for clinical concerns.  If you have a medical emergency, go to the nearest emergency room or call 911.  A surgeon from [facility] is always on call at the hospital. [address], [place], [place], [place]  [address] ? [address], [place], [place]   [phone] ? [phone] ? FAX [phone]   Gallbladder Eating Plan If you have a gallbladder condition, you may have trouble digesting fats. Eating a low-fat diet can help reduce your symptoms, and may be helpful before and after having surgery to remove your gallbladder (cholecystectomy). Your health care provider may recommend that you work with a diet and nutrition specialist (dietitian) to help you reduce the amount of fat in your diet. What are tips for following this plan? General guidelines  Limit your fat intake to less than 30% of your total daily calories. If you eat around 1,800 calories each day, this is less than 60 grams (g) of fat per day.  Fat is an important part of a healthy diet. Eating a low-fat diet can make it hard to maintain a healthy body weight. Ask your dietitian how much fat, calories, and other nutrients you need each day.  Eat small, frequent meals throughout the day instead of three large meals.  Drink at least 8-10 cups of fluid a day. Drink enough fluid to keep your urine clear or pale yellow.  Limit alcohol  intake to no more than 1 drink a day for nonpregnant women and 2 drinks a day for men. One drink equals 12 oz of beer, 5 oz of wine, or 1 oz of hard liquor. Reading food labels  Check Nutrition Facts on food labels for the amount of fat per serving. Choose foods with less than 3 grams of fat per serving. Shopping  Choose nonfat and low-fat healthy foods. Look for the words nonfat, low fat, or fat free.  Avoid buying processed or prepackaged foods. Cooking  Cook using low-fat methods, such as baking, broiling, grilling, or boiling.  Cook with small amounts of healthy fats, such as olive oil, grapeseed oil, canola oil, or sunflower oil. What foods are recommended?   All fresh, frozen, or canned fruits and vegetables.  Whole grains.  Low-fat or non-fat (skim) milk and yogurt.  Lean meat, skinless poultry, fish, eggs, and beans.  Low-fat protein supplement powders or drinks.  Spices and herbs. What foods are not recommended?  High-fat foods. These include baked goods, fast food, fatty cuts of meat, ice cream, french toast, sweet rolls, pizza, cheese bread, foods covered with butter,  creamy sauces, or cheese.  Fried foods. These include french fries, tempura, battered fish, breaded chicken, fried breads, and sweets.  Foods with strong odors.  Foods that cause bloating and gas. Summary  A low-fat diet can be helpful if you have a gallbladder condition, or before and after gallbladder surgery.  Limit your fat intake to less than 30% of your total daily calories. This is about 60 g of fat if you eat 1,800 calories each day.  Eat small, frequent meals throughout the day instead of three large meals. This information is not intended to replace advice given to you by your health care provider. Make sure you discuss any questions you have with your health care provider. Document Released: 04/21/2013 Document Revised: 08/07/2018 Document Reviewed: 05/24/2016 Elsevier Patient  Education  2020 [employer].

## 2018-12-10 ENCOUNTER — Other Ambulatory Visit: Payer: Self-pay | Admitting: Family Medicine

## 2018-12-10 ENCOUNTER — Other Ambulatory Visit: Payer: Self-pay

## 2018-12-10 DIAGNOSIS — E039 Hypothyroidism, unspecified: Secondary | ICD-10-CM

## 2018-12-10 DIAGNOSIS — I10 Essential (primary) hypertension: Secondary | ICD-10-CM

## 2018-12-10 MED ORDER — CHLORTHALIDONE 25 MG PO TABS: 25.0000 mg | ORAL_TABLET | Freq: Every morning | ORAL | 0 refills | Status: DC

## 2018-12-10 MED ORDER — LEVOTHYROXINE SODIUM 100 MCG PO TABS: 100.0000 ug | ORAL_TABLET | Freq: Every day | ORAL | 0 refills | Status: DC

## 2018-12-10 NOTE — Telephone Encounter (Signed)
Patient due for refills sent in 30 day supply Front staff will contact patient to set up appointment. MPulliam, CMA/RT(R)

## 2018-12-10 NOTE — Telephone Encounter (Signed)
Pharmacy sent refill request for Chlorthalidone.  Patient is due for follow up, sent in 30 days per office policy and front desk staff will contact patient to set up appointment. MPulliam, CMA/RT(R)

## 2018-12-23 ENCOUNTER — Encounter: Payer: Self-pay | Admitting: Family Medicine

## 2018-12-23 ENCOUNTER — Ambulatory Visit: Payer: Medicare Other | Admitting: Family Medicine

## 2018-12-23 ENCOUNTER — Other Ambulatory Visit: Payer: Self-pay

## 2018-12-23 VITALS — BP 123/84 | HR 75 | Ht 62.0 in | Wt 179.0 lb

## 2018-12-23 DIAGNOSIS — R911 Solitary pulmonary nodule: Secondary | ICD-10-CM | POA: Diagnosis not present

## 2018-12-23 DIAGNOSIS — Z09 Encounter for follow-up examination after completed treatment for conditions other than malignant neoplasm: Secondary | ICD-10-CM

## 2018-12-23 DIAGNOSIS — K810 Acute cholecystitis: Secondary | ICD-10-CM

## 2018-12-23 DIAGNOSIS — I10 Essential (primary) hypertension: Secondary | ICD-10-CM

## 2018-12-23 DIAGNOSIS — E039 Hypothyroidism, unspecified: Secondary | ICD-10-CM

## 2018-12-23 MED ORDER — CHLORTHALIDONE 25 MG PO TABS: 25.0000 mg | ORAL_TABLET | Freq: Every morning | ORAL | 1 refills | Status: DC

## 2018-12-23 MED ORDER — LEVOTHYROXINE SODIUM 100 MCG PO TABS: 100.0000 ug | ORAL_TABLET | Freq: Every day | ORAL | 1 refills | Status: DC

## 2018-12-23 NOTE — Progress Notes (Signed)
Impression and Recommendations:    1. Hospital discharge follow-up   2. s/p Acute cholecystitis- lap chole   3. Pulmonary nodule, left   4. Hypothyroidism, unspecified type   5. Hypertension, unspecified type     Hospital Discharge Follow-up - Acute Cholecystitis, Left Pulmonary Nodule -Regarding pt's recent hospitalization and/or ED visit: reviewed in great detail recent hospitalization notes, clinical lab tests, tests in the radiology section of CPT, tests in the medicine testing of CPT, and obtained history from pt.  Moderate complexity  - Patient seen in hospital for acute cholecystitis, underwent laprascopic cholecystectomy. - CT was performed and incidentally discovered left lung base nodule of 7 mm. - Will order chest CT non-contrast in 6-12 months. - Extensive education provided to patient today regarding need for screening.  - Patient will continue to obtain specialty care after her hospitalization as scheduled. - Patient understands importance of following up with specialists as established.  - Continue to obtain chronic care and lab work as scheduled.  Hypothyroidism, Unspecified - Recent labs acknowledged and reviewed with patient today. - Rx discussed with patient and filled.  See med list. - Continue treatment as established. - Will continue to monitor.  Hypertension, Unspecified - Controlled & stable at this time. - Continue treatment as established. - Will continue to monitor.   Orders Placed This Encounter  Procedures  . CT Chest Wo Contrast    Meds ordered this encounter  Medications  . chlorthalidone (HYGROTON) 25 MG tablet    Sig: Take 1 tablet (25 mg total) by mouth every morning. Needs office visit for further refills.    Dispense:  90 tablet    Refill:  1  . levothyroxine (SYNTHROID) 100 MCG tablet    Sig: Take 1 tablet (100 mcg total) by mouth daily before breakfast. Patient needs office visit for further refills.    Dispense:  90  tablet    Refill:  1    Medications Discontinued During This Encounter  Medication Reason  . traMADol (ULTRAM) 50 MG tablet Patient Preference  . chlorthalidone (HYGROTON) 25 MG tablet Reorder  . levothyroxine (SYNTHROID) 100 MCG tablet Reorder     Gross side effects, risk and benefits, and alternatives of medications and treatment plan in general discussed with patient.  Patient is aware that all medications have potential side effects and we are unable to predict every side effect or drug-drug interaction that may occur.   Patient will call with any questions prior to using medication if they have concerns.    Expresses verbal understanding and consents to current therapy and treatment regimen.  No barriers to understanding were identified.  Red flag symptoms and signs discussed in detail.  Patient expressed understanding regarding what to do in case of emergency\urgent symptoms  Please see AVS handed out to patient at the end of our visit for further patient instructions/ counseling done pertaining to today's office visit.   Return for Follow-up in mid October for welcome to Medicare and full set of fasting labs..     Note:  This note was prepared with assistance of Dragon voice recognition software. Occasional wrong-word or sound-a-like substitutions may have occurred due to the inherent limitations of voice recognition software.  This document serves as a record of services personally performed by Mellody Dance, DO. It was created on her behalf by Toni Amend, a trained medical scribe. The creation of this record is based on the scribe's personal observations and the provider's statements to them.  I have reviewed the above medical documentation for accuracy and completeness and I concur.  Mellody Dance, DO 12/23/2018 2:21 PM        --------------------------------------------------------------------------------------------------------------------------------------------------------------------------------------------------------------------------------------------    Subjective:     HPI: Deborah Petersen is a 68 y.o. female who presents to [place] at [facility] today for issues as discussed below.  Hospital Follow-Up - Acute Cholecystitis Had abdominal pain a little over two weeks ago, and went to [facility].  Says they sent her to the hospital and this experience was a "nightmare."  States she was there for hours and hours, "with the million people in there."  Was eventually taken to [facility] via ambulance.  Confirms eating okay and drinking okay since hospitalization.  Says "I can do anything I want now."  Denies unusual food intolerances since cholecystectomy.  Patient has a history of gastric bypass surgery and confirms she understands how to eat prudently.  Has her post-op check this upcoming Monday with surgeon.  Smoking History & Lung Health Patient smoked for maybe 4-5 years "because I was trying to be grown when I was a kid."  When she started having children of her own, she quit smoking.  Thinks she quit in 1978.  States she was not exposed to significant secondhand smoke.  Has never been told she has anything in her lungs in the past.  Notes she gets pneumonia frequently, got the vaccine for it, and has been placed on methotrexate.  Says she wasn't really worried about the nodule in her lung.  Denies SOB, coughing up blood.  Denies more SOB with activity than normal.   Wt Readings from Last 3 Encounters:  12/23/18 179 lb (81.2 kg)  12/07/18 175 lb (79.4 kg)  07/20/18 170 lb (77.1 kg)   BP Readings from Last 3 Encounters:  12/23/18 123/84  12/09/18 115/72  12/05/18 134/90   Pulse Readings from Last 3 Encounters:  12/23/18 75  12/09/18 73  12/05/18 74   BMI  Readings from Last 3 Encounters:  12/23/18 32.74 kg/m  12/07/18 31.00 kg/m  07/20/18 31.09 kg/m     Patient Care Team    Relationship Specialty Notifications Start End  Mellody Dance, DO PCP - General Family Medicine  10/03/16   Carol Ada, MD Consulting Physician Gastroenterology  10/03/16   Susa Day, MD Consulting Physician Orthopedic Surgery  10/03/16   Landis Martins, [place] Physician Podiatry  12/17/17      Patient Active Problem List   Diagnosis Date Noted  . Hypertension 12/17/2017    Priority: High  . h/o Elevated LDL cholesterol level 07/25/2017    Priority: High  . Hypothyroidism 10/03/2016    Priority: High  . BMI 35.0-35.9,adult 10/03/2016    Priority: High  . Chronic fatigue 12/17/2017    Priority: Medium  . 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 Vitamin D deficiency     Priority: Medium  . Fe Def Anemia     Priority: Medium  . Status post R bunionectomy- 4/19 12/17/2017    Priority: Low  . Cyst of meniscus of left knee- s/p sx Dr Tonita Cong- [facility] 10/03/2016    Priority: Low  . S/P hysterectomy- no cervix 10/03/2016    Priority: Low  . History of non anemic vitamin B12 deficiency 10/03/2016    Priority: Low  . h/o RA (rheumatoid arthritis) - cured by faith and prayer  Priority: Low  . Acute cholecystitis 12/07/2018  . Febrile illness 07/20/2018  . History of abdominal hernia W MESH REPAIR 2003 04/17/2018  . Itching with irritation- abdominal wall overlaying mesh 04/17/2018  . Laryngopharyngeal reflux (LPR) 08/22/2017  . Acute pharyngitis 08/22/2017  . Cervical pain (neck) 02/12/2017  . Dental crowns present   . Macromastia 10/29/2011    Past Medical history, Surgical history, Family history, Social history, Allergies and Medications have been entered into the medical record, reviewed and changed as needed.    Current Meds  Medication Sig  . acetaminophen (TYLENOL) 500 MG  tablet Take 2 tablets (1,000 mg total) by mouth every 8 (eight) hours as needed.  Marland Kitchen aspirin EC 81 MG tablet Take 4 tablets (325 mg total) by mouth every morning. (Patient taking differently: Take 81 mg by mouth every morning. )  . chlorthalidone (HYGROTON) 25 MG tablet Take 1 tablet (25 mg total) by mouth every morning. Needs office visit for further refills.  . Cholecalciferol (DIALYVITE VITAMIN D 5000) 125 MCG (5000 UT) capsule Take 5,000 Units by mouth daily.  . Cholecalciferol (VITAMIN D3) 5000 units TABS 5,000 IU OTC vitamin D3 daily.  . COD LIVER OIL PO Take 1 capsule by mouth every morning.   . Iron-FA-B Cmp-C-Biot-Probiotic (FUSION PLUS) CAPS Take 1 capsule by mouth daily.  Marland Kitchen levothyroxine (SYNTHROID) 100 MCG tablet Take 1 tablet (100 mcg total) by mouth daily before breakfast. Patient needs office visit for further refills.  . Turmeric 500 MG TABS Take 1 tablet by mouth daily.   . [DISCONTINUED] chlorthalidone (HYGROTON) 25 MG tablet Take 1 tablet (25 mg total) by mouth every morning. Needs office visit for further refills.  . [DISCONTINUED] levothyroxine (SYNTHROID) 100 MCG tablet Take 1 tablet (100 mcg total) by mouth daily before breakfast. Patient needs office visit for further refills.    Allergies:  Allergies  Allergen Reactions  . Fruit & Vegetable Daily [Nutritional Supplements] Swelling    Allergic to all fresh fruit  . Erythromycin Nausea And Vomiting     Review of Systems:  A fourteen system review of systems was performed and found to be positive as per HPI.   Objective:   Blood pressure 123/84, pulse 75, height 5\' 2"  (1.575 m), weight 179 lb (81.2 kg), SpO2 98 %. Body mass index is 32.74 kg/m. General:  Well Developed, well nourished, appropriate for stated age.  Neuro:  Alert and oriented,  extra-ocular muscles intact  HEENT:  Normocephalic, atraumatic, neck supple, no carotid bruits appreciated  Skin:  no gross rash, warm, pink. Cardiac:  RRR, S1 S2  Respiratory:  ECTA B/L and A/P, Not using accessory muscles, speaking in full sentences- unlabored. Vascular:  Ext warm, no cyanosis apprec.; cap RF less 2 sec. Psych:  No HI/SI, judgement and insight good, Euthymic mood. Full Affect.

## 2018-12-23 NOTE — Patient Instructions (Signed)
Pulmonary Nodule A pulmonary nodule is a small, round growth of tissue in the lung. It is sometimes referred to as a "shadow" or "spot on the lung." Nodules range in size from less than 1/5 of an inch (4 mm) to a little bigger than an inch (30 mm). Pulmonary nodules can be either noncancerous (benign) or cancerous (malignant). Most are noncancerous. Smaller nodules in people who do not smoke and do not have any other risk factors for lung cancer are more likely to be noncancerous. Larger, irregular nodules in people who smoke or who have a strong family history of lung cancer are more likely to be cancerous. What are the causes? This condition may be caused by:  A bacterial, fungal, or viral infection, such as tuberculosis. The infection is usually an old and inactive one.  A noncancerous mass of tissue.  Inflammation from conditions such as rheumatoid arthritis.  Abnormal blood vessels in the lungs.  Cancerous tissue, such as lung cancer or a cancer in another part of the body that has spread to the lung. What are the signs or symptoms? This condition usually does not cause symptoms. If symptoms appear, they are usually related to the underlying cause. For example, if the condition is caused by an infection, you may have a cough or fever. How is this diagnosed? This condition is usually diagnosed with an X-ray or CT scan. To help determine whether a pulmonary nodule is benign or malignant, your health care provider will:  Take your medical history.  Perform a physical exam.  Order tests, including: ? Blood tests. ? A skin test called a tuberculin test. This test is done to check if you have been exposed to the germ that causes tuberculosis. ? Chest X-rays. ? A CT scan. This test shows smaller pulmonary nodules more clearly and with more detail than an X-ray. ? A positron emission tomography (PET) scan. This test is done to check if the nodule is cancerous. During the test, a safe amount  of a radioactive substance is injected into the bloodstream. Then a picture is taken. ? Biopsy. In this test, a tiny piece of the pulmonary nodule is removed and then examined under a microscope. How is this treated? Treatment for this condition depends on whether the pulmonary nodule is malignant or benign as well as your risk of getting cancer.  Noncancerous nodules usually do not need to be treated, but they may need to be monitored with CT scans. If a CT scan shows that the pulmonary nodule got bigger, more tests may be done.  Some nodules need to be removed. If this is the case, you may have a procedure called a thoractomy. During the procedure, your health care provider will make an incision in your chest and remove the part of the lung where the nodule is located. Follow these instructions at home:   Take over-the-counter and prescription medicines only as told by your health care provider.  Do not use any products that contain nicotine or tobacco, such as cigarettes and e-cigarettes. If you need help quitting, ask your health care provider.  Keep all follow-up visits as told by your health care provider. This is important. Contact a health care provider if:  You have trouble breathing when you are active.  You feel sick or unusually tired.  You do not feel like eating.  You lose weight without trying.  You develop chills or night sweats. Get help right away if:  You cannot catch your breath.    You begin wheezing.  You cannot stop coughing.  You cough up blood.  You become dizzy or feel like you are going to faint.  You have sudden chest pain.  You have a fever or persistent symptoms for more than 2-3 days.  You have a fever and your symptoms suddenly get worse. Summary  A pulmonary nodule is a small, round growth of tissue in the lung. Most pulmonary nodules are noncancerous.  This condition is usually diagnosed with an X-ray or CT scan.  Common causes of  pulmonary nodules include infection, inflammation, and noncancerous growths.  Though less common, if a nodule is found to be cancerous, you will need specific diagnostic tests and treatment options as directed by your medical provider.  Treatment for this condition depends on whether the pulmonary nodule is benign or malignant as well as your risk of getting cancer. This information is not intended to replace advice given to you by your health care provider. Make sure you discuss any questions you have with your health care provider. Document Released: 02/11/2009 Document Revised: 05/10/2017 Document Reviewed: 05/15/2016 Elsevier Patient Education  2020 Elsevier Inc.  

## 2019-01-15 ENCOUNTER — Encounter: Payer: Medicare Other | Admitting: Family Medicine

## 2019-01-23 ENCOUNTER — Ambulatory Visit (INDEPENDENT_AMBULATORY_CARE_PROVIDER_SITE_OTHER): Payer: Medicare Other | Admitting: Family Medicine

## 2019-01-23 ENCOUNTER — Encounter: Payer: Self-pay | Admitting: Family Medicine

## 2019-01-23 ENCOUNTER — Other Ambulatory Visit: Payer: Self-pay

## 2019-01-23 VITALS — BP 127/85 | HR 75 | Temp 98.2°F | Resp 16 | Ht 63.0 in | Wt 187.0 lb

## 2019-01-23 DIAGNOSIS — Z13820 Encounter for screening for osteoporosis: Secondary | ICD-10-CM

## 2019-01-23 DIAGNOSIS — H9191 Unspecified hearing loss, right ear: Secondary | ICD-10-CM | POA: Diagnosis not present

## 2019-01-23 DIAGNOSIS — D496 Neoplasm of unspecified behavior of brain: Secondary | ICD-10-CM | POA: Diagnosis not present

## 2019-01-23 DIAGNOSIS — Z0000 Encounter for general adult medical examination without abnormal findings: Secondary | ICD-10-CM

## 2019-01-23 NOTE — Progress Notes (Signed)
Subjective:   CAROLYNNE ORBAN is a 68 y.o. female who presents for Medicare Annual (Subsequent) preventive examination.    Since your last mammogram was several years ago and you declined going for that due to the radiation, please let us know if you change your mind.   Last time it was done was 09/09/2013   Last colonoscopy was done 07/22/2015.  It was normal and you were told to repeat in 10 years.  Last labs were done in March 2019.  We will obtain full set today.  We will let you know via my chart what the results are and if needed, we will bring you in to discuss them if a change in treatment plan is warranted     Objective:     Vitals: There were no vitals taken for this visit.  There is no height or weight on file to calculate BMI.  Advanced Directives 12/07/2018 12/07/2018 10/03/2016 06/02/2015 11/12/2011  Does Patient Have a Medical Advance Directive? No No No No Patient does not have advance directive;Patient would not like information  Would patient like information on creating a medical advance directive? No - Patient declined - Yes (ED - Information included in AVS) No - patient declined information -    Tobacco Social History   Tobacco Use  Smoking Status Former Smoker  . Packs/day: 1.00  . Years: 4.00  . Pack years: 4.00  Smokeless Tobacco Former Systems developer  . Quit date: 04/30/1976     Counseling given: Not Answered     Past Medical History:  Diagnosis Date  . Anemia   . Brain tumor ([place]) 2005  . Dental crowns present   . Hypothyroidism   . Macromastia 10/2011  . Rheumatoid arthritis(714.0)    no current problems or meds.  . Vitamin D deficiency    Past Surgical History:  Procedure Laterality Date  . ABDOMINAL HYSTERECTOMY     partial  . ANTERIOR AND POSTERIOR REPAIR  2001  . APPENDECTOMY    . BRAIN MENINGIOMA EXCISION  2004  . BREAST REDUCTION SURGERY  11/19/2011   Procedure: MAMMARY REDUCTION  (BREAST);  Surgeon: Cristine Polio, MD;  Location: [place];  Service: Plastics;  Laterality: Bilateral;  . CHOLECYSTECTOMY N/A 12/08/2018   Procedure: LAPAROSCOPIC CHOLECYSTECTOMY;  Surgeon: Coralie Keens, MD;  Location: [facility];  Service: General;  Laterality: N/A;  . FOOT SURGERY    . HERNIA REPAIR    . KNEE ARTHROSCOPY Left 06/09/2015   Procedure: LEFT KNEE ARTHROSCOPY WITH PARTIAL MEDIAL AND LATERAL MENISECTOMY AND DEBRIDEMENT;  Surgeon: Susa Day, MD;  Location: [facility];  Service: Orthopedics;  Laterality: Left;  . KNEE SURGERY    . TONSILLECTOMY     Family History  Family history unknown: Yes   Social History   Socioeconomic History  . Marital status: Married    Spouse name: Not on file  . Number of children: Not on file  . Years of education: Not on file  . Highest education level: Not on file  Occupational History  . Not on file  Social Needs  . Financial resource strain: Not on file  . Food insecurity    Worry: Not on file    Inability: Not on file  . Transportation needs    Medical: Not on file    Non-medical: Not on file  Tobacco Use  . Smoking status: Former Smoker    Packs/day: 1.00    Years: 4.00    Pack years:  4.00  . Smokeless tobacco: Former Systems developer    Quit date: 04/30/1976  Substance and Sexual Activity  . Alcohol use: No  . Drug use: No  . Sexual activity: Never    Birth control/protection: Post-menopausal  Lifestyle  . Physical activity    Days per week: Not on file    Minutes per session: Not on file  . Stress: Not on file  Relationships  . Social Herbalist on phone: Not on file    Gets together: Not on file    Attends religious service: Not on file    Active member of club or organization: Not on file    Attends meetings of clubs or organizations: Not on file    Relationship status: Not on file  Other Topics Concern  . Not on file  Social History Narrative  . Not on file    Outpatient Encounter Medications as of 01/23/2019  Medication Sig  . acetaminophen (TYLENOL)  500 MG tablet Take 2 tablets (1,000 mg total) by mouth every 8 (eight) hours as needed.  Marland Kitchen aspirin EC 81 MG tablet Take 4 tablets (325 mg total) by mouth every morning. (Patient taking differently: Take 81 mg by mouth every morning. )  . chlorthalidone (HYGROTON) 25 MG tablet Take 1 tablet (25 mg total) by mouth every morning. Needs office visit for further refills.  . Cholecalciferol (DIALYVITE VITAMIN D 5000) 125 MCG (5000 UT) capsule Take 5,000 Units by mouth daily.  . Cholecalciferol (VITAMIN D3) 5000 units TABS 5,000 IU OTC vitamin D3 daily.  . COD LIVER OIL PO Take 1 capsule by mouth every morning.   . Iron-FA-B Cmp-C-Biot-Probiotic (FUSION PLUS) CAPS Take 1 capsule by mouth daily.  Marland Kitchen levothyroxine (SYNTHROID) 100 MCG tablet Take 1 tablet (100 mcg total) by mouth daily before breakfast. Patient needs office visit for further refills.  . Turmeric 500 MG TABS Take 1 tablet by mouth daily.    No facility-administered encounter medications on file as of 01/23/2019.     Activities of Daily Living In your present state of health, do you have any difficulty performing the following activities: 12/07/2018  Hearing? N  Vision? N  Difficulty concentrating or making decisions? N  Walking or climbing stairs? N  Dressing or bathing? N  Doing errands, shopping? N  Some recent data might be hidden    Patient Care Team: Mellody Dance, DO as PCP - General (Family Medicine) Carol Ada, MD as Consulting Physician (Gastroenterology) Susa Day, MD as Consulting Physician (Orthopedic Surgery) Landis Martins, DPM as Consulting Physician (Podiatry)    Assessment:   This is a routine wellness examination for [place].  Exercise Activities and Dietary recommendations    Goals   None     Fall Risk Fall Risk  12/17/2017 07/25/2017  Falls in the past year? No No    Is the patient's home free of loose throw rugs in walkways, pet beds, electrical cords, etc?   yes      Grab bars in the  bathroom? no      Handrails on the stairs?   yes      Adequate lighting?   yes  Timed Get Up and Go performed: [facility]  Depression Screen PHQ 2/9 Scores 12/23/2018 04/17/2018 12/17/2017 08/19/2017  PHQ - 2 Score 0 0 0 3  PHQ- 9 Score 0 0 2 9     Cognitive Function        Immunization History  Administered Date(s) Administered  . Influenza,inj,Quad  PF,6+ Mos 02/12/2017  . Tdap 02/12/2017  . Zoster Recombinat (Shingrix) 12/17/2017, 04/17/2018    Qualifies for Shingles Vaccine?- no already done   Screening Tests Health Maintenance  Topic Date Due  . INFLUENZA VACCINE  11/29/2018  . DEXA SCAN  04/18/2019 (Originally 12/31/2017)  . PNA vac Low Risk Adult (1 of 2 - PCV13) 04/18/2019 (Originally 12/31/2017)  . MAMMOGRAM  10/28/2025 (Originally 09/10/2015)  . COLONOSCOPY  07/21/2025  . TETANUS/TDAP  02/13/2027  . Hepatitis C Screening  Completed    Cancer Screenings: Lung: Low Dose CT Chest recommended if Age 39-80 years, 30 pack-year currently smoking OR have quit w/in 15years. Patient does not qualify. Breast:  Up to date on Mammogram? No, patient declines this.  Last done 2015. Up to date of Bone Density/Dexa? No, patient agrees to this today.  Referral sent. Colorectal: She is up-to-date.  Last done 2017.  Per the report by the colonoscopy is 10-year repeat.  Additional Screenings:  Hepatitis C Screening: He declines hep C and HIV screening.  States she has had it once in her lifetime and no new partners tattoos etc.  She declines these today.     Plan:   Orders Placed This Encounter  Procedures  . DG DXA FRACTURE ASSESSMENT  . Comprehensive metabolic panel  . Hemoglobin A1c  . CBC with Differential/Platelet  . Lipid panel  . T4, free  . TSH  . VITAMIN D 25 Hydroxy (Vit-D Deficiency, Fractures)  . Ambulatory referral to Audiology    I have personally reviewed and noted the following in the patient's chart:   . Medical and social history . Use of alcohol, tobacco  or illicit drugs  . Current medications and supplements . Functional ability and status . Nutritional status . Physical activity . Advanced directives . List of other physicians . Hospitalizations, surgeries, and ER visits in previous 12 months . Vitals . Screenings to include cognitive, depression, and falls . Referrals and appointments  In addition, I have reviewed and discussed with patient certain preventive protocols, quality metrics, and best practice recommendations. A written personalized care plan for preventive services as well as general preventive health recommendations were provided to patient.     Dionne Ano, CMA  01/23/2019

## 2019-01-23 NOTE — Patient Instructions (Addendum)
Since your last mammogram was several years ago and you declined going for that due to the radiation, please let us know if you change your mind.   Last time it was done was 09/09/2013   Last colonoscopy was done 07/22/2015.  It was normal and you were told to repeat in 10 years.  Last labs were done in March 2019.  We will obtain full set today.  We will let you know via my chart what the results are and if needed, we will bring you in to discuss them if a change in treatment plan is warranted     Top Ten Foods for Health  1. Water Drink at least 8 to 12 cups of water daily. Consume half of your body weight in pounds, is the amount of water in ounces to drink daily.  Ie: a 200lb person = 100 oz water daily  2. Dark Green Vegetables Eat dark green vegetables at least three to four times a week. Good options include broccoli, peppers, brussel sprouts and leafy greens like kale and spinach.  3. Whole Grains Whole grains should be included in your diet at least two to three times daily. Look for whole wheat flour, rye, oatmeal, barley, amaranth, quinoa or a multigrain. A good source of fiber includes 3 to 4 grams of fiber per serving. A great source has 5 or more grams of fiber per serving.  4. Beans and Lentils Try to eat a bean-based meal at least once a week. Try to add legumes, including beans and lentils, to soups, stews, casseroles, salads and dips or eat them plain.  5. Fish Try to eat two to three serving of fish a week. A serving consists of 3 to 4 ounces of cooked fish. Good choices are salmon, trout, herring, bluefish, sardines and tuna.  6. Berries Include two to four servings of fruit in your diet each day. Try to eat berries such as raspberries, blueberries, blackberries and strawberries.  7. Winter Squash Eat butternut and acorn squash as well as other richly pigmented dark orange and green colored vegetables like sweet potato, cantaloupe and mango.  8. Soy 25 grams of soy  protein a day is recommended as part of a low-fat diet to help lower cholesterol levels. Try tofu, soymilk, edamame soybeans, tempeh and texturized vegetable protein (TVP).  9. Flaxseed, Nuts and Seeds Add 1 to 2 tablespoons of ground flaxseed or other seeds to food each day or include a moderate amount of nuts - 1/4 cup - in your daily diet.  10. Organic Yogurt Men and women between 34 and 52 years of age need 1000 milligrams of calcium a day and 1200 milligrams if 43 or older. Eat calcium-rich foods such as nonfat or low-fat dairy products three to four times a day. Include organic choices.     Please realize, EXERCISE IS MEDICINE!  -  American Heart Association [facility]) guidelines for exercise : If you are in good health, without any medical conditions, you should engage in 150-300 minutes of moderate intensity aerobic activity per week.  This means you should be huffing and puffing throughout your workout.   Engaging in regular exercise will improve brain function and memory, as well as improve mood, boost immune system and help with weight management.  As well as the other, more well-known effects of exercise such as decreasing blood sugar levels, decreasing blood pressure,  and decreasing bad cholesterol levels/ increasing good cholesterol levels.     -  The AHA strongly endorses consumption of a diet that contains a variety of foods from all the food categories with an emphasis on fruits and vegetables; fat-free and low-fat dairy products; cereal and grain products; legumes and nuts; and fish, poultry, and/or extra lean meats.    Excessive food intake, especially of foods high in saturated and trans fats, sugar, and salt, should be avoided.    Adequate water intake of roughly 1/2 of your weight in pounds, should equal the ounces of water per day you should drink.  So for instance, if you're 200 pounds, that would be 100 ounces of water per day.         Mediterranean Diet  Why follow it?  Research shows. . Those who follow the Mediterranean diet have a reduced risk of heart disease  . The diet is associated with a reduced incidence of Parkinson's and Alzheimer's diseases . People following the diet may have longer life expectancies and lower rates of chronic diseases  . The Dietary Guidelines for Americans recommends the Mediterranean diet as an eating plan to promote health and prevent disease  What Is the Mediterranean Diet?  . Healthy eating plan based on typical foods and recipes of Mediterranean-style cooking . The diet is primarily a plant based diet; these foods should make up a majority of meals   Starches - Plant based foods should make up a majority of meals - They are an important sources of vitamins, minerals, energy, antioxidants, and fiber - Choose whole grains, foods high in fiber and minimally processed items  - Typical grain sources include wheat, oats, barley, corn, brown rice, bulgar, farro, millet, polenta, couscous  - Various types of beans include chickpeas, lentils, fava beans, black beans, white beans   Fruits  Veggies - Large quantities of antioxidant rich fruits & veggies; 6 or more servings  - Vegetables can be eaten raw or lightly drizzled with oil and cooked  - Vegetables common to the traditional Mediterranean Diet include: artichokes, arugula, beets, broccoli, brussel sprouts, cabbage, carrots, celery, collard greens, cucumbers, eggplant, kale, leeks, lemons, lettuce, mushrooms, okra, onions, peas, peppers, potatoes, pumpkin, radishes, rutabaga, shallots, spinach, sweet potatoes, turnips, zucchini - Fruits common to the Mediterranean Diet include: apples, apricots, avocados, cherries, clementines, dates, figs, grapefruits, grapes, melons, nectarines, oranges, peaches, pears, pomegranates, strawberries, tangerines  Fats - Replace butter and margarine with healthy oils, such as olive oil, canola oil, and tahini  - Limit nuts to no more than a handful a  day  - Nuts include walnuts, almonds, pecans, pistachios, pine nuts  - Limit or avoid candied, honey roasted or heavily salted nuts - Olives are central to the [employer] - can be eaten whole or used in a variety of dishes   Meats Protein - Limiting red meat: no more than a few times a month - When eating red meat: choose lean cuts and keep the portion to the size of deck of cards - Eggs: approx. 0 to 4 times a week  - Fish and lean poultry: at least 2 a week  - Healthy protein sources include, chicken, [place], lean beef, lamb - Increase intake of seafood such as tuna, salmon, trout, mackerel, shrimp, scallops - Avoid or limit high fat processed meats such as sausage and bacon  Dairy - Include moderate amounts of low fat dairy products  - Focus on healthy dairy such as fat free yogurt, skim milk, low or reduced fat cheese - Limit dairy products higher in fat  such as whole or 2% milk, cheese, ice cream  Alcohol - Moderate amounts of red wine is ok  - No more than 5 oz daily for women (all ages) and men older than age 36  - No more than 10 oz of wine daily for men younger than 72  Other - Limit sweets and other desserts  - Use herbs and spices instead of salt to flavor foods  - Herbs and spices common to the traditional Mediterranean Diet include: basil, bay leaves, chives, cloves, cumin, fennel, garlic, lavender, marjoram, mint, oregano, parsley, pepper, rosemary, sage, savory, sumac, tarragon, thyme   It's not just a diet, it's a lifestyle:  . The Mediterranean diet includes lifestyle factors typical of those in the region  . Foods, drinks and meals are best eaten with others and savored . Daily physical activity is important for overall good health . This could be strenuous exercise like running and aerobics . This could also be more leisurely activities such as walking, housework, yard-work, or taking the stairs . Moderation is the key; a balanced and healthy diet accommodates  most foods and drinks . Consider portion sizes and frequency of consumption of certain foods   Meal Ideas & Options:  . Breakfast:  o Whole wheat toast or whole wheat English muffins with peanut butter & hard boiled egg o Steel cut oats topped with apples & cinnamon and skim milk  o Fresh fruit: banana, strawberries, melon, berries, peaches  o Smoothies: strawberries, bananas, greek yogurt, peanut butter o Low fat greek yogurt with blueberries and granola  o Egg white omelet with spinach and mushrooms o Breakfast couscous: whole wheat couscous, apricots, skim milk, cranberries  . Sandwiches:  o Hummus and grilled vegetables (peppers, zucchini, squash) on whole wheat bread   o Grilled chicken on whole wheat pita with lettuce, tomatoes, cucumbers or tzatziki  o Tuna salad on whole wheat bread: tuna salad made with greek yogurt, olives, red peppers, capers, green onions o Garlic rosemary lamb pita: lamb sauted with garlic, rosemary, salt & pepper; add lettuce, cucumber, greek yogurt to pita - flavor with lemon juice and black pepper  . Seafood:  o Mediterranean grilled salmon, seasoned with garlic, basil, parsley, lemon juice and black pepper o Shrimp, lemon, and spinach whole-grain pasta salad made with low fat greek yogurt  o Seared scallops with lemon orzo  o Seared tuna steaks seasoned salt, pepper, coriander topped with tomato mixture of olives, tomatoes, olive oil, minced garlic, parsley, green onions and cappers  . Meats:  o Herbed greek chicken salad with kalamata olives, cucumber, feta  o Red bell peppers stuffed with spinach, bulgur, lean ground beef (or lentils) & topped with feta   o Kebabs: skewers of chicken, tomatoes, onions, zucchini, squash  o [place] burgers: made with red onions, mint, dill, lemon juice, feta cheese topped with roasted red peppers . Vegetarian o Cucumber salad: cucumbers, artichoke hearts, celery, red onion, feta cheese, tossed in olive oil & lemon juice   o Hummus and whole grain pita points with a greek salad (lettuce, tomato, feta, olives, cucumbers, red onion) o Lentil soup with celery, carrots made with vegetable broth, garlic, salt and pepper  o Tabouli salad: parsley, bulgur, mint, scallions, cucumbers, tomato, radishes, lemon juice, olive oil, salt and pepper.

## 2019-01-24 LAB — LIPID PANEL
Chol/HDL Ratio: 2.4 ratio (ref 0.0–4.4)
Cholesterol, Total: 206 mg/dL — ABNORMAL HIGH (ref 100–199)
HDL: 87 mg/dL (ref >39)
LDL Chol Calc (NIH): 103 mg/dL — ABNORMAL HIGH (ref 0–99)
Triglycerides: 89 mg/dL (ref 0–149)
VLDL Cholesterol Cal: 16 mg/dL (ref 5–40)

## 2019-01-24 LAB — CBC WITH DIFFERENTIAL/PLATELET
Basophils Absolute: 0 10*3/uL (ref 0.0–0.2)
Basos: 0 %
EOS (ABSOLUTE): 0.3 10*3/uL (ref 0.0–0.4)
Eos: 6 %
Hematocrit: 38 % (ref 34.0–46.6)
Hemoglobin: 12.6 g/dL (ref 11.1–15.9)
Immature Grans (Abs): 0 10*3/uL (ref 0.0–0.1)
Immature Granulocytes: 0 %
Lymphocytes Absolute: 1.4 10*3/uL (ref 0.7–3.1)
Lymphs: 27 %
MCH: 27.6 pg (ref 26.6–33.0)
MCHC: 33.2 g/dL (ref 31.5–35.7)
MCV: 83 fL (ref 79–97)
Monocytes Absolute: 0.4 10*3/uL (ref 0.1–0.9)
Monocytes: 8 %
Neutrophils Absolute: 3.1 10*3/uL (ref 1.4–7.0)
Neutrophils: 59 %
Platelets: 319 10*3/uL (ref 150–450)
RBC: 4.56 x10E6/uL (ref 3.77–5.28)
RDW: 13.3 % (ref 11.7–15.4)
WBC: 5.3 10*3/uL (ref 3.4–10.8)

## 2019-01-24 LAB — HEMOGLOBIN A1C
Est. average glucose Bld gHb Est-mCnc: 111 mg/dL
Hgb A1c MFr Bld: 5.5 % (ref 4.8–5.6)

## 2019-01-24 LAB — COMPREHENSIVE METABOLIC PANEL
ALT: 31 IU/L (ref 0–32)
AST: 28 IU/L (ref 0–40)
Albumin/Globulin Ratio: 1.7 (ref 1.2–2.2)
Albumin: 4 g/dL (ref 3.8–4.8)
Alkaline Phosphatase: 106 IU/L (ref 39–117)
BUN/Creatinine Ratio: 16 (ref 12–28)
BUN: 13 mg/dL (ref 8–27)
Bilirubin Total: 0.4 mg/dL (ref 0.0–1.2)
CO2: 25 mmol/L (ref 20–29)
Calcium: 9.9 mg/dL (ref 8.7–10.3)
Chloride: 103 mmol/L (ref 96–106)
Creatinine, Ser: 0.81 mg/dL (ref 0.57–1.00)
GFR calc Af Amer: 88 mL/min/{1.73_m2} (ref >59)
GFR calc non Af Amer: 76 mL/min/{1.73_m2} (ref >59)
Globulin, Total: 2.3 g/dL (ref 1.5–4.5)
Glucose: 93 mg/dL (ref 65–99)
Potassium: 4.2 mmol/L (ref 3.5–5.2)
Sodium: 140 mmol/L (ref 134–144)
Total Protein: 6.3 g/dL (ref 6.0–8.5)

## 2019-01-24 LAB — TSH: TSH: 0.661 u[IU]/mL (ref 0.450–4.500)

## 2019-01-24 LAB — T4, FREE: Free T4: 1.31 ng/dL (ref 0.82–1.77)

## 2019-01-24 LAB — VITAMIN D 25 HYDROXY (VIT D DEFICIENCY, FRACTURES): Vit D, 25-Hydroxy: 53 ng/mL (ref 30.0–100.0)

## 2019-01-28 ENCOUNTER — Telehealth: Payer: Self-pay | Admitting: Family Medicine

## 2019-02-23 NOTE — Telephone Encounter (Signed)
Entered in error

## 2019-05-26 ENCOUNTER — Ambulatory Visit
Admission: RE | Admit: 2019-05-26 | Discharge: 2019-05-26 | Disposition: A | Discharge status: Home / Self Care | Payer: Medicare Other | Source: Ambulatory Visit | Attending: Family Medicine | Admitting: Family Medicine

## 2019-05-26 ENCOUNTER — Other Ambulatory Visit: Payer: Self-pay

## 2019-05-26 DIAGNOSIS — Z780 Asymptomatic menopausal state: Secondary | ICD-10-CM | POA: Diagnosis not present

## 2019-05-26 DIAGNOSIS — Z13820 Encounter for screening for osteoporosis: Secondary | ICD-10-CM

## 2019-05-26 DIAGNOSIS — M8589 Other specified disorders of bone density and structure, multiple sites: Secondary | ICD-10-CM | POA: Diagnosis not present

## 2019-06-03 ENCOUNTER — Other Ambulatory Visit: Payer: Medicare Other

## 2019-06-25 ENCOUNTER — Other Ambulatory Visit: Payer: Self-pay | Admitting: Family Medicine

## 2019-06-25 DIAGNOSIS — E039 Hypothyroidism, unspecified: Secondary | ICD-10-CM

## 2019-06-25 DIAGNOSIS — I10 Essential (primary) hypertension: Secondary | ICD-10-CM

## 2019-06-25 MED ORDER — LEVOTHYROXINE SODIUM 100 MCG PO TABS: 100.0000 ug | ORAL_TABLET | Freq: Every day | ORAL | 0 refills | Status: DC

## 2019-06-25 MED ORDER — CHLORTHALIDONE 25 MG PO TABS: 25.0000 mg | ORAL_TABLET | Freq: Every morning | ORAL | 0 refills | Status: DC

## 2019-06-25 NOTE — Telephone Encounter (Signed)
Patient called states she has 1 pill left & a OV/Appt is required for any refills--advised her that no OV available til 3/4 & scheduled her ---Patient request in the mean time for a courtesy refill be sent to pharmacy.  1)-- levothyroxine (SYNTHROID) 100 MCG tablet [id number]   Order Details Dose: 100 mcg Route: Oral Frequency: Daily before breakfast  Dispense Quantity: 90 tablet Refills: 1   Indications of Use: Hypothyroidism      Sig: Take 1 tablet (100 mcg total) by mouth daily before breakfast. Patient needs office visit for further refills.   &   2)---chlorthalidone (HYGROTON) 25 MG tablet [id number]   Order Details Dose: 25 mg Route: Oral Frequency: Every morning - 10a  Dispense Quantity: 90 tablet Refills: 1       Sig: Take 1 tablet (25 mg total) by mouth every morning. Needs office visit for further refills.   -- Forwarding request to med asst that if approved send refill order to :  chlorthalidone (HYGROTON) 25 MG tablet [id number]   Order Details Dose: 25 mg Route: Oral Frequency: Every morning - 10a  Dispense Quantity: 90 tablet Refills: 1       Sig: Take 1 tablet (25 mg total) by mouth every morning. Needs office visit for further refills.   --glh

## 2019-07-02 ENCOUNTER — Ambulatory Visit (INDEPENDENT_AMBULATORY_CARE_PROVIDER_SITE_OTHER): Payer: Medicare PPO | Admitting: Family Medicine

## 2019-07-02 ENCOUNTER — Other Ambulatory Visit: Payer: Self-pay

## 2019-07-02 ENCOUNTER — Encounter: Payer: Self-pay | Admitting: Family Medicine

## 2019-07-02 VITALS — BP 131/89 | HR 79 | Temp 98.2°F | Resp 12 | Ht 62.0 in | Wt 190.3 lb

## 2019-07-02 DIAGNOSIS — R5382 Chronic fatigue, unspecified: Secondary | ICD-10-CM

## 2019-07-02 DIAGNOSIS — E559 Vitamin D deficiency, unspecified: Secondary | ICD-10-CM

## 2019-07-02 DIAGNOSIS — D509 Iron deficiency anemia, unspecified: Secondary | ICD-10-CM | POA: Diagnosis not present

## 2019-07-02 DIAGNOSIS — E7800 Pure hypercholesterolemia, unspecified: Secondary | ICD-10-CM

## 2019-07-02 DIAGNOSIS — E039 Hypothyroidism, unspecified: Secondary | ICD-10-CM | POA: Diagnosis not present

## 2019-07-02 DIAGNOSIS — Z8639 Personal history of other endocrine, nutritional and metabolic disease: Secondary | ICD-10-CM | POA: Diagnosis not present

## 2019-07-02 DIAGNOSIS — I10 Essential (primary) hypertension: Secondary | ICD-10-CM | POA: Diagnosis not present

## 2019-07-02 DIAGNOSIS — R6889 Other general symptoms and signs: Secondary | ICD-10-CM

## 2019-07-02 DIAGNOSIS — Z9884 Bariatric surgery status: Secondary | ICD-10-CM | POA: Diagnosis not present

## 2019-07-02 MED ORDER — CHLORTHALIDONE 25 MG PO TABS: 25.0000 mg | ORAL_TABLET | Freq: Every day | ORAL | 1 refills | Status: DC

## 2019-07-02 MED ORDER — LEVOTHYROXINE SODIUM 100 MCG PO TABS: 100.0000 ug | ORAL_TABLET | Freq: Every day | ORAL | 1 refills | Status: DC

## 2019-07-02 NOTE — Progress Notes (Signed)
Impression and Recommendations:    1. Hypertension, unspecified type   2. Hypothyroidism, unspecified type   3. Vitamin D deficiency   4. History of non anemic vitamin B12 deficiency   5. Elevated LDL cholesterol level   6. Iron deficiency anemia, unspecified iron deficiency anemia type   7. Cold intolerance   8. Chronic fatigue   9. S/P bariatric surgery     - Last labs obtained last OV, 01/23/2019.  COVID-19 Vaccination - Per patient, has obtained her first dose of COVID-19 Moderna vaccination. - Counseling provided to patient regarding expectations for second dose. - Patient knows to call in with any concerns or questions. - Will continue to monitor.  Hypertension, unspecified type - Blood pressure currently is stable, at goal. - Patient will continue current treatment regimen.  See med list.  - Counseled patient on pathophysiology of disease and discussed various treatment options, which always includes dietary and lifestyle modification as first line.   - Lifestyle changes such as dash and heart healthy diets and engaging in a regular exercise program discussed extensively with patient.   - Ambulatory blood pressure monitoring encouraged at least 3 times weekly.  Keep log and bring in every office visit.  Reminded patient that if they ever feel poorly in any way, to check their blood pressure and pulse.  - We will continue to monitor.  Hypothyroidism, unspecified type - Asymptomatic at this time, stable. - Continue management as established.  See med list. - Will continue to monitor and re-check as discussed.  Vitamin D deficiency - 53.0 last check, WNL. - Discussed goal of maintenance in 40-60 range. - Continue supplementation as established.  See med list. - Will continue to monitor and re-check at least once yearly as discussed.  History of non anemic vitamin B12 deficiency - Will  re-check B12 today if last check was not within a year.  - Will continue  to monitor and re-check once yearly at least.  Elevated LDL Cholesterol Level - Last FLP obtained 5 months ago: Triglycerides = 89, WNL, up from 62 prior. HDL = 87, WNL, up from 80 prior. LDL = 103, elevated, up from 92 one year prior.  The 10-year ASCVD risk score Mikey Bussing DC Jr., et al., 2013) is: 7.4%  - Discussed that patient's LDL was elevated last check. - FLP not obtained today.  Patient declines beginning statins at this time if results are abnormal.  - Prudent dietary changes such as low saturated & trans fat diets for hyperlipidemia and low carb diets for hypertriglyceridemia discussed with patient.    - Encouraged patient to follow AHA guidelines for regular exercise and also engage in weight loss if BMI above 25.   - Educational handouts provided at patient's desire and/ or told to look online at the [place] website for further information.  - We will continue to monitor and re-check at least once yearly as discussed.  Iron Deficiency Anemia - Stable at this time. - Continue iron supplementation as established.  See med list.  - Will continue to monitor and re-check today if not checked in past year.  BMI Counseling - Body mass index is 34.81 kg/m Explained to patient what BMI refers to, and what it means medically.    Told patient to think about it as a "medical risk stratification measurement" and how increasing BMI is associated with increasing risk/ or worsening state of various diseases such as hypertension, hyperlipidemia, diabetes, premature OA, depression  etc.  American Heart Association guidelines for healthy diet, basically Mediterranean diet, and exercise guidelines of 30 minutes 5 days per week or more discussed in detail.  Health counseling performed.  All questions answered.  -  Patient is considering referral to [facility] and will call if she desires referral.  Health Counseling & Preventative Maintenance - Advised  patient to continue working toward exercising to improve overall mental, physical, and emotional health.    - Reviewed the "spokes of the wheel" of mood and health management.  Stressed the importance of ongoing prudent habits, including regular exercise, appropriate sleep hygiene, healthful dietary habits, and prayer/meditation to relax.  - Encouraged patient to engage in daily physical activity as tolerated, especially a formal exercise routine.  Recommended that the patient eventually strive for at least 150 minutes of moderate cardiovascular activity per week according to guidelines established by the [facility].   - Healthy dietary habits encouraged, including low-carb, and high amounts of lean protein in diet.   - Patient should also consume adequate amounts of water.    Orders Placed This Encounter  Procedures  . Vitamin B12  . Folate  . Iron and TIBC  . Ferritin  . VITAMIN D 25 Hydroxy (Vit-D Deficiency, Fractures)  . Vitamin B1    Meds ordered this encounter  Medications  . levothyroxine (SYNTHROID) 100 MCG tablet    Sig: Take 1 tablet (100 mcg total) by mouth daily before breakfast. Patient needs office visit for further refills.    Dispense:  90 tablet    Refill:  1  . chlorthalidone (HYGROTON) 25 MG tablet    Sig: Take 1 tablet (25 mg total) by mouth daily.    Dispense:  90 tablet    Refill:  1    Medications Discontinued During This Encounter  Medication Reason  . Cholecalciferol (DIALYVITE VITAMIN D 5000) 125 MCG (5000 UT) capsule Error  . chlorthalidone (HYGROTON) 25 MG tablet Reorder  . levothyroxine (SYNTHROID) 100 MCG tablet Reorder     Gross side effects, risk and benefits, and alternatives of medications and treatment plan in general discussed with patient.  Patient is aware that all medications have potential side effects and we are unable to predict every side effect or drug-drug interaction that may occur.   Patient will call with any questions prior to using  medication if they have concerns.    Expresses verbal understanding and consents to current therapy and treatment regimen.  No barriers to understanding were identified.  Red flag symptoms and signs discussed in detail.  Patient expressed understanding regarding what to do in case of emergency\urgent symptoms  Please see AVS handed out to patient at the end of our visit for further patient instructions/ counseling done pertaining to today's office visit.   Return for f/up end April, otherwise 4 months for BP, hypothyroidism, chronic f/up.     Note:  This note was prepared with assistance of Dragon voice recognition software. Occasional wrong-word or sound-a-like substitutions may have occurred due to the inherent limitations of voice recognition software.   The [place] was signed into law in 2016 which includes the topic of electronic health records.  This provides immediate access to information in MyChart.  This includes consultation notes, operative notes, office notes, lab results and pathology reports.  If you have any questions about what you read please let us know at your next visit or call us at the office.  We are right here with  you.   This case required medical decision making of at least moderate complexity.  This document serves as a record of services personally performed by Mellody Dance, DO. It was created on her behalf by Toni Amend, a trained medical scribe. The creation of this record is based on the scribe's personal observations and the provider's statements to them.   This case required medical decision making of at least moderate complexity. The above documentation from Toni Amend, medical scribe, has been reviewed by Marjory Sneddon,  D.O.       --------------------------------------------------------------------------------------------------------------------------------------------------------------------------------------------------------------------------------------------    Subjective:     Deborah Petersen, am serving as scribe for Dr.Marvens Hollars.   HPI: Deborah Petersen is a 69 y.o. female who presents to [place] at [facility] today for issues as discussed below.  - Lifestyle Habits Notes she "likes to eat but doesn't like to exercise."  Notes she has bariatric surgery in the past and her weight gain was exacerbated by use of steroids in the past for a suspected diagnosis of lupus.  Notes later in life, it was discovered that she never had lupus.  For assistance, to motivate herself, she is using "[facility]."  Notes her husband is a marathon runner.  He's age 78 and notes "he didn't start [marathons] until age 67."  Notes that her passion is travelling.  She plans to visit [place] in near future.  - COVID-19 Vaccination She has had her first dose of the Moderna vaccine, with her second dosage coming up.  When she came back from the cruise last March, she believes she had COVID-19.  Notes "I was so sick for a couple of weeks, I was sure I had pneumonia."  - Hypothyroidism Patient is asymptomatic at this time.  HPI:  Hypertension:  Doesn't check her blood pressure at home.  Notes usually in 120-130 over 80, when she checks it in the store.  - Patient reports good compliance with medication and/or lifestyle modification  - Her denies acute concerns or problems related to treatment plan  - She denies new onset of: chest pain, exercise intolerance, shortness of breath, dizziness, visual changes, headache, lower extremity swelling or claudication.   Last 3 blood pressure readings in our office are as follows: BP Readings from Last 3 Encounters:  07/02/19 131/89  01/23/19 127/85   12/23/18 123/84   Filed Weights   07/02/19 0851  Weight: 190 lb 4.8 oz (86.3 kg)   HPI:  Hyperlipidemia:  69 y.o. female here for cholesterol follow-up.   - Patient reports good compliance with treatment plan of:  medication and/ or lifestyle management.    - Patient denies any acute concerns or problems with management plan   - She denies new onset of: myalgias, arthralgias, increased fatigue more than normal, chest pains, exercise intolerance, shortness of breath, dizziness, visual changes, headache, lower extremity swelling or claudication.   Most recent cholesterol panel was:  Lab Results  Component Value Date   CHOL 206 (H) 01/23/2019   HDL 87 01/23/2019   LDLCALC 103 (H) 01/23/2019   TRIG 89 01/23/2019   CHOLHDL 2.4 01/23/2019   Hepatic Function Latest Ref Rng & Units 01/23/2019 12/08/2018 12/07/2018  Total Protein 6.0 - 8.5 g/dL 6.3 6.3(L) 7.5  Albumin 3.8 - 4.8 g/dL 4.0 3.1(L) 3.9  AST 0 - 40 IU/L 28 21 32  ALT 0 - 32 IU/L 31 23 29   Alk Phosphatase 39 - 117 IU/L 106 82 82  Total  Bilirubin 0.0 - 1.2 mg/dL 0.4 1.2 1.5(H)      Wt Readings from Last 3 Encounters:  07/02/19 190 lb 4.8 oz (86.3 kg)  01/23/19 187 lb (84.8 kg)  12/23/18 179 lb (81.2 kg)   BP Readings from Last 3 Encounters:  07/02/19 131/89  01/23/19 127/85  12/23/18 123/84   Pulse Readings from Last 3 Encounters:  07/02/19 79  01/23/19 75  12/23/18 75   BMI Readings from Last 3 Encounters:  07/02/19 34.81 kg/m  01/23/19 33.13 kg/m  12/23/18 32.74 kg/m   Depression screen [facility] 2/9 07/02/2019 01/23/2019 12/23/2018  Decreased Interest 0 0 0  Down, Depressed, Hopeless 0 0 0  PHQ - 2 Score 0 0 0  Altered sleeping 0 0 0  Tired, decreased energy 0 0 0  Change in appetite 0 0 0  Feeling bad or failure about yourself  0 0 0  Trouble concentrating 0 0 0  Moving slowly or fidgety/restless 0 0 0  Suicidal thoughts 0 0 0  PHQ-9 Score 0 0 0  Difficult doing work/chores - - Not difficult at all   Some recent data might be hidden   No flowsheet data found.    Patient Care Team    Relationship Specialty Notifications Start End  Mellody Dance, DO PCP - General Family Medicine  10/03/16   Carol Ada, MD Consulting Physician Gastroenterology  10/03/16   Susa Day, MD Consulting Physician Orthopedic Surgery  10/03/16   Landis Martins, DPM Consulting Physician Podiatry  12/17/17      Patient Active Problem List   Diagnosis Date Noted  . S/P bariatric surgery 07/02/2019  . Cold intolerance 07/02/2019  . Hearing loss of right ear 01/23/2019  . Acute cholecystitis 12/07/2018  . Febrile illness 07/20/2018  . History of abdominal hernia W MESH REPAIR 2003 04/17/2018  . Itching with irritation- abdominal wall overlaying mesh 04/17/2018  . Hypertension 12/17/2017  . Chronic fatigue 12/17/2017  . Status post R bunionectomy- 4/19 12/17/2017  . Laryngopharyngeal reflux (LPR) 08/22/2017  . Acute pharyngitis 08/22/2017  . h/o Elevated LDL cholesterol level 07/25/2017  . Cervical pain (neck) 02/12/2017  . Hypothyroidism 10/03/2016  . Cyst of meniscus of left knee- s/p sx Dr Tonita Cong- [facility] 10/03/2016  . S/P hysterectomy- no cervix 10/03/2016  . History of non anemic vitamin B12 deficiency 10/03/2016  . BMI 35.0-35.9,adult 10/03/2016  . h/o RA (rheumatoid arthritis) - cured by faith and prayer   . Dental crowns present   . 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 Vitamin D deficiency   . Fe Def Anemia   . Macromastia 10/29/2011  . Other bacterial meningitis 05/01/2003    Past Medical history, Surgical history, Family history, Social history, Allergies and Medications have been entered into the medical record, reviewed and changed as needed.    Current Meds  Medication Sig  . acetaminophen (TYLENOL) 500 MG tablet Take 2 tablets (1,000 mg total) by mouth every 8 (eight) hours as needed.  Marland Kitchen aspirin EC 81 MG tablet Take 4 tablets (325 mg total)  by mouth every morning. (Patient taking differently: Take 81 mg by mouth every morning. )  . Cholecalciferol (VITAMIN D3) 5000 units TABS 5,000 IU OTC vitamin D3 daily.  . COD LIVER OIL PO Take 1 capsule by mouth every morning.   . Iron-FA-B Cmp-C-Biot-Probiotic (FUSION PLUS) CAPS Take 1 capsule by mouth daily.  . Turmeric 500 MG TABS Take 1 tablet by mouth daily.   . [  DISCONTINUED] chlorthalidone (HYGROTON) 25 MG tablet Take 1 tablet (25 mg total) by mouth every morning. Needs office visit for further refills.  . [DISCONTINUED] levothyroxine (SYNTHROID) 100 MCG tablet Take 1 tablet (100 mcg total) by mouth daily before breakfast. Patient needs office visit for further refills.  . chlorthalidone (HYGROTON) 25 MG tablet Take 1 tablet (25 mg total) by mouth daily.  Marland Kitchen levothyroxine (SYNTHROID) 100 MCG tablet Take 1 tablet (100 mcg total) by mouth daily before breakfast. Patient needs office visit for further refills.    Allergies:  Allergies  Allergen Reactions  . Fruit & Vegetable Daily [Nutritional Supplements] Swelling    Allergic to all fresh fruit  . Erythromycin Nausea And Vomiting     Review of Systems:  A fourteen system review of systems was performed and found to be positive as per HPI.   Objective:   Blood pressure 131/89, pulse 79, temperature 98.2 F (36.8 C), temperature source Oral, resp. rate 12, height 5' 2"  (1.575 m), weight 190 lb 4.8 oz (86.3 kg), SpO2 97 %. Body mass index is 34.81 kg/m. General:  Well Developed, well nourished, appropriate for stated age.  Neuro:  Alert and oriented,  extra-ocular muscles intact  HEENT:  Normocephalic, atraumatic, neck supple, no carotid bruits appreciated  Skin:  no gross rash, warm, pink. Cardiac:  RRR, S1 S2 Respiratory:  ECTA B/L and A/P, Not using accessory muscles, speaking in full sentences- unlabored. Vascular:  Ext warm, no cyanosis apprec.; cap RF less 2 sec. Psych:  No HI/SI, judgement and insight good, Euthymic mood.  Full Affect.

## 2019-07-02 NOTE — Patient Instructions (Signed)
Guidelines for a Low Cholesterol, Low Saturated Fat Diet   Fats - Limit total intake of fats and oils. - Avoid butter, stick margarine, shortening, lard, palm and coconut oils. - Limit mayonnaise, salad dressings, gravies and sauces, unless they are homemade with low-fat ingredients. - Limit chocolate. - Choose low-fat and nonfat products, such as low-fat mayonnaise, low-fat or non-hydrogenated peanut butter, low-fat or fat-free salad dressings and nonfat gravy. - Use vegetable oil, such as canola or olive oil. - Look for margarine that does not contain trans fatty acids. - Use nuts in moderate amounts. - Read ingredient labels carefully to determine both amount and type of fat present in foods. Limit saturated and trans fats! - Avoid high-fat processed and convenience foods.  Meats and Meat Alternatives - Choose fish, chicken, [place] and lean meats. - Use dried beans, peas, lentils and tofu. - Limit egg yolks to three to four per week. - If you eat red meat, limit to no more than three servings per week and choose loin or round cuts. - Avoid fatty meats, such as bacon, sausage, franks, luncheon meats and ribs. - Avoid all organ meats, including liver.  Dairy - Choose nonfat or low-fat milk, yogurt and cottage cheese. - Most cheeses are high in fat. Choose cheeses made from non-fat milk, such as mozzarella and ricotta cheese. - Choose light or fat-free cream cheese and sour cream. - Avoid cream and sauces made with cream.  Fruits and Vegetables - Eat a wide variety of fruits and vegetables. - Use lemon juice, vinegar or "mist" olive oil on vegetables. - Avoid adding sauces, fat or oil to vegetables.  Breads, Cereals and Grains - Choose whole-grain breads, cereals, pastas and rice. - Avoid high-fat snack foods, such as granola, cookies, pies, pastries, doughnuts and croissants.  Cooking Tips - Avoid deep fried foods. - Trim visible fat off meats and remove skin from poultry  before cooking. - Bake, broil, boil, poach or roast poultry, fish and lean meats. - Drain and discard fat that drains out of meat as you cook it. - Add little or no fat to foods. - Use vegetable oil sprays to grease pans for cooking or baking. - Steam vegetables. - Use herbs or no-oil marinades to flavor foods. Please realize, EXERCISE IS MEDICINE!  -  American Heart Association [facility]) guidelines for exercise : If you are in good health, without any medical conditions, you should engage in 150-300 minutes of moderate intensity aerobic activity per week.  This means you should be huffing and puffing throughout your workout.   Engaging in regular exercise will improve brain function and memory, as well as improve mood, boost immune system and help with weight management.  As well as the other, more well-known effects of exercise such as decreasing blood sugar levels, decreasing blood pressure,  and decreasing bad cholesterol levels/ increasing good cholesterol levels.     -  The AHA strongly endorses consumption of a diet that contains a variety of foods from all the food categories with an emphasis on fruits and vegetables; fat-free and low-fat dairy products; cereal and grain products; legumes and nuts; and fish, poultry, and/or extra lean meats.    Excessive food intake, especially of foods high in saturated and trans fats, sugar, and salt, should be avoided.    Adequate water intake of roughly 1/2 of your weight in pounds, should equal the ounces of water per day you should drink.  So for instance, if you're 200 pounds,  that would be 100 ounces of water per day.         Mediterranean Diet  Why follow it? Research shows. . Those who follow the Mediterranean diet have a reduced risk of heart disease  . The diet is associated with a reduced incidence of Parkinson's and Alzheimer's diseases . People following the diet may have longer life expectancies and lower rates of chronic diseases  . The  Dietary Guidelines for Americans recommends the Mediterranean diet as an eating plan to promote health and prevent disease  What Is the Mediterranean Diet?  . Healthy eating plan based on typical foods and recipes of Mediterranean-style cooking . The diet is primarily a plant based diet; these foods should make up a majority of meals   Starches - Plant based foods should make up a majority of meals - They are an important sources of vitamins, minerals, energy, antioxidants, and fiber - Choose whole grains, foods high in fiber and minimally processed items  - Typical grain sources include wheat, oats, barley, corn, brown rice, bulgar, farro, millet, polenta, couscous  - Various types of beans include chickpeas, lentils, fava beans, black beans, white beans   Fruits  Veggies - Large quantities of antioxidant rich fruits & veggies; 6 or more servings  - Vegetables can be eaten raw or lightly drizzled with oil and cooked  - Vegetables common to the traditional Mediterranean Diet include: artichokes, arugula, beets, broccoli, brussel sprouts, cabbage, carrots, celery, collard greens, cucumbers, eggplant, kale, leeks, lemons, lettuce, mushrooms, okra, onions, peas, peppers, potatoes, pumpkin, radishes, rutabaga, shallots, spinach, sweet potatoes, turnips, zucchini - Fruits common to the Mediterranean Diet include: apples, apricots, avocados, cherries, clementines, dates, figs, grapefruits, grapes, melons, nectarines, oranges, peaches, pears, pomegranates, strawberries, tangerines  Fats - Replace butter and margarine with healthy oils, such as olive oil, canola oil, and tahini  - Limit nuts to no more than a handful a day  - Nuts include walnuts, almonds, pecans, pistachios, pine nuts  - Limit or avoid candied, honey roasted or heavily salted nuts - Olives are central to the [employer] - can be eaten whole or used in a variety of dishes   Meats Protein - Limiting red meat: no more than a few  times a month - When eating red meat: choose lean cuts and keep the portion to the size of deck of cards - Eggs: approx. 0 to 4 times a week  - Fish and lean poultry: at least 2 a week  - Healthy protein sources include, chicken, [place], lean beef, lamb - Increase intake of seafood such as tuna, salmon, trout, mackerel, shrimp, scallops - Avoid or limit high fat processed meats such as sausage and bacon  Dairy - Include moderate amounts of low fat dairy products  - Focus on healthy dairy such as fat free yogurt, skim milk, low or reduced fat cheese - Limit dairy products higher in fat such as whole or 2% milk, cheese, ice cream  Alcohol - Moderate amounts of red wine is ok  - No more than 5 oz daily for women (all ages) and men older than age 41  - No more than 10 oz of wine daily for men younger than 53  Other - Limit sweets and other desserts  - Use herbs and spices instead of salt to flavor foods  - Herbs and spices common to the traditional Mediterranean Diet include: basil, bay leaves, chives, cloves, cumin, fennel, garlic, lavender, marjoram, mint, oregano, parsley, pepper, rosemary,  sage, savory, sumac, tarragon, thyme   It's not just a diet, it's a lifestyle:  . The Mediterranean diet includes lifestyle factors typical of those in the region  . Foods, drinks and meals are best eaten with others and savored . Daily physical activity is important for overall good health . This could be strenuous exercise like running and aerobics . This could also be more leisurely activities such as walking, housework, yard-work, or taking the stairs . Moderation is the key; a balanced and healthy diet accommodates most foods and drinks . Consider portion sizes and frequency of consumption of certain foods   Meal Ideas & Options:  . Breakfast:  o Whole wheat toast or whole wheat English muffins with peanut butter & hard boiled egg o Steel cut oats topped with apples & cinnamon and skim milk   o Fresh fruit: banana, strawberries, melon, berries, peaches  o Smoothies: strawberries, bananas, greek yogurt, peanut butter o Low fat greek yogurt with blueberries and granola  o Egg white omelet with spinach and mushrooms o Breakfast couscous: whole wheat couscous, apricots, skim milk, cranberries  . Sandwiches:  o Hummus and grilled vegetables (peppers, zucchini, squash) on whole wheat bread   o Grilled chicken on whole wheat pita with lettuce, tomatoes, cucumbers or tzatziki  o Tuna salad on whole wheat bread: tuna salad made with greek yogurt, olives, red peppers, capers, green onions o Garlic rosemary lamb pita: lamb sauted with garlic, rosemary, salt & pepper; add lettuce, cucumber, greek yogurt to pita - flavor with lemon juice and black pepper  . Seafood:  o Mediterranean grilled salmon, seasoned with garlic, basil, parsley, lemon juice and black pepper o Shrimp, lemon, and spinach whole-grain pasta salad made with low fat greek yogurt  o Seared scallops with lemon orzo  o Seared tuna steaks seasoned salt, pepper, coriander topped with tomato mixture of olives, tomatoes, olive oil, minced garlic, parsley, green onions and cappers  . Meats:  o Herbed greek chicken salad with kalamata olives, cucumber, feta  o Red bell peppers stuffed with spinach, bulgur, lean ground beef (or lentils) & topped with feta   o Kebabs: skewers of chicken, tomatoes, onions, zucchini, squash  o [place] burgers: made with red onions, mint, dill, lemon juice, feta cheese topped with roasted red peppers . Vegetarian o Cucumber salad: cucumbers, artichoke hearts, celery, red onion, feta cheese, tossed in olive oil & lemon juice  o Hummus and whole grain pita points with a greek salad (lettuce, tomato, feta, olives, cucumbers, red onion) o Lentil soup with celery, carrots made with vegetable broth, garlic, salt and pepper  o Tabouli salad: parsley, bulgur, mint, scallions, cucumbers, tomato, radishes, lemon  juice, olive oil, salt and pepper.

## 2019-07-03 ENCOUNTER — Telehealth: Payer: Self-pay | Admitting: Family Medicine

## 2019-07-03 NOTE — Telephone Encounter (Signed)
Patient labs drawn yesterday. Unable to run B1 lab due to correct tube not being drawn. Per Opalski we do need this lab from patient. Per Opalski if patient would like to wait until next time labs are drawn this is ok-if wants done now please schedule apt for patient labs.   Dorothea Ogle called patient with no answer and unable to leave voicemail. AS, CMA

## 2019-07-08 LAB — IRON AND TIBC

## 2019-07-09 LAB — VITAMIN D 25 HYDROXY (VIT D DEFICIENCY, FRACTURES)

## 2019-07-09 LAB — VITAMIN B1

## 2019-07-09 LAB — FOLATE

## 2019-07-09 LAB — VITAMIN B12

## 2019-07-09 LAB — FERRITIN

## 2019-07-13 ENCOUNTER — Other Ambulatory Visit: Payer: Self-pay | Admitting: Family Medicine

## 2019-07-13 DIAGNOSIS — E559 Vitamin D deficiency, unspecified: Secondary | ICD-10-CM

## 2019-07-13 DIAGNOSIS — D509 Iron deficiency anemia, unspecified: Secondary | ICD-10-CM

## 2019-07-13 DIAGNOSIS — Z8639 Personal history of other endocrine, nutritional and metabolic disease: Secondary | ICD-10-CM

## 2019-07-13 DIAGNOSIS — R5382 Chronic fatigue, unspecified: Secondary | ICD-10-CM

## 2019-07-13 NOTE — Progress Notes (Signed)
Labs originally drawn 11 days ago but were not run. Labs to be redrawn per Dr. Raliegh Scarlet. AS, CMA

## 2019-07-16 ENCOUNTER — Other Ambulatory Visit: Payer: Self-pay

## 2019-07-16 ENCOUNTER — Other Ambulatory Visit: Payer: Medicare PPO

## 2019-07-16 DIAGNOSIS — R5382 Chronic fatigue, unspecified: Secondary | ICD-10-CM | POA: Diagnosis not present

## 2019-07-16 DIAGNOSIS — Z8639 Personal history of other endocrine, nutritional and metabolic disease: Secondary | ICD-10-CM | POA: Diagnosis not present

## 2019-07-16 DIAGNOSIS — D509 Iron deficiency anemia, unspecified: Secondary | ICD-10-CM | POA: Diagnosis not present

## 2019-07-16 DIAGNOSIS — E559 Vitamin D deficiency, unspecified: Secondary | ICD-10-CM | POA: Diagnosis not present

## 2019-07-17 ENCOUNTER — Telehealth: Payer: Self-pay | Admitting: Family Medicine

## 2019-07-17 NOTE — Telephone Encounter (Deleted)
Deborah Petersen, per the prior labs that were ordered that just needed to be reordered, you missed reordering an iron level on her.    Prior there was an "iron and TIBC" level drawn and this last time, the order only included a TIBC and not the iron.   Please call [facility] and make sure this is added on.   Thank you for calling them and getting this ordered as well since pt has Iron Deficiency Anemia and it is an important value for [place].   Dr Raliegh Scarlet

## 2019-07-17 NOTE — Telephone Encounter (Deleted)
Please disregard prior notation/ message.    Ms Deborah Petersen,  Not sure how I missed this, but I apologize for my earlier email/ note requesting you to call [facility] and add on an Iron level for this pt.   For whatever reason, I did not see that I signed off on an iron lab order, however, clearly it was ordered. Thanks and sorry for any confusion.  Dr Jenetta Downer

## 2019-07-17 NOTE — Addendum Note (Signed)
Addended by: Marjory Sneddon on: 07/17/2019 12:48 PM   Modules accepted: Orders

## 2019-07-17 NOTE — Telephone Encounter (Signed)
Entered in error

## 2019-07-23 LAB — VITAMIN B1: Thiamine: 124.9 nmol/L (ref 66.5–200.0)

## 2019-07-23 LAB — IRON AND TIBC
Iron Saturation: 35 % (ref 15–55)
Iron: 108 ug/dL (ref 27–139)
Total Iron Binding Capacity: 308 ug/dL (ref 250–450)
UIBC: 200 ug/dL (ref 118–369)

## 2019-07-23 LAB — VITAMIN D 25 HYDROXY (VIT D DEFICIENCY, FRACTURES): Vit D, 25-Hydroxy: 50.1 ng/mL (ref 30.0–100.0)

## 2019-07-23 LAB — FOLATE: Folate: >20 ng/mL (ref >3.0)

## 2019-07-23 LAB — VITAMIN B12: Vitamin B-12: 244 pg/mL (ref 232–1245)

## 2019-07-23 LAB — FERRITIN: Ferritin: 78 ng/mL (ref 15–150)

## 2019-08-24 ENCOUNTER — Other Ambulatory Visit: Payer: Self-pay | Admitting: Family Medicine

## 2019-08-24 ENCOUNTER — Telehealth: Payer: Self-pay | Admitting: Family Medicine

## 2019-08-24 DIAGNOSIS — E669 Obesity, unspecified: Secondary | ICD-10-CM

## 2019-08-24 NOTE — Telephone Encounter (Signed)
Patient called request referral to wt loss mgt--- forwarding request to med asst & referral coord.  --glh

## 2019-08-24 NOTE — Telephone Encounter (Signed)
Patient is aware referral has been placed. AS, CMA

## 2019-08-24 NOTE — Telephone Encounter (Signed)
Done. Notify pt this referral has been placed

## 2019-08-24 NOTE — Progress Notes (Signed)
Order placed

## 2019-08-24 NOTE — Telephone Encounter (Signed)
Please review and advise. AS, CMA 

## 2019-09-15 ENCOUNTER — Other Ambulatory Visit: Payer: Self-pay

## 2019-09-15 ENCOUNTER — Ambulatory Visit (INDEPENDENT_AMBULATORY_CARE_PROVIDER_SITE_OTHER): Payer: Medicare PPO | Admitting: Family Medicine

## 2019-09-15 ENCOUNTER — Encounter (INDEPENDENT_AMBULATORY_CARE_PROVIDER_SITE_OTHER): Payer: Self-pay | Admitting: Family Medicine

## 2019-09-15 VITALS — BP 134/82 | HR 79 | Temp 97.9°F | Ht 62.0 in | Wt 184.0 lb

## 2019-09-15 DIAGNOSIS — Z6833 Body mass index (BMI) 33.0-33.9, adult: Secondary | ICD-10-CM

## 2019-09-15 DIAGNOSIS — E559 Vitamin D deficiency, unspecified: Secondary | ICD-10-CM

## 2019-09-15 DIAGNOSIS — Z0289 Encounter for other administrative examinations: Secondary | ICD-10-CM

## 2019-09-15 DIAGNOSIS — R5382 Chronic fatigue, unspecified: Secondary | ICD-10-CM

## 2019-09-15 DIAGNOSIS — Z6835 Body mass index (BMI) 35.0-35.9, adult: Secondary | ICD-10-CM

## 2019-09-15 DIAGNOSIS — Z1331 Encounter for screening for depression: Secondary | ICD-10-CM | POA: Diagnosis not present

## 2019-09-15 DIAGNOSIS — E038 Other specified hypothyroidism: Secondary | ICD-10-CM | POA: Diagnosis not present

## 2019-09-15 DIAGNOSIS — K089 Disorder of teeth and supporting structures, unspecified: Secondary | ICD-10-CM

## 2019-09-15 DIAGNOSIS — E669 Obesity, unspecified: Secondary | ICD-10-CM | POA: Diagnosis not present

## 2019-09-16 LAB — T4, FREE: Free T4: 1.36 ng/dL (ref 0.82–1.77)

## 2019-09-16 LAB — CBC WITH DIFFERENTIAL/PLATELET
Basophils Absolute: 0 10*3/uL (ref 0.0–0.2)
Basos: 1 %
EOS (ABSOLUTE): 0.2 10*3/uL (ref 0.0–0.4)
Eos: 4 %
Hematocrit: 40.2 % (ref 34.0–46.6)
Hemoglobin: 13.3 g/dL (ref 11.1–15.9)
Immature Grans (Abs): 0 10*3/uL (ref 0.0–0.1)
Immature Granulocytes: 0 %
Lymphocytes Absolute: 1.2 10*3/uL (ref 0.7–3.1)
Lymphs: 20 %
MCH: 28.2 pg (ref 26.6–33.0)
MCHC: 33.1 g/dL (ref 31.5–35.7)
MCV: 85 fL (ref 79–97)
Monocytes Absolute: 0.5 10*3/uL (ref 0.1–0.9)
Monocytes: 8 %
Neutrophils Absolute: 3.9 10*3/uL (ref 1.4–7.0)
Neutrophils: 67 %
Platelets: 341 10*3/uL (ref 150–450)
RBC: 4.71 x10E6/uL (ref 3.77–5.28)
RDW: 12.9 % (ref 11.7–15.4)
WBC: 5.8 10*3/uL (ref 3.4–10.8)

## 2019-09-16 LAB — INSULIN, RANDOM: INSULIN: 7.2 u[IU]/mL (ref 2.6–24.9)

## 2019-09-16 LAB — COMPREHENSIVE METABOLIC PANEL
ALT: 15 IU/L (ref 0–32)
AST: 21 IU/L (ref 0–40)
Albumin/Globulin Ratio: 1.7 (ref 1.2–2.2)
Albumin: 4.5 g/dL (ref 3.8–4.8)
Alkaline Phosphatase: 124 IU/L — ABNORMAL HIGH (ref 48–121)
BUN/Creatinine Ratio: 17 (ref 12–28)
BUN: 16 mg/dL (ref 8–27)
Bilirubin Total: 0.4 mg/dL (ref 0.0–1.2)
CO2: 26 mmol/L (ref 20–29)
Calcium: 10.2 mg/dL (ref 8.7–10.3)
Chloride: 101 mmol/L (ref 96–106)
Creatinine, Ser: 0.92 mg/dL (ref 0.57–1.00)
GFR calc Af Amer: 75 mL/min/{1.73_m2} (ref >59)
GFR calc non Af Amer: 65 mL/min/{1.73_m2} (ref >59)
Globulin, Total: 2.6 g/dL (ref 1.5–4.5)
Glucose: 84 mg/dL (ref 65–99)
Potassium: 4 mmol/L (ref 3.5–5.2)
Sodium: 141 mmol/L (ref 134–144)
Total Protein: 7.1 g/dL (ref 6.0–8.5)

## 2019-09-16 LAB — HEMOGLOBIN A1C
Est. average glucose Bld gHb Est-mCnc: 105 mg/dL
Hgb A1c MFr Bld: 5.3 % (ref 4.8–5.6)

## 2019-09-16 LAB — VITAMIN D 25 HYDROXY (VIT D DEFICIENCY, FRACTURES): Vit D, 25-Hydroxy: 46.2 ng/mL (ref 30.0–100.0)

## 2019-09-16 LAB — LIPID PANEL WITH LDL/HDL RATIO
Cholesterol, Total: 215 mg/dL — ABNORMAL HIGH (ref 100–199)
HDL: 92 mg/dL (ref >39)
LDL Chol Calc (NIH): 105 mg/dL — ABNORMAL HIGH (ref 0–99)
LDL/HDL Ratio: 1.1 ratio (ref 0.0–3.2)
Triglycerides: 108 mg/dL (ref 0–149)
VLDL Cholesterol Cal: 18 mg/dL (ref 5–40)

## 2019-09-16 LAB — TSH: TSH: 0.337 u[IU]/mL — ABNORMAL LOW (ref 0.450–4.500)

## 2019-09-16 LAB — T3: T3, Total: 137 ng/dL (ref 71–180)

## 2019-09-16 NOTE — Progress Notes (Signed)
Chief Complaint:   OBESITY LOUKYA Petersen (MR# [id number]) is a 69 y.o. female who presents for evaluation and treatment of obesity and related comorbidities. Current BMI is Body mass index is 33.65 kg/m. [name] has been struggling with her weight for many years and has been unsuccessful in either losing weight, maintaining weight loss, or reaching her healthy weight goal.  [name] has a history of gastric bypass surgery in 2001. She has gone from 240 lbs to 150 lbs in 9 months. She states she is not sure if she is ready to do this. [name] does not appear to be in the action stage of change.  [name]'s habits were reviewed today and are as follows: her desired weight loss is 54 lbs, she has been heavy most of her life, she started gaining weight in 1981, medical event, her heaviest weight ever was 240 pounds, she has significant food cravings issues, she snacks frequently in the evenings and she skips meals frequently.  Depression Screen [name]'s Food and Mood (modified PHQ-9) score was 3.  Depression screen [facility] 2/9 09/15/2019  Decreased Interest 0  Down, Depressed, Hopeless 0  PHQ - 2 Score 0  Altered sleeping 0  Tired, decreased energy 0  Change in appetite 0  Feeling bad or failure about yourself  0  Trouble concentrating 3  Moving slowly or fidgety/restless 0  Suicidal thoughts 0  PHQ-9 Score 3  Difficult doing work/chores Not difficult at all  Some recent data might be hidden   Subjective:   1. Chronic fatigue [name] denies daytime somnolence and denies waking up still tired. [name] generally gets 8 hours of sleep per night, and states that she has generally restful sleep. Snoring is not present. Apneic episodes are not present. Epworth Sleepiness Score is 0.  2. Other specified hypothyroidism [name] is currently on levothyroxine 100 mcg q daily.  3. Vitamin D deficiency [name] is currently on OTC Vit D 5,000 IU once daily.  4. Poor dentition [name] recently had  all of her teeth extracted. She has dentures, however she prefers not to wear them. She is able to eat if she cuts her food up into very small pieces.  Assessment/Plan:   1. Chronic fatigue [name] does feel that her weight is causing her energy to be lower than it should be. Fatigue may be related to obesity, depression or many other causes. Labs will be ordered, and in the meanwhile, [name] will focus on self care including making healthy food choices, increasing physical activity and focusing on stress reduction.  - EKG 12-Lead - Comprehensive metabolic panel - CBC with Differential/Platelet - Hemoglobin A1c - Insulin, random - Lipid Panel With LDL/HDL Ratio  2. Other specified hypothyroidism Patient with long-standing hypothyroidism, on levothyroxine therapy. She appears euthyroid. [name] will start her Category 1 meal plan, and we will check labs today. Orders and follow up as documented in patient record.  - TSH - T4, free - T3  3. Vitamin D deficiency Low Vitamin D level contributes to fatigue and are associated with obesity, breast, and colon cancer. We will check labs today, and Km. will follow-up for routine testing of Vitamin D, at least 2-3 times per year to avoid over-replacement.  - VITAMIN D 25 Hydroxy (Vit-D Deficiency, Fractures)  4. Poor dentition I recommended soft foods, and Rabia will start her Category 1 meal plan.  5. Depression screening [name] had a negative depression screening. Depression is commonly associated with obesity and often results in  emotional eating behaviors. We will monitor this closely and work on CBT to help improve the non-hunger eating patterns. Referral to Psychology may be required if no improvement is seen as she continues in our clinic.  6. Class 1 obesity with serious comorbidity and body mass index (BMI) of 33.0 to 33.9 in adult, unspecified obesity type Cathlin is currently in the action stage of change and her goal is to continue  with weight loss efforts. I recommend [name] begin the structured treatment plan as follows:  She has agreed to the Category 1 Plan with additional 100 calorie snack.  Exercise goals: As is.   Behavioral modification strategies: increasing lean protein intake, decreasing simple carbohydrates, increasing water intake, decreasing eating out, no skipping meals and meal planning and cooking strategies.  She was informed of the importance of frequent follow-up visits to maximize her success with intensive lifestyle modifications for her multiple health conditions. She was informed we would discuss her lab results at her next visit unless there is a critical issue that needs to be addressed sooner. [name] agreed to keep her next visit at the agreed upon time to discuss these results.  Objective:   Blood pressure 134/82, pulse 79, temperature 97.9 F (36.6 C), temperature source Oral, height 5\' 2"  (1.575 m), weight 184 lb (83.5 kg), SpO2 98 %. Body mass index is 33.65 kg/m.  EKG: Normal sinus rhythm, rate 74 BPM.  Indirect Calorimeter completed today shows a VO2 of 201 and a REE of 1399.  Her calculated basal metabolic rate is 99991111 thus her basal metabolic rate is worse than expected.  General: Cooperative, alert, well developed, in no acute distress. HEENT: Conjunctivae and lids unremarkable. Cardiovascular: Regular rhythm.  Lungs: Normal work of breathing. Neurologic: No focal deficits.   Lab Results  Component Value Date   CREATININE 0.92 09/15/2019   BUN 16 09/15/2019   NA 141 09/15/2019   K 4.0 09/15/2019   CL 101 09/15/2019   CO2 26 09/15/2019   Lab Results  Component Value Date   ALT 15 09/15/2019   AST 21 09/15/2019   ALKPHOS 124 (H) 09/15/2019   BILITOT 0.4 09/15/2019   Lab Results  Component Value Date   HGBA1C 5.3 09/15/2019   HGBA1C 5.5 01/23/2019   HGBA1C 5.4 07/25/2017   HGBA1C 5.3 10/03/2016   Lab Results  Component Value Date   INSULIN 7.2 09/15/2019    Lab Results  Component Value Date   TSH 0.337 (L) 09/15/2019   Lab Results  Component Value Date   CHOL 215 (H) 09/15/2019   HDL 92 09/15/2019   LDLCALC 105 (H) 09/15/2019   TRIG 108 09/15/2019   CHOLHDL 2.4 01/23/2019   Lab Results  Component Value Date   WBC 5.8 09/15/2019   HGB 13.3 09/15/2019   HCT 40.2 09/15/2019   MCV 85 09/15/2019   PLT 341 09/15/2019   Lab Results  Component Value Date   IRON 108 07/16/2019   TIBC 308 07/16/2019   FERRITIN 78 07/16/2019   Obesity Behavioral Intervention Visit Documentation for Insurance:   Approximately 15 minutes were spent on the discussion below.  ASK: We discussed the diagnosis of obesity with Ivin Booty today and Kalsey agreed to give [place] permission to discuss obesity behavioral modification therapy today.  ASSESS: Maclyn has the diagnosis of obesity and her BMI today is 33.65. Jahnae is not in the action stage of change.   ADVISE: Akshita was educated on the multiple health risks of obesity as well  as the benefit of weight loss to improve her health. She was advised of the need for long term treatment and the importance of lifestyle modifications to improve her current health and to decrease her risk of future health problems.  AGREE: Multiple dietary modification options and treatment options were discussed and Hemma agreed to follow the recommendations documented in the above note.  ARRANGE: Lincy was educated on the importance of frequent visits to treat obesity as outlined per CMS and USPSTF guidelines and agreed to schedule her next follow up appointment today.  Attestation Statements:   Reviewed by clinician on day of visit: allergies, medications, problem list, medical history, surgical history, family history, social history, and previous encounter notes.   I, Erina Giorgianni, am acting as transcriptionist for Dennard Nip, MD.  I have reviewed the above documentation for accuracy and completeness, and I agree  with the above. - Dennard Nip, MD

## 2019-09-24 ENCOUNTER — Other Ambulatory Visit: Payer: Self-pay | Admitting: Family Medicine

## 2019-09-24 DIAGNOSIS — D509 Iron deficiency anemia, unspecified: Secondary | ICD-10-CM

## 2019-09-29 ENCOUNTER — Other Ambulatory Visit: Payer: Self-pay

## 2019-09-29 ENCOUNTER — Ambulatory Visit (INDEPENDENT_AMBULATORY_CARE_PROVIDER_SITE_OTHER): Payer: Medicare PPO | Admitting: Family Medicine

## 2019-09-29 ENCOUNTER — Other Ambulatory Visit: Payer: Self-pay | Admitting: General Practice

## 2019-09-29 ENCOUNTER — Encounter (INDEPENDENT_AMBULATORY_CARE_PROVIDER_SITE_OTHER): Payer: Self-pay | Admitting: Family Medicine

## 2019-09-29 VITALS — BP 122/76 | HR 90 | Temp 99.0°F | Ht 62.0 in | Wt 182.0 lb

## 2019-09-29 DIAGNOSIS — E559 Vitamin D deficiency, unspecified: Secondary | ICD-10-CM

## 2019-09-29 DIAGNOSIS — E669 Obesity, unspecified: Secondary | ICD-10-CM

## 2019-09-29 DIAGNOSIS — E7849 Other hyperlipidemia: Secondary | ICD-10-CM

## 2019-09-29 DIAGNOSIS — Z6833 Body mass index (BMI) 33.0-33.9, adult: Secondary | ICD-10-CM | POA: Diagnosis not present

## 2019-09-29 DIAGNOSIS — E038 Other specified hypothyroidism: Secondary | ICD-10-CM

## 2019-09-29 DIAGNOSIS — D509 Iron deficiency anemia, unspecified: Secondary | ICD-10-CM

## 2019-09-29 MED ORDER — FUSION PLUS PO CAPS: 1.0000 | ORAL_CAPSULE | Freq: Every day | ORAL | 1 refills | Status: DC

## 2019-09-29 NOTE — Progress Notes (Signed)
Chief Complaint:   OBESITY Deborah Petersen is here to discuss her progress with her obesity treatment plan along with follow-up of her obesity related diagnoses. Deborah Petersen is on the Category 1 Plan + 100 calories and states she is following her eating plan approximately 50% of the time. 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  Today's visit was #: 2 Starting weight: 184 lbs Starting date: 09/15/2019 Today's weight: 182 lbs Today's date: 09/29/2019 Total lbs lost to date: 2 Total lbs lost since last in-office visit: 2  Interim History: Deborah Petersen notes over the last two weeks she traveled to [place] and underwent significant dental work. She had all of her teeth removed and she is in the healing stage for the next 3-5 months. She is unable to masticate any foods, and unable to eat hard foods.  Subjective:   1. Other hyperlipidemia Deborah Petersen's lipid panel on 09/15/2019, showed a total cholesterol of 215, triglycerides 108, HDL 92, and LDL 105. She is not on statin therapy. I discussed labs with the patient today.  2. Vitamin D deficiency Deborah Petersen's Vit D level on 09/15/2019 was 46.2. She is on OTC Vit D3 5,000 IU q daily and is tolerating it well. I discussed labs with the patient today.  3. Other specified hypothyroidism Deborah Petersen's thyroid panel was stable on 09/15/2019. She is on levothyroxine 100 mcg q daily. I discussed labs with the patient today.  Assessment/Plan:   1. Other hyperlipidemia Cardiovascular risk and specific lipid/LDL goals reviewed. We discussed several lifestyle modifications today. Knya will continue her journaling meal plan, and will continue to work on exercise and weight loss efforts. We will recheck labs in 3 months. Orders and follow up as documented in patient record.   2. Vitamin D deficiency Low Vitamin D level contributes to fatigue and are associated with obesity, breast, and colon cancer. Deborah Petersen agrees to continue OTC Vit D supplementation and will  follow-up for routine testing of Vitamin D, at least 2-3 times per year to avoid over-replacement. We will recheck labs in 3 months.  3. Other specified hypothyroidism Patient with long-standing hypothyroidism. Deborah Petersen will continue her current levothyroxine dose. She appears euthyroid. We will monitor labs. Orders and follow up as documented in patient record.  4. Class 1 obesity with serious comorbidity and body mass index (BMI) of 33.0 to 33.9 in adult, unspecified obesity type Deborah Petersen is currently in the action stage of change. As such, her goal is to continue with weight loss efforts. She has agreed to change to keeping a food journal and adhering to recommended goals of 1100-1200 calories and 75+ grams of protein daily.   Handout provided today: Additional Breakfast Options.  Exercise goals: As is.  Behavioral modification strategies: increasing lean protein intake, decreasing simple carbohydrates, increasing water intake, meal planning and cooking strategies and better snacking choices.  Deborah Petersen has agreed to follow-up with our clinic in 3 weeks. She was informed of the importance of frequent follow-up visits to maximize her success with intensive lifestyle modifications for her multiple health conditions.   Objective:   Blood pressure 122/76, pulse 90, temperature 99 F (37.2 C), temperature source Oral, height 5\' 2"  (1.575 m), weight 182 lb (82.6 kg), SpO2 94 %. Body mass index is 33.29 kg/m.  General: Cooperative, alert, well developed, in no acute distress. HEENT: Conjunctivae and lids unremarkable. Cardiovascular: Regular rhythm.  Lungs: Normal work of breathing. Neurologic: No focal deficits.   Lab Results  Component Value Date   CREATININE  0.92 09/15/2019   BUN 16 09/15/2019   NA 141 09/15/2019   K 4.0 09/15/2019   CL 101 09/15/2019   CO2 26 09/15/2019   Lab Results  Component Value Date   ALT 15 09/15/2019   AST 21 09/15/2019   ALKPHOS 124 (H) 09/15/2019    BILITOT 0.4 09/15/2019   Lab Results  Component Value Date   HGBA1C 5.3 09/15/2019   HGBA1C 5.5 01/23/2019   HGBA1C 5.4 07/25/2017   HGBA1C 5.3 10/03/2016   Lab Results  Component Value Date   INSULIN 7.2 09/15/2019   Lab Results  Component Value Date   TSH 0.337 (L) 09/15/2019   Lab Results  Component Value Date   CHOL 215 (H) 09/15/2019   HDL 92 09/15/2019   LDLCALC 105 (H) 09/15/2019   TRIG 108 09/15/2019   CHOLHDL 2.4 01/23/2019   Lab Results  Component Value Date   WBC 5.8 09/15/2019   HGB 13.3 09/15/2019   HCT 40.2 09/15/2019   MCV 85 09/15/2019   PLT 341 09/15/2019   Lab Results  Component Value Date   IRON 108 07/16/2019   TIBC 308 07/16/2019   FERRITIN 78 07/16/2019   Attestation Statements:   Reviewed by clinician on day of visit: allergies, medications, problem list, medical history, surgical history, family history, social history, and previous encounter notes.  Time spent on visit including pre-visit chart review and post-visit care and charting was 42 minutes.    I, Janille Shahid, am acting as transcriptionist for Dennard Nip, MD.  I have reviewed the above documentation for accuracy and completeness, and I agree with the above. -  Dennard Nip, MD

## 2019-09-29 NOTE — Telephone Encounter (Signed)
Patient called requested refill of :   Iron-FA-B Cmp-C-Biot-Probiotic (FUSION PLUS) CAPS [id number]   Order Details Dose: 1 capsule Route: Oral Frequency: Daily  Dispense Quantity: 90 capsule Refills: 3   Indications of Use: gets from Dr Benson [place] of [facility]      Sig: Take 1 capsule by mouth daily.   Pt uses :  Preferred Pharmacies      [facility] [phone] [address], [address] AT [address] [phone] (Phone) [phone] (Fax)   Pls call pt if there are any questions.  --glh

## 2019-10-20 ENCOUNTER — Ambulatory Visit (INDEPENDENT_AMBULATORY_CARE_PROVIDER_SITE_OTHER): Payer: Medicare PPO | Admitting: Family Medicine

## 2019-10-20 ENCOUNTER — Encounter (INDEPENDENT_AMBULATORY_CARE_PROVIDER_SITE_OTHER): Payer: Self-pay | Admitting: Family Medicine

## 2019-10-20 ENCOUNTER — Other Ambulatory Visit: Payer: Self-pay

## 2019-10-20 VITALS — BP 129/81 | HR 83 | Temp 98.1°F | Ht 62.0 in | Wt 184.0 lb

## 2019-10-20 DIAGNOSIS — Z6833 Body mass index (BMI) 33.0-33.9, adult: Secondary | ICD-10-CM | POA: Diagnosis not present

## 2019-10-20 DIAGNOSIS — F3289 Other specified depressive episodes: Secondary | ICD-10-CM

## 2019-10-20 DIAGNOSIS — E669 Obesity, unspecified: Secondary | ICD-10-CM | POA: Diagnosis not present

## 2019-10-21 NOTE — Progress Notes (Signed)
Chief Complaint:   OBESITY Deborah Petersen is here to discuss her progress with her obesity treatment plan along with follow-up of her obesity related diagnoses. Deborah Petersen is on keeping a food journal and adhering to recommended goals of 1100-1200 calories and 75+ grams of protein daily and states she is following her eating plan approximately 0% of the time. Deborah Petersen states she has increased her walking.  Today's visit was #: 3 Starting weight: 184 lbs Starting date: 09/15/2019 Today's weight: 184 lbs Today's date: 10/20/2019 Total lbs lost to date: 0 Total lbs lost since last in-office visit: 0  Interim History: Deborah Petersen went on vacation and then came back to a funeral so she hasn't been able to try journaling yet. She is retaining a bit of fluid today and has actually done well maintaining her weight loss. She is status post gastric bypass.  Subjective:   1. Other depression, with emotional eating  Deborah Petersen notes she is struggling with cravings, especially worse in the PM. Her blood pressure is stable and she denies a history of seizure disorder.  Assessment/Plan:   1. Other depression, with emotional eating  Behavior modification techniques were discussed today to help Deborah Petersen deal with her emotional/non-hunger eating behaviors. Deborah Petersen agreed to start Wellbutrin SR 150 mg daily with no refills. Orders and follow up as documented in patient record.   - buPROPion (WELLBUTRIN SR) 150 MG 12 hr tablet; Take 1 tablet (150 mg total) by mouth daily.  Dispense: 30 tablet; Refill: 0  2. Class 1 obesity with serious comorbidity and body mass index (BMI) of 33.0 to 33.9 in adult, unspecified obesity type Deborah Petersen is currently in the action stage of change. As such, her goal is to continue with weight loss efforts. She has agreed to keeping a food journal and adhering to recommended goals of 1100-1200 calories and 75+ grams of protein daily.   Exercise goals: As is.  Behavioral modification strategies:  increasing lean protein intake and emotional eating strategies.  Deborah Petersen has agreed to follow-up with our clinic in 3 weeks. She was informed of the importance of frequent follow-up visits to maximize her success with intensive lifestyle modifications for her multiple health conditions.   Objective:   Blood pressure 129/81, pulse 83, temperature 98.1 F (36.7 C), temperature source Oral, height 5\' 2"  (1.575 m), weight 184 lb (83.5 kg), SpO2 96 %. Body mass index is 33.65 kg/m.  General: Cooperative, alert, well developed, in no acute distress. HEENT: Conjunctivae and lids unremarkable. Cardiovascular: Regular rhythm.  Lungs: Normal work of breathing. Neurologic: No focal deficits.   Lab Results  Component Value Date   CREATININE 0.92 09/15/2019   BUN 16 09/15/2019   NA 141 09/15/2019   K 4.0 09/15/2019   CL 101 09/15/2019   CO2 26 09/15/2019   Lab Results  Component Value Date   ALT 15 09/15/2019   AST 21 09/15/2019   ALKPHOS 124 (H) 09/15/2019   BILITOT 0.4 09/15/2019   Lab Results  Component Value Date   HGBA1C 5.3 09/15/2019   HGBA1C 5.5 01/23/2019   HGBA1C 5.4 07/25/2017   HGBA1C 5.3 10/03/2016   Lab Results  Component Value Date   INSULIN 7.2 09/15/2019   Lab Results  Component Value Date   TSH 0.337 (L) 09/15/2019   Lab Results  Component Value Date   CHOL 215 (H) 09/15/2019   HDL 92 09/15/2019   LDLCALC 105 (H) 09/15/2019   TRIG 108 09/15/2019   CHOLHDL 2.4 01/23/2019  Lab Results  Component Value Date   WBC 5.8 09/15/2019   HGB 13.3 09/15/2019   HCT 40.2 09/15/2019   MCV 85 09/15/2019   PLT 341 09/15/2019   Lab Results  Component Value Date   IRON 108 07/16/2019   TIBC 308 07/16/2019   FERRITIN 78 07/16/2019   Attestation Statements:   Reviewed by clinician on day of visit: allergies, medications, problem list, medical history, surgical history, family history, social history, and previous encounter notes.  Time spent on visit  including pre-visit chart review and post-visit care and charting was 30 minutes.    I, Xoey Warmoth, am acting as transcriptionist for Dennard Nip, MD.  I have reviewed the above documentation for accuracy and completeness, and I agree with the above. -  Dennard Nip, MD

## 2019-10-22 ENCOUNTER — Encounter (INDEPENDENT_AMBULATORY_CARE_PROVIDER_SITE_OTHER): Payer: Self-pay | Admitting: Family Medicine

## 2019-10-22 MED ORDER — BUPROPION HCL ER (SR) 150 MG PO TB12: 150.0000 mg | ORAL_TABLET | Freq: Every day | ORAL | 0 refills | Status: DC

## 2019-11-11 ENCOUNTER — Encounter (INDEPENDENT_AMBULATORY_CARE_PROVIDER_SITE_OTHER): Payer: Self-pay | Admitting: Family Medicine

## 2019-11-11 ENCOUNTER — Ambulatory Visit (INDEPENDENT_AMBULATORY_CARE_PROVIDER_SITE_OTHER): Payer: Medicare PPO | Admitting: Family Medicine

## 2019-11-11 ENCOUNTER — Other Ambulatory Visit: Payer: Self-pay

## 2019-11-11 VITALS — BP 108/74 | HR 75 | Temp 98.5°F | Ht 62.0 in | Wt 178.0 lb

## 2019-11-11 DIAGNOSIS — Z6832 Body mass index (BMI) 32.0-32.9, adult: Secondary | ICD-10-CM | POA: Diagnosis not present

## 2019-11-11 DIAGNOSIS — F3289 Other specified depressive episodes: Secondary | ICD-10-CM | POA: Diagnosis not present

## 2019-11-11 DIAGNOSIS — E669 Obesity, unspecified: Secondary | ICD-10-CM

## 2019-11-11 MED ORDER — BUPROPION HCL ER (SR) 150 MG PO TB12: 150.0000 mg | ORAL_TABLET | Freq: Every day | ORAL | 0 refills | Status: DC

## 2019-11-11 MED ORDER — ESCITALOPRAM OXALATE 10 MG PO TABS: 10.0000 mg | ORAL_TABLET | Freq: Every day | ORAL | 0 refills | Status: DC

## 2019-11-12 NOTE — Progress Notes (Signed)
Chief Complaint:   OBESITY Deborah Petersen is here to discuss her progress with her obesity treatment plan along with follow-up of her obesity related diagnoses. Deborah Petersen is on keeping a food journal and adhering to recommended goals of [id number] calories and 75+ grams of protein daily and states she is following her eating plan approximately 0% of the time. 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  Today's visit was #: 4 Starting weight: 184 lbs Starting date: 09/15/2019 Today's weight: 178 lbs Today's date: 11/11/2019 Total lbs lost to date: 6 Total lbs lost since last in-office visit: 6  Interim History: Deborah Petersen has done  Well with weight loss but she struggles to eat all of her protein while waiting for her dental implaints.  Subjective:   1. Other depression, with emotional eating  Deborah Petersen struggles with decreased mood and decreased energy, and she is feeling more tearful at times. She has a lot of stress and she wonders if therapy could help.  Assessment/Plan:   1. Other depression, with emotional eating  Behavior modification techniques were discussed today to help Deborah Petersen deal with her emotional/non-hunger eating behaviors. Deborah Petersen agreed to start Lexapro 10 mg q AM and we will refill Wellbutrin SR for 1 month. We will refer to [facility] for evaluation. Orders and follow up as documented in patient record.   - Ambulatory referral to Psychology - buPROPion (WELLBUTRIN SR) 150 MG 12 hr tablet; Take 1 tablet (150 mg total) by mouth daily.  Dispense: 30 tablet; Refill: 0 - escitalopram (LEXAPRO) 10 MG tablet; Take 1 tablet (10 mg total) by mouth daily.  Dispense: 30 tablet; Refill: 0  2. Class 1 obesity with serious comorbidity and body mass index (BMI) of 32.0 to 32.9 in adult, unspecified obesity type Deborah Petersen is currently in the action stage of change. As such, her goal is to continue with weight loss efforts. She has agreed to keeping a food journal and adhering to  recommended goals of 1100-1200 calories and 75+ grams of protein daily.   Exercise goals: As is.  Behavioral modification strategies: increasing water intake and meal planning and cooking strategies.  Deborah Petersen has agreed to follow-up with our clinic in 3 to 4 weeks. She was informed of the importance of frequent follow-up visits to maximize her success with intensive lifestyle modifications for her multiple health conditions.   Objective:   Blood pressure 108/74, pulse 75, temperature 98.5 F (36.9 C), temperature source Oral, height 5\' 2"  (1.575 m), weight 178 lb (80.7 kg), SpO2 99 %. Body mass index is 32.56 kg/m.  General: Cooperative, alert, well developed, in no acute distress. HEENT: Conjunctivae and lids unremarkable. Cardiovascular: Regular rhythm.  Lungs: Normal work of breathing. Neurologic: No focal deficits.   Lab Results  Component Value Date   CREATININE 0.92 09/15/2019   BUN 16 09/15/2019   NA 141 09/15/2019   K 4.0 09/15/2019   CL 101 09/15/2019   CO2 26 09/15/2019   Lab Results  Component Value Date   ALT 15 09/15/2019   AST 21 09/15/2019   ALKPHOS 124 (H) 09/15/2019   BILITOT 0.4 09/15/2019   Lab Results  Component Value Date   HGBA1C 5.3 09/15/2019   HGBA1C 5.5 01/23/2019   HGBA1C 5.4 07/25/2017   HGBA1C 5.3 10/03/2016   Lab Results  Component Value Date   INSULIN 7.2 09/15/2019   Lab Results  Component Value Date   TSH 0.337 (L) 09/15/2019   Lab Results  Component Value Date  CHOL 215 (H) 09/15/2019   HDL 92 09/15/2019   LDLCALC 105 (H) 09/15/2019   TRIG 108 09/15/2019   CHOLHDL 2.4 01/23/2019   Lab Results  Component Value Date   WBC 5.8 09/15/2019   HGB 13.3 09/15/2019   HCT 40.2 09/15/2019   MCV 85 09/15/2019   PLT 341 09/15/2019   Lab Results  Component Value Date   IRON 108 07/16/2019   TIBC 308 07/16/2019   FERRITIN 78 07/16/2019   Attestation Statements:   Reviewed by clinician on day of visit: allergies,  medications, problem list, medical history, surgical history, family history, social history, and previous encounter notes.  Time spent on visit including pre-visit chart review and post-visit care and charting was 33 minutes.    I, Chey Rachels, am acting as transcriptionist for Dennard Nip, MD.  I have reviewed the above documentation for accuracy and completeness, and I agree with the above. -  I have reviewed the above documentation for accuracy and completeness, and I agree with the above. -Dennard Nip, MD

## 2019-12-09 ENCOUNTER — Other Ambulatory Visit (INDEPENDENT_AMBULATORY_CARE_PROVIDER_SITE_OTHER): Payer: Self-pay | Admitting: Family Medicine

## 2019-12-09 DIAGNOSIS — F3289 Other specified depressive episodes: Secondary | ICD-10-CM

## 2019-12-15 ENCOUNTER — Ambulatory Visit (INDEPENDENT_AMBULATORY_CARE_PROVIDER_SITE_OTHER): Payer: Medicare PPO | Admitting: Adult Health

## 2019-12-15 ENCOUNTER — Encounter (INDEPENDENT_AMBULATORY_CARE_PROVIDER_SITE_OTHER): Payer: Self-pay | Admitting: Adult Health

## 2019-12-15 ENCOUNTER — Other Ambulatory Visit: Payer: Self-pay

## 2019-12-15 VITALS — BP 123/79 | HR 72 | Temp 98.2°F | Ht 62.0 in | Wt 177.0 lb

## 2019-12-15 DIAGNOSIS — E669 Obesity, unspecified: Secondary | ICD-10-CM | POA: Diagnosis not present

## 2019-12-15 DIAGNOSIS — I10 Essential (primary) hypertension: Secondary | ICD-10-CM

## 2019-12-15 DIAGNOSIS — Z6832 Body mass index (BMI) 32.0-32.9, adult: Secondary | ICD-10-CM

## 2019-12-15 DIAGNOSIS — F3289 Other specified depressive episodes: Secondary | ICD-10-CM | POA: Diagnosis not present

## 2019-12-15 DIAGNOSIS — H16223 Keratoconjunctivitis sicca, not specified as Sjogren's, bilateral: Secondary | ICD-10-CM | POA: Diagnosis not present

## 2019-12-16 NOTE — Progress Notes (Signed)
Chief Complaint:   OBESITY Deborah Petersen is here to discuss her progress with her obesity treatment plan along with follow-up of her obesity related diagnoses. Jeslyn is keeping a food journal and adhering to recommended goals of 1100-1200 calories and 75+ grams of protein and states she is following her eating plan approximately 0% of the time. Johnella states she is working.   Today's visit was #: 5 Starting weight: 184 lbs Starting date: 09/15/2019 Today's weight: 177 lbs Today's date: 12/15/2019 Total lbs lost to date: 7 Total lbs lost since last in-office visit: 1  Interim History: Deborah Petersen recently returned from 3 weeks of traveling - she visited [place], [place], [place], and [place]. She followed more of a [facility]/[place] plan with an emphasis on increased protein intake. Thursday she will have upper bone grafts; bottom bone grafts are already in place.  She continues to focus on foods that she can masticate with her current dental condition.   Subjective:   Essential hypertension. Blood pressure at goal at today's office visit. She is on chlorthalidone 25 mg daily. She denies cardiac symptoms and denies tobacco/vape use.  BP Readings from Last 3 Encounters:  12/15/19 123/79  11/11/19 108/74  10/20/19 129/81   Lab Results  Component Value Date   CREATININE 0.92 09/15/2019   CREATININE 0.81 01/23/2019   CREATININE 0.93 12/08/2018   Other depression. Deborah Petersen is struggling with emotional eating and using food for comfort to the extent that it is negatively impacting her health. She has been working on behavior modification techniques to help reduce her emotional eating and has been somewhat successful. She shows no sign of suicidal or homicidal ideations. Deborah Petersen is on bupropion SR 150 mg daily and escitalopram 10 mg daily was recently started. She reports improved mood. She was referred to Behavioral Health/Psychology and had her initial appointment scheduled for 12/17/2019, however,  will need to reschedule due to an unforeseen funeral.   Assessment/Plan:   Essential hypertension. Ether is working on healthy weight loss and exercise to improve blood pressure control. We will watch for signs of hypotension as she continues her lifestyle modifications. She will continue chlorthalidone as directed.   Other depression. Behavior modification techniques were discussed today to help Deborah Petersen deal with her emotional/non-hunger eating behaviors.  Orders and follow up as documented in patient record. She will continue her medications as directed and will reschedule her Behavioral Health/Psychology appointment.  Class 1 obesity with serious comorbidity and body mass index (BMI) of 32.0 to 32.9 in adult, unspecified obesity type.  Deborah Petersen is currently in the action stage of change. As such, her goal is to continue with weight loss efforts. She has agreed to practicing portion control and making smarter food choices, such as increasing vegetables and decreasing simple carbohydrates with 75+ grams of protein daily.   Exercise goals: As is- remain as active as tolerated.  Behavioral modification strategies: increasing lean protein intake, increasing water intake, meal planning and cooking strategies and planning for success.  Deborah Petersen has agreed to follow-up with our clinic fasting in 3 weeks. She was informed of the importance of frequent follow-up visits to maximize her success with intensive lifestyle modifications for her multiple health conditions.   Objective:   Blood pressure 123/79, pulse 72, temperature 98.2 F (36.8 C), temperature source Oral, height 5\' 2"  (1.575 m), weight 177 lb (80.3 kg), SpO2 94 %. Body mass index is 32.37 kg/m.  General: Cooperative, alert, well developed, in no acute distress. HEENT: Conjunctivae and  lids unremarkable. Cardiovascular: Regular rhythm.  Lungs: Normal work of breathing. Neurologic: No focal deficits.   Lab Results  Component Value Date    CREATININE 0.92 09/15/2019   BUN 16 09/15/2019   NA 141 09/15/2019   K 4.0 09/15/2019   CL 101 09/15/2019   CO2 26 09/15/2019   Lab Results  Component Value Date   ALT 15 09/15/2019   AST 21 09/15/2019   ALKPHOS 124 (H) 09/15/2019   BILITOT 0.4 09/15/2019   Lab Results  Component Value Date   HGBA1C 5.3 09/15/2019   HGBA1C 5.5 01/23/2019   HGBA1C 5.4 07/25/2017   HGBA1C 5.3 10/03/2016   Lab Results  Component Value Date   INSULIN 7.2 09/15/2019   Lab Results  Component Value Date   TSH 0.337 (L) 09/15/2019   Lab Results  Component Value Date   CHOL 215 (H) 09/15/2019   HDL 92 09/15/2019   LDLCALC 105 (H) 09/15/2019   TRIG 108 09/15/2019   CHOLHDL 2.4 01/23/2019   Lab Results  Component Value Date   WBC 5.8 09/15/2019   HGB 13.3 09/15/2019   HCT 40.2 09/15/2019   MCV 85 09/15/2019   PLT 341 09/15/2019   Lab Results  Component Value Date   IRON 108 07/16/2019   TIBC 308 07/16/2019   FERRITIN 78 07/16/2019   Attestation Statements:   Reviewed by clinician on day of visit: allergies, medications, problem list, medical history, surgical history, family history, social history, and previous encounter notes.  Time spent on visit including pre-visit chart review and post-visit charting and care was 25 minutes.   I, Michaelene Song, am acting as Location manager for [employer], NP-C   I have reviewed the above documentation for accuracy and completeness, and I agree with the above. -  Deborah Grandchild, NP

## 2019-12-17 ENCOUNTER — Ambulatory Visit: Payer: Self-pay | Admitting: Psychologist

## 2019-12-17 DIAGNOSIS — E669 Obesity, unspecified: Secondary | ICD-10-CM | POA: Insufficient documentation

## 2019-12-17 DIAGNOSIS — F32A Depression, unspecified: Secondary | ICD-10-CM | POA: Insufficient documentation

## 2019-12-17 DIAGNOSIS — F329 Major depressive disorder, single episode, unspecified: Secondary | ICD-10-CM | POA: Insufficient documentation

## 2019-12-17 DIAGNOSIS — Z6832 Body mass index (BMI) 32.0-32.9, adult: Secondary | ICD-10-CM | POA: Insufficient documentation

## 2019-12-18 DIAGNOSIS — Z811 Family history of alcohol abuse and dependence: Secondary | ICD-10-CM | POA: Diagnosis not present

## 2019-12-18 DIAGNOSIS — Z87891 Personal history of nicotine dependence: Secondary | ICD-10-CM | POA: Diagnosis not present

## 2019-12-18 DIAGNOSIS — Z6832 Body mass index (BMI) 32.0-32.9, adult: Secondary | ICD-10-CM | POA: Diagnosis not present

## 2019-12-18 DIAGNOSIS — E039 Hypothyroidism, unspecified: Secondary | ICD-10-CM | POA: Diagnosis not present

## 2019-12-18 DIAGNOSIS — Z7982 Long term (current) use of aspirin: Secondary | ICD-10-CM | POA: Diagnosis not present

## 2019-12-18 DIAGNOSIS — F325 Major depressive disorder, single episode, in full remission: Secondary | ICD-10-CM | POA: Diagnosis not present

## 2019-12-18 DIAGNOSIS — I10 Essential (primary) hypertension: Secondary | ICD-10-CM | POA: Diagnosis not present

## 2019-12-18 DIAGNOSIS — E669 Obesity, unspecified: Secondary | ICD-10-CM | POA: Diagnosis not present

## 2019-12-31 ENCOUNTER — Ambulatory Visit (INDEPENDENT_AMBULATORY_CARE_PROVIDER_SITE_OTHER): Payer: Medicare PPO | Admitting: Psychologist

## 2019-12-31 DIAGNOSIS — E669 Obesity, unspecified: Secondary | ICD-10-CM | POA: Diagnosis not present

## 2019-12-31 DIAGNOSIS — F320 Major depressive disorder, single episode, mild: Secondary | ICD-10-CM | POA: Diagnosis not present

## 2020-01-06 ENCOUNTER — Ambulatory Visit (INDEPENDENT_AMBULATORY_CARE_PROVIDER_SITE_OTHER): Payer: Medicare PPO | Admitting: Adult Health

## 2020-01-08 ENCOUNTER — Other Ambulatory Visit (INDEPENDENT_AMBULATORY_CARE_PROVIDER_SITE_OTHER): Payer: Self-pay | Admitting: Family Medicine

## 2020-01-08 DIAGNOSIS — F3289 Other specified depressive episodes: Secondary | ICD-10-CM

## 2020-01-11 ENCOUNTER — Emergency Department (HOSPITAL_BASED_OUTPATIENT_CLINIC_OR_DEPARTMENT_OTHER)
Admission: EM | Admit: 2020-01-11 | Discharge: 2020-01-11 | Disposition: A | Discharge status: Home / Self Care | Payer: Medicare PPO | Attending: Emergency Medicine | Admitting: Emergency Medicine

## 2020-01-11 ENCOUNTER — Other Ambulatory Visit: Payer: Self-pay

## 2020-01-11 ENCOUNTER — Encounter (HOSPITAL_BASED_OUTPATIENT_CLINIC_OR_DEPARTMENT_OTHER): Payer: Self-pay

## 2020-01-11 ENCOUNTER — Emergency Department (HOSPITAL_BASED_OUTPATIENT_CLINIC_OR_DEPARTMENT_OTHER): Payer: Medicare PPO

## 2020-01-11 DIAGNOSIS — I10 Essential (primary) hypertension: Secondary | ICD-10-CM | POA: Insufficient documentation

## 2020-01-11 DIAGNOSIS — Z79890 Hormone replacement therapy (postmenopausal): Secondary | ICD-10-CM | POA: Diagnosis not present

## 2020-01-11 DIAGNOSIS — F909 Attention-deficit hyperactivity disorder, unspecified type: Secondary | ICD-10-CM | POA: Diagnosis not present

## 2020-01-11 DIAGNOSIS — R1084 Generalized abdominal pain: Secondary | ICD-10-CM | POA: Diagnosis not present

## 2020-01-11 DIAGNOSIS — Z79899 Other long term (current) drug therapy: Secondary | ICD-10-CM | POA: Diagnosis not present

## 2020-01-11 DIAGNOSIS — E039 Hypothyroidism, unspecified: Secondary | ICD-10-CM | POA: Diagnosis not present

## 2020-01-11 DIAGNOSIS — Z7982 Long term (current) use of aspirin: Secondary | ICD-10-CM | POA: Insufficient documentation

## 2020-01-11 DIAGNOSIS — R112 Nausea with vomiting, unspecified: Secondary | ICD-10-CM | POA: Diagnosis not present

## 2020-01-11 DIAGNOSIS — K2900 Acute gastritis without bleeding: Secondary | ICD-10-CM | POA: Diagnosis not present

## 2020-01-11 DIAGNOSIS — K2971 Gastritis, unspecified, with bleeding: Secondary | ICD-10-CM | POA: Diagnosis not present

## 2020-01-11 DIAGNOSIS — R109 Unspecified abdominal pain: Secondary | ICD-10-CM | POA: Diagnosis not present

## 2020-01-11 DIAGNOSIS — Z87891 Personal history of nicotine dependence: Secondary | ICD-10-CM | POA: Diagnosis not present

## 2020-01-11 LAB — URINALYSIS, MICROSCOPIC (REFLEX)

## 2020-01-11 LAB — CBC
HCT: 45.1 % (ref 36.0–46.0)
Hemoglobin: 14.7 g/dL (ref 12.0–15.0)
MCH: 27.3 pg (ref 26.0–34.0)
MCHC: 32.6 g/dL (ref 30.0–36.0)
MCV: 83.7 fL (ref 80.0–100.0)
Platelets: 292 10*3/uL (ref 150–400)
RBC: 5.39 MIL/uL — ABNORMAL HIGH (ref 3.87–5.11)
RDW: 13.2 % (ref 11.5–15.5)
WBC: 12.5 10*3/uL — ABNORMAL HIGH (ref 4.0–10.5)
nRBC: 0 % (ref 0.0–0.2)

## 2020-01-11 LAB — URINALYSIS, ROUTINE W REFLEX MICROSCOPIC
Bilirubin Urine: NEGATIVE
Glucose, UA: NEGATIVE mg/dL
Ketones, ur: NEGATIVE mg/dL
Nitrite: NEGATIVE
Protein, ur: NEGATIVE mg/dL
Specific Gravity, Urine: 1.01 (ref 1.005–1.030)
pH: 7.5 (ref 5.0–8.0)

## 2020-01-11 LAB — COMPREHENSIVE METABOLIC PANEL
ALT: 20 U/L (ref 0–44)
AST: 26 U/L (ref 15–41)
Albumin: 4.5 g/dL (ref 3.5–5.0)
Alkaline Phosphatase: 92 U/L (ref 38–126)
Anion gap: 14 (ref 5–15)
BUN: 18 mg/dL (ref 8–23)
CO2: 25 mmol/L (ref 22–32)
Calcium: 10.3 mg/dL (ref 8.9–10.3)
Chloride: 94 mmol/L — ABNORMAL LOW (ref 98–111)
Creatinine, Ser: 1.01 mg/dL — ABNORMAL HIGH (ref 0.44–1.00)
GFR calc Af Amer: >60 mL/min (ref >60)
GFR calc non Af Amer: 58 mL/min — ABNORMAL LOW (ref >60)
Glucose, Bld: 170 mg/dL — ABNORMAL HIGH (ref 70–99)
Potassium: 4.2 mmol/L (ref 3.5–5.1)
Sodium: 133 mmol/L — ABNORMAL LOW (ref 135–145)
Total Bilirubin: 0.6 mg/dL (ref 0.3–1.2)
Total Protein: 8 g/dL (ref 6.5–8.1)

## 2020-01-11 LAB — LIPASE, BLOOD: Lipase: 43 U/L (ref 11–51)

## 2020-01-11 MED ORDER — SODIUM CHLORIDE 0.9 % IV BOLUS
1000.0000 mL | Freq: Once | INTRAVENOUS | Status: AC
  Administered 2020-01-11: 1000 mL via INTRAVENOUS

## 2020-01-11 MED ORDER — ONDANSETRON HCL 4 MG/2ML IJ SOLN
4.0000 mg | Freq: Once | INTRAMUSCULAR | Status: AC
  Administered 2020-01-11: 4 mg via INTRAVENOUS
  Filled 2020-01-11: qty 2

## 2020-01-11 MED ORDER — OMEPRAZOLE 20 MG PO CPDR: 20.0000 mg | DELAYED_RELEASE_CAPSULE | Freq: Every day | ORAL | 0 refills | Status: DC

## 2020-01-11 MED ORDER — HYDROMORPHONE HCL 1 MG/ML IJ SOLN
0.5000 mg | Freq: Once | INTRAMUSCULAR | Status: AC
  Administered 2020-01-11: 0.5 mg via INTRAVENOUS
  Filled 2020-01-11: qty 1

## 2020-01-11 MED ORDER — DICYCLOMINE HCL 20 MG PO TABS: 20.0000 mg | ORAL_TABLET | Freq: Two times a day (BID) | ORAL | 0 refills | Status: DC

## 2020-01-11 MED ORDER — DICYCLOMINE HCL 10 MG PO CAPS
10.0000 mg | ORAL_CAPSULE | Freq: Once | ORAL | Status: AC
  Administered 2020-01-11: 10 mg via ORAL
  Filled 2020-01-11: qty 1

## 2020-01-11 MED ORDER — FAMOTIDINE 20 MG PO TABS: 20.0000 mg | ORAL_TABLET | Freq: Two times a day (BID) | ORAL | 0 refills | Status: DC

## 2020-01-11 MED ORDER — ONDANSETRON 4 MG PO TBDP: 4.0000 mg | ORAL_TABLET | Freq: Three times a day (TID) | ORAL | 0 refills | Status: DC | PRN

## 2020-01-11 MED ORDER — IOHEXOL 300 MG/ML  SOLN
100.0000 mL | Freq: Once | INTRAMUSCULAR | Status: AC | PRN
  Administered 2020-01-11: 100 mL via INTRAVENOUS

## 2020-01-11 NOTE — Discharge Instructions (Addendum)
Please read and follow all provided instructions.  Your diagnoses today include:  1. Generalized abdominal pain   2. Non-intractable vomiting with nausea, unspecified vomiting type     Tests performed today include:  Blood counts and electrolytes - shows high white blood cells  Blood tests to check liver and kidney function  Blood tests to check pancreas function  Urine test to look for infection  CT scan of your abdomen - shows some swollen intestines but no severe infection or bowel blockage  Vital signs. See below for your results today.   Medications prescribed:  Omeprazole (Prilosec) - stomach acid reducer  This medication can be found over-the-counter   Pepcid (famotidine) - antihistamine  You can find this medication over-the-counter.   DO NOT exceed:   20mg  Pepcid every 12 hours   Bentyl - medication for intestinal cramps and spasms   Zofran (ondansetron) - for nausea and vomiting  Take any prescribed medications only as directed.  Home care instructions:   Follow any educational materials contained in this packet.  Follow-up instructions: Please follow-up with your primary care provider in the next 2 days for further evaluation of your symptoms.    Return instructions:  SEEK IMMEDIATE MEDICAL ATTENTION IF:  The pain does not go away or becomes severe   A temperature above 101F develops   Repeated vomiting occurs (multiple episodes)   The pain becomes localized to portions of the abdomen. The right side could possibly be appendicitis. In an adult, the left lower portion of the abdomen could be colitis or diverticulitis.   Blood is being passed in stools or vomit (bright red or black tarry stools)   You develop chest pain, difficulty breathing, dizziness or fainting, or become confused, poorly responsive, or inconsolable (young children)  If you have any other emergent concerns regarding your health  Additional Information: Abdominal (belly)  pain can be caused by many things. Your caregiver performed an examination and possibly ordered blood/urine tests and imaging (CT scan, x-rays, ultrasound). Many cases can be observed and treated at home after initial evaluation in the emergency department. Even though you are being discharged home, abdominal pain can be unpredictable. Therefore, you need a repeated exam if your pain does not resolve, returns, or worsens. Most patients with abdominal pain don't have to be admitted to the hospital or have surgery, but serious problems like appendicitis and gallbladder attacks can start out as nonspecific pain. Many abdominal conditions cannot be diagnosed in one visit, so follow-up evaluations are very important.  Your vital signs today were: BP 124/86 (BP Location: Left Arm)   Pulse 80   Temp 97.8 F (36.6 C) (Oral)   Resp 18   Ht 5\' 2"  (1.575 m)   Wt 81.2 kg   SpO2 99%   BMI 32.74 kg/m  If your blood pressure (bp) was elevated above 135/85 this visit, please have this repeated by your doctor within one month. --------------

## 2020-01-11 NOTE — ED Provider Notes (Signed)
[facility] EMERGENCY DEPARTMENT Provider Note   CSN: [id number] Arrival date & time: 01/11/20  1339     History Chief Complaint  Patient presents with   Abdominal Pain    Deborah Petersen is a 69 y.o. female.  Patient with history of multiple abdominal surgeries including gastric bypass, cholecystectomy, appendectomy, multiple hernia surgeries, hysterectomy --presents to the emergency department for abdominal pain and vomiting.  Patient was in her usual state of health yesterday.  This morning she developed persistent vomiting with "black" vomit.  Abdominal pain is in the mid abdomen.  She feels bloated.  She has not had a bowel movement and has not been passing gas.  No urinary symptoms.  She denies fevers, chest pain, shortness of breath or cough.  She states that her symptoms feel similar to when she had her gallbladder out last year.  No treatments prior to arrival.        Past Medical History:  Diagnosis Date   ADHD    Anemia    Brain tumor ([place]) 2005   Dental crowns present    Food allergy    Gallbladder problem    Hypothyroidism    Macromastia 10/2011   Rheumatoid arthritis(714.0)    no current problems or meds.   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   Acute cholecystitis 12/07/2018   Febrile illness 07/20/2018   History of abdominal hernia W MESH REPAIR 2003 04/17/2018   Itching with irritation- abdominal wall overlaying mesh 04/17/2018   Hypertension 12/17/2017   Chronic fatigue 12/17/2017   Status post R bunionectomy- 4/19 12/17/2017   Laryngopharyngeal reflux (LPR) 08/22/2017   Acute pharyngitis 08/22/2017   h/o Elevated LDL cholesterol level  07/25/2017   Cervical pain (neck) 02/12/2017   Hypothyroidism 10/03/2016   Cyst of meniscus of left knee- s/p sx Dr Tonita Cong- [facility] 10/03/2016   S/P hysterectomy- no cervix 10/03/2016   History of non anemic vitamin B12 deficiency 10/03/2016   BMI 35.0-35.9,adult 10/03/2016   h/o RA (rheumatoid arthritis) - cured by faith and prayer    Dental crowns present    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    Vitamin D deficiency    Fe Def Anemia    Macromastia 10/29/2011   Other bacterial meningitis 05/01/2003    Past Surgical History:  Procedure Laterality Date   ABDOMINAL HYSTERECTOMY     partial   ANTERIOR AND POSTERIOR REPAIR  2001   APPENDECTOMY     BRAIN MENINGIOMA EXCISION  2004   BREAST REDUCTION SURGERY  11/19/2011   Procedure: MAMMARY REDUCTION  (BREAST);  Surgeon: Cristine Polio, MD;  Location: [facility];  Service: Plastics;  Laterality: Bilateral;   CHOLECYSTECTOMY N/A 12/08/2018   Procedure: LAPAROSCOPIC CHOLECYSTECTOMY;  Surgeon: Coralie Keens, MD;  Location: [facility];  Service: General;  Laterality: N/A;   FOOT SURGERY     HERNIA REPAIR     KNEE ARTHROSCOPY Left 06/09/2015   Procedure: LEFT KNEE ARTHROSCOPY WITH PARTIAL MEDIAL AND LATERAL MENISECTOMY AND DEBRIDEMENT;  Surgeon: Susa Day, MD;  Location: [facility];  Service: Orthopedics;  Laterality: Left;   KNEE SURGERY     TONSILLECTOMY  OB History    Gravida  3   Para  3   Term  3   Preterm      AB      Living  3     SAB      TAB      Ectopic      Multiple      Live Births              Family History  Problem Relation Age of Onset   Alcoholism Father     Social History   Tobacco Use   Smoking status: Former Smoker    Packs/day: 3.00    Types: Cigarettes    Start date: 05/01/1971    Quit date: 04/30/1976    Years since quitting: 43.7   Smokeless tobacco: Never Used  Vaping Use   Vaping Use: Never used    Substance Use Topics   Alcohol use: No   Drug use: No    Home Medications Prior to Admission medications   Medication Sig Start Date End Date Taking? Authorizing Provider  aspirin EC 81 MG tablet Take 81 mg by mouth daily.    [provider]  buPROPion (WELLBUTRIN SR) 150 MG 12 hr tablet TAKE 1 TABLET(150 MG) BY MOUTH DAILY 12/09/19   Dennard Nip D, MD  chlorthalidone (HYGROTON) 25 MG tablet Take 1 tablet (25 mg total) by mouth daily. 07/02/19   Opalski, Neoma Laming, DO  Cholecalciferol (VITAMIN D3) 5000 units TABS 5,000 IU OTC vitamin D3 daily. 12/17/17   Opalski, Deborah, DO  COD LIVER OIL PO Take 1 capsule by mouth every morning.     [provider]  escitalopram (LEXAPRO) 10 MG tablet TAKE 1 TABLET(10 MG) BY MOUTH DAILY 12/09/19   Dennard Nip D, MD  Iron-FA-B Cmp-C-Biot-Probiotic (FUSION PLUS) CAPS Take 1 capsule by mouth daily. 09/29/19   [name], PA-C  levothyroxine (SYNTHROID) 100 MCG tablet Take 1 tablet (100 mcg total) by mouth daily before breakfast. Patient needs office visit for further refills. 07/02/19   Opalski, Neoma Laming, DO  Turmeric 500 MG TABS Take 1 tablet by mouth daily.     [provider]    Allergies    Almond (diagnostic), Fruit & vegetable daily [nutritional supplements], and Erythromycin  Review of Systems   Review of Systems  Constitutional: Negative for fever.  HENT: Negative for rhinorrhea and sore throat.   Eyes: Negative for redness.  Respiratory: Negative for cough.   Cardiovascular: Negative for chest pain.  Gastrointestinal: Positive for abdominal distention, abdominal pain, nausea and vomiting. Negative for diarrhea.  Genitourinary: Negative for dysuria, frequency, hematuria and urgency.  Musculoskeletal: Negative for myalgias.  Skin: Negative for rash.  Neurological: Negative for headaches.    Physical Exam Updated Vital Signs BP (!) 139/101 (BP Location: Left Arm)    Pulse 72    Temp 97.8 F (36.6 C) (Oral)     Resp 20    Ht 5\' 2"  (1.575 m)    Wt 81.2 kg    SpO2 100%    BMI 32.74 kg/m   Physical Exam Vitals and nursing note reviewed.  Constitutional:      General: She is not in acute distress.    Appearance: She is well-developed.  HENT:     Head: Normocephalic and atraumatic.     Right Ear: External ear normal.     Left Ear: External ear normal.     Nose: Nose normal.  Eyes:     Conjunctiva/sclera: Conjunctivae  normal.  Cardiovascular:     Rate and Rhythm: Normal rate and regular rhythm.     Heart sounds: No murmur heard.   Pulmonary:     Effort: No respiratory distress.     Breath sounds: No wheezing, rhonchi or rales.  Abdominal:     Palpations: Abdomen is soft.     Tenderness: There is generalized abdominal tenderness. There is no guarding or rebound. Negative signs include Murphy's sign and McBurney's sign.  Musculoskeletal:     Cervical back: Normal range of motion and neck supple.     Right lower leg: No edema.     Left lower leg: No edema.  Skin:    General: Skin is warm and dry.     Findings: No rash.  Neurological:     General: No focal deficit present.     Mental Status: She is alert. Mental status is at baseline.     Motor: No weakness.  Psychiatric:        Mood and Affect: Mood normal.     ED Results / Procedures / Treatments   Labs (all labs ordered are listed, but only abnormal results are displayed) Labs Reviewed  COMPREHENSIVE METABOLIC PANEL - Abnormal; Notable for the following components:      Result Value   Sodium 133 (*)    Chloride 94 (*)    Glucose, Bld 170 (*)    Creatinine, Ser 1.01 (*)    GFR calc non Af Amer 58 (*)    All other components within normal limits  CBC - Abnormal; Notable for the following components:   WBC 12.5 (*)    RBC 5.39 (*)    All other components within normal limits  LIPASE, BLOOD  URINALYSIS, ROUTINE W REFLEX MICROSCOPIC    EKG None  Radiology CT ABDOMEN PELVIS W CONTRAST  Result Date: 01/11/2020 CLINICAL  DATA:  Acute abdominal pain EXAM: CT ABDOMEN AND PELVIS WITH CONTRAST TECHNIQUE: Multidetector CT imaging of the abdomen and pelvis was performed using the standard protocol following bolus administration of intravenous contrast. CONTRAST:  146mL OMNIPAQUE IOHEXOL 300 MG/ML  SOLN COMPARISON:  None. FINDINGS: Lower chest: The visualized heart size within normal limits. No pericardial fluid/thickening. No hiatal hernia. There is a stable 7 mm pulmonary nodule in the posterior left lower lobe with small areas of bronchiectasis. Hepatobiliary: The liver is normal in density without focal abnormality.The main portal vein is patent. The patient is status post cholecystectomy. No biliary ductal dilation. Pancreas: Unremarkable. No pancreatic ductal dilatation or surrounding inflammatory changes. Spleen: Normal in size without focal abnormality. Adrenals/Urinary Tract: Both adrenal glands appear normal. The kidneys and collecting system appear normal without evidence of urinary tract calculus or hydronephrosis. Bladder is unremarkable. Stomach/Bowel: The patient is status post Roux-en-Y gastric bypass. The gastrojejunostomy anastomosis appears to be intact. There is a small hiatal hernia however present with mild wall thickening seen in the distal esophagus. The gastric remanent appears to be unremarkable. There is focal mild dilatation at the jejunal-jejunal anastomosis measuring up to 4.9 cm. The remainder of the jejunal loops appear to be mildly prominent measuring up to 3 cm in transverse dimension. The ileal loops however are decompressed. The colon is normal in appearance with a moderate amount of stool present. Vascular/Lymphatic: There are no enlarged mesenteric, retroperitoneal, or pelvic lymph nodes. Scattered aortic atherosclerotic calcifications are seen without aneurysmal dilatation. Reproductive: The patient is status post hysterectomy. No adnexal masses or collections seen. Other: Small fat containing  inguinal hernias  noted. Musculoskeletal: No acute or significant osseous findings. IMPRESSION: Small hiatal hernia with findings that could be suggestive esophagitis. Mild focally dilated jejunal jejunal anastomosis with mildly dilated proximal jejunal loops which could be due to focal ileus. Aortic Atherosclerosis (ICD10-I70.0). Electronically Signed   By: Prudencio Pair M.D.   On: 01/11/2020 16:16    Procedures Procedures (including critical care time)  Medications Ordered in ED Medications  ondansetron (ZOFRAN) injection 4 mg (4 mg Intravenous Given 01/11/20 1509)  HYDROmorphone (DILAUDID) injection 0.5 mg (0.5 mg Intravenous Given 01/11/20 1510)  sodium chloride 0.9 % bolus 1,000 mL (1,000 mLs Intravenous New Bag/Given 01/11/20 1504)    ED Course  I have reviewed the triage vital signs and the nursing notes.  Pertinent labs & imaging results that were available during my care of the patient were reviewed by me and considered in my medical decision making (see chart for details).  Patient seen and examined. Work-up initiated. Medications ordered.  We will proceed with CT imaging given extensive surgical history to rule out bowel obstruction or infection.  Vital signs reviewed and are as follows: BP (!) 139/101 (BP Location: Left Arm)    Pulse 72    Temp 97.8 F (36.6 C) (Oral)    Resp 20    Ht 5\' 2"  (1.575 m)    Wt 81.2 kg    SpO2 100%    BMI 32.74 kg/m   6:55 PM CT reviewed and patient updated.  P.o. challenge ordered.  Patient reports some cramping with drinking fluids, but no vomiting.  Awaiting results of UA which was just obtained.  Patient discussed with Dr. Sedonia Small.   Plan for discharge to home.  Prescription for omeprazole, Pepcid, Bentyl, Zofran.  Patient counseled to hold baby aspirin for 3 days and until symptoms improve.  All results reviewed with patient and husband at bedside.  She is comfortable with discharge home.  The patient was urged to return to the Emergency  Department immediately with worsening of current symptoms, worsening abdominal pain, persistent vomiting, blood noted in stools, fever, or any other concerns. The patient verbalized understanding.      MDM Rules/Calculators/A&P                          Patient with abdominal pain, vomiting, question of blood in vomit. Vitals are stable, no fever. Labs show slightly elevated white blood cell count, otherwise normal including hemoglobin. Imaging of the abdomen and pelvis with possible signs of esophagitis and ileus, no SBO or significant infection. No signs of dehydration, patient is now tolerating PO's. Lungs are clear and no signs suggestive of PNA. Low concern for appendicitis, cholecystitis, pancreatitis, ruptured viscus, UTI, kidney stone, aortic dissection, aortic aneurysm or other emergent abdominal etiology. Supportive therapy indicated with return if symptoms worsen.   Final Clinical Impression(s) / ED Diagnoses Final diagnoses:  Generalized abdominal pain  Non-intractable vomiting with nausea, unspecified vomiting type  Acute gastritis, presence of bleeding unspecified, unspecified gastritis type    Rx / DC Orders ED Discharge Orders         Ordered    ondansetron (ZOFRAN ODT) 4 MG disintegrating tablet  Every 8 hours PRN        01/11/20 1853    omeprazole (PRILOSEC) 20 MG capsule  Daily        01/11/20 1853    famotidine (PEPCID) 20 MG tablet  2 times daily        01/11/20 1854  dicyclomine (BENTYL) 20 MG tablet  2 times daily        01/11/20 1854           Carlisle Cater, Hershal Coria 01/11/20 1907    Maudie Flakes, MD 01/11/20 [phone]

## 2020-01-11 NOTE — ED Triage Notes (Signed)
Pt c/o abd pain, "throwing up black" started this am-NAD-steady slow gait-NAD

## 2020-01-11 NOTE — ED Notes (Signed)
Pt unable to provide a urine sample at triage.

## 2020-01-11 NOTE — ED Notes (Signed)
Pt had cup of ice water without any issue

## 2020-01-12 ENCOUNTER — Encounter (INDEPENDENT_AMBULATORY_CARE_PROVIDER_SITE_OTHER): Payer: Self-pay

## 2020-01-12 NOTE — Telephone Encounter (Signed)
Message sent to pt.

## 2020-01-13 ENCOUNTER — Encounter (INDEPENDENT_AMBULATORY_CARE_PROVIDER_SITE_OTHER): Payer: Self-pay | Admitting: Adult Health

## 2020-01-13 ENCOUNTER — Ambulatory Visit (INDEPENDENT_AMBULATORY_CARE_PROVIDER_SITE_OTHER): Payer: Medicare PPO | Admitting: Adult Health

## 2020-01-13 ENCOUNTER — Other Ambulatory Visit: Payer: Self-pay

## 2020-01-13 VITALS — BP 110/74 | Temp 98.8°F | Ht 62.0 in | Wt 174.0 lb

## 2020-01-13 DIAGNOSIS — E7849 Other hyperlipidemia: Secondary | ICD-10-CM

## 2020-01-13 DIAGNOSIS — Z6831 Body mass index (BMI) 31.0-31.9, adult: Secondary | ICD-10-CM

## 2020-01-13 DIAGNOSIS — E669 Obesity, unspecified: Secondary | ICD-10-CM

## 2020-01-13 DIAGNOSIS — R739 Hyperglycemia, unspecified: Secondary | ICD-10-CM | POA: Diagnosis not present

## 2020-01-13 DIAGNOSIS — I10 Essential (primary) hypertension: Secondary | ICD-10-CM

## 2020-01-13 DIAGNOSIS — F3289 Other specified depressive episodes: Secondary | ICD-10-CM | POA: Diagnosis not present

## 2020-01-13 MED ORDER — ESCITALOPRAM OXALATE 10 MG PO TABS: ORAL_TABLET | ORAL | 0 refills | Status: DC

## 2020-01-13 MED ORDER — BUPROPION HCL ER (SR) 150 MG PO TB12: ORAL_TABLET | ORAL | 0 refills | Status: DC

## 2020-01-14 ENCOUNTER — Encounter (INDEPENDENT_AMBULATORY_CARE_PROVIDER_SITE_OTHER): Payer: Self-pay | Admitting: Adult Health

## 2020-01-14 LAB — COMPREHENSIVE METABOLIC PANEL
ALT: 16 IU/L (ref 0–32)
AST: 20 IU/L (ref 0–40)
Albumin/Globulin Ratio: 2 (ref 1.2–2.2)
Albumin: 4.8 g/dL (ref 3.8–4.8)
Alkaline Phosphatase: 104 IU/L (ref 44–121)
BUN/Creatinine Ratio: 21 (ref 12–28)
BUN: 21 mg/dL (ref 8–27)
Bilirubin Total: 0.5 mg/dL (ref 0.0–1.2)
CO2: 27 mmol/L (ref 20–29)
Calcium: 10.2 mg/dL (ref 8.7–10.3)
Chloride: 96 mmol/L (ref 96–106)
Creatinine, Ser: 1.01 mg/dL — ABNORMAL HIGH (ref 0.57–1.00)
GFR calc Af Amer: 67 mL/min/{1.73_m2} (ref >59)
GFR calc non Af Amer: 58 mL/min/{1.73_m2} — ABNORMAL LOW (ref >59)
Globulin, Total: 2.4 g/dL (ref 1.5–4.5)
Glucose: 94 mg/dL (ref 65–99)
Potassium: 4.2 mmol/L (ref 3.5–5.2)
Sodium: 136 mmol/L (ref 134–144)
Total Protein: 7.2 g/dL (ref 6.0–8.5)

## 2020-01-14 LAB — HEMOGLOBIN A1C
Est. average glucose Bld gHb Est-mCnc: 114 mg/dL
Hgb A1c MFr Bld: 5.6 % (ref 4.8–5.6)

## 2020-01-14 LAB — INSULIN, RANDOM: INSULIN: 9.2 u[IU]/mL (ref 2.6–24.9)

## 2020-01-14 NOTE — Telephone Encounter (Signed)
Please advise 

## 2020-01-14 NOTE — Progress Notes (Signed)
Chief Complaint:   OBESITY Deborah Petersen is here to discuss her progress with her obesity treatment plan along with follow-up of her obesity related diagnoses. Deborah Petersen is on practicing portion control and making smarter food choices, such as increasing vegetables and decreasing simple carbohydrates with 75+ grams of protein daily and states she is following her eating plan approximately 0% of the time. Deborah Petersen states she is working.  Today's visit was #: 6 Starting weight: 184 lbs Starting date: 09/15/2019 Today's weight: 174 lbs Today's date: 01/13/2020 Total lbs lost to date: 10 lbs Total lbs lost since last in-office visit: 3 lbs  Interim History: Deborah Petersen was seen in the ED on 01/11/2020, for generalized abdominal pain with nausea and vomiting.  CT abdomin/pelvis with contrast/ labs completed on 01/11/2020 were reviewed with pt today.  She was discharged home stable with instructions to return if symptoms worsened.  She denies current GI upset.  She has been following PC/[place] with at least 75 grams of protein per day.  She established with Dr. Britt Bottom Schooler/[facility] on [place] [address].  She has a follow-up on January 20, 2020.  Subjective:   1. Essential hypertension Review: taking medications as instructed, no medication side effects noted, no chest pain on exertion, no dyspnea on exertion, no swelling of ankles.  Blood pressure at goal at today's office visit.  She is on chlorthalidone 25 mg daily.  BP Readings from Last 3 Encounters:  01/13/20 110/74  01/11/20 132/90  12/15/19 123/79   2. Other hyperlipidemia mixed Deborah Petersen has hyperlipidemia and has been trying to improve her cholesterol levels with intensive lifestyle modification including a low saturated fat diet, exercise and weight loss. She denies any chest pain, claudication or myalgias.  Total and LDL both elevated at last check.  She is not on statin therapy.  Lab Results  Component Value Date   ALT 16 01/13/2020   AST  20 01/13/2020   ALKPHOS 104 01/13/2020   BILITOT 0.5 01/13/2020   Lab Results  Component Value Date   CHOL 215 (H) 09/15/2019   HDL 92 09/15/2019   LDLCALC 105 (H) 09/15/2019   TRIG 108 09/15/2019   CHOLHDL 2.4 01/23/2019   3. Hyperglycemia Deborah Petersen has a history of some elevated blood glucose readings without a diagnosis of diabetes. She denies polyphagia.  She is not on metformin.    4. Other depression, with emotional eating  She reports stable mood.  Denies SI/HI. She established with Dr. Britt Bottom Schooler/[facility] on [place] [address].  She has a follow-up on January 20, 2020.  Assessment/Plan:   1. Essential hypertension Deborah Petersen is working on healthy weight loss and exercise to improve blood pressure control. We will watch for signs of hypotension as she continues her lifestyle modifications.  Continue healthy eating and diuretic.  - Comprehensive metabolic panel  2. Other hyperlipidemia mixed Cardiovascular risk and specific lipid/LDL goals reviewed.  We discussed several lifestyle modifications today and Deborah Petersen will continue to work on diet, exercise and weight loss efforts. Orders and follow up as documented in patient record.  Check labs today.  Counseling Intensive lifestyle modifications are the first line treatment for this issue. . Dietary changes: Increase soluble fiber. Decrease simple carbohydrates. . Exercise changes: Moderate to vigorous-intensity aerobic activity 150 minutes per week if tolerated. . Lipid-lowering medications: see documented in medical record.  - Lipid panel  3. Hyperglycemia Fasting labs will be obtained and results with be discussed with Deborah Petersen in 2 weeks at her  follow up visit. In the meanwhile Deborah Petersen was started on a lower simple carbohydrate diet and will work on weight loss efforts.  Check labs today - A1c/insulin level.  - Hemoglobin A1c - Insulin, random  4. Other depression, with emotional eating  Refill escitalopram 10 mg  daily today and bupropion SR 150 mg daily.  Continue with Dr. Michail Sermon as directed.   -Refill buPROPion (WELLBUTRIN SR) 150 MG 12 hr tablet; TAKE 1 TABLET(150 MG) BY MOUTH DAILY  Dispense: 30 tablet; Refill: 0 -Refill escitalopram (LEXAPRO) 10 MG tablet; TAKE 1 TABLET(10 MG) BY MOUTH DAILY  Dispense: 30 tablet; Refill: 0  5. Class 1 obesity with serious comorbidity and body mass index (BMI) of 31.0 to 31.9 in adult, unspecified obesity type Deborah Petersen is currently in the action stage of change. As such, her goal is to continue with weight loss efforts. She has agreed to practicing portion control and making smarter food choices, such as increasing vegetables and decreasing simple carbohydrates +75 grams of protein daily.   Exercise goals: No exercise has been prescribed at this time.  Behavioral modification strategies: increasing lean protein intake, meal planning and cooking strategies and planning for success.  Deborah Petersen has agreed to follow-up with our clinic in 3 weeks. She was informed of the importance of frequent follow-up visits to maximize her success with intensive lifestyle modifications for her multiple health conditions.   Deborah Petersen was informed we would discuss her lab results at her next visit unless there is a critical issue that needs to be addressed sooner. Deborah Petersen agreed to keep her next visit at the agreed upon time to discuss these results.  Objective:   Blood pressure 110/74, temperature 98.8 F (37.1 C), temperature source Oral, height 5\' 2"  (1.575 m), weight 174 lb (78.9 kg), SpO2 98 %. Body mass index is 31.83 kg/m.  General: Cooperative, alert, well developed, in no acute distress. HEENT: Conjunctivae and lids unremarkable. Cardiovascular: Regular rhythm.  Lungs: Normal work of breathing. Neurologic: No focal deficits.   Lab Results  Component Value Date   CREATININE 1.01 (H) 01/13/2020   BUN 21 01/13/2020   NA 136 01/13/2020   K 4.2 01/13/2020   CL 96 01/13/2020    CO2 27 01/13/2020   Lab Results  Component Value Date   ALT 16 01/13/2020   AST 20 01/13/2020   ALKPHOS 104 01/13/2020   BILITOT 0.5 01/13/2020   Lab Results  Component Value Date   HGBA1C 5.6 01/13/2020   HGBA1C 5.3 09/15/2019   HGBA1C 5.5 01/23/2019   HGBA1C 5.4 07/25/2017   HGBA1C 5.3 10/03/2016   Lab Results  Component Value Date   INSULIN 9.2 01/13/2020   INSULIN 7.2 09/15/2019   Lab Results  Component Value Date   TSH 0.337 (L) 09/15/2019   Lab Results  Component Value Date   CHOL 215 (H) 09/15/2019   HDL 92 09/15/2019   LDLCALC 105 (H) 09/15/2019   TRIG 108 09/15/2019   CHOLHDL 2.4 01/23/2019   Lab Results  Component Value Date   WBC 12.5 (H) 01/11/2020   HGB 14.7 01/11/2020   HCT 45.1 01/11/2020   MCV 83.7 01/11/2020   PLT 292 01/11/2020   Lab Results  Component Value Date   IRON 108 07/16/2019   TIBC 308 07/16/2019   FERRITIN 78 07/16/2019    Obesity Behavioral Intervention:   Approximately 15 minutes were spent on the discussion below.  ASK: We discussed the diagnosis of obesity with Deborah Petersen today and Deborah Petersen agreed to  give [place] permission to discuss obesity behavioral modification therapy today.  ASSESS: Deborah Petersen has the diagnosis of obesity and her BMI today is 31.9. Deborah Petersen is in the action stage of change.   ADVISE: Deborah Petersen was educated on the multiple health risks of obesity as well as the benefit of weight loss to improve her health. She was advised of the need for long term treatment and the importance of lifestyle modifications to improve her current health and to decrease her risk of future health problems.  AGREE: Multiple dietary modification options and treatment options were discussed and Deborah Petersen agreed to follow the recommendations documented in the above note.  ARRANGE: Deborah Petersen was educated on the importance of frequent visits to treat obesity as outlined per CMS and USPSTF guidelines and agreed to schedule her next follow up  appointment today.  Attestation Statements:   Reviewed by clinician on day of visit: allergies, medications, problem list, medical history, surgical history, family history, social history, and previous encounter notes.  I, Water quality scientist, CMA, am acting as Location manager for Mina Marble, NP.  I have reviewed the above documentation for accuracy and completeness, and I agree with the above. -  Esaw Grandchild, NP

## 2020-01-14 NOTE — Telephone Encounter (Signed)
Pt response.

## 2020-01-20 ENCOUNTER — Ambulatory Visit (INDEPENDENT_AMBULATORY_CARE_PROVIDER_SITE_OTHER): Payer: Medicare PPO | Admitting: Psychologist

## 2020-01-20 DIAGNOSIS — F320 Major depressive disorder, single episode, mild: Secondary | ICD-10-CM | POA: Diagnosis not present

## 2020-01-20 DIAGNOSIS — E669 Obesity, unspecified: Secondary | ICD-10-CM

## 2020-01-27 ENCOUNTER — Other Ambulatory Visit: Payer: Self-pay | Admitting: Family Medicine

## 2020-01-27 DIAGNOSIS — E039 Hypothyroidism, unspecified: Secondary | ICD-10-CM

## 2020-01-27 DIAGNOSIS — I10 Essential (primary) hypertension: Secondary | ICD-10-CM

## 2020-02-01 ENCOUNTER — Telehealth: Payer: Self-pay | Admitting: General Practice

## 2020-02-01 ENCOUNTER — Other Ambulatory Visit: Payer: Self-pay | Admitting: Physician Assistant

## 2020-02-01 DIAGNOSIS — E039 Hypothyroidism, unspecified: Secondary | ICD-10-CM

## 2020-02-01 DIAGNOSIS — I10 Essential (primary) hypertension: Secondary | ICD-10-CM

## 2020-02-01 DIAGNOSIS — D509 Iron deficiency anemia, unspecified: Secondary | ICD-10-CM

## 2020-02-01 MED ORDER — FUSION PLUS PO CAPS: 1.0000 | ORAL_CAPSULE | Freq: Every day | ORAL | 0 refills | Status: DC

## 2020-02-01 MED ORDER — LEVOTHYROXINE SODIUM 100 MCG PO TABS: 100.0000 ug | ORAL_TABLET | Freq: Every day | ORAL | 0 refills | Status: DC

## 2020-02-01 MED ORDER — CHLORTHALIDONE 25 MG PO TABS: 25.0000 mg | ORAL_TABLET | Freq: Every day | ORAL | 0 refills | Status: DC

## 2020-02-01 NOTE — Telephone Encounter (Signed)
#  60 day supply sent to pharmacy. Please call patient to schedule apt for further refills. AS, CMA

## 2020-02-01 NOTE — Addendum Note (Signed)
Addended by: Mickel Crow on: 02/01/2020 10:44 AM   Modules accepted: Orders

## 2020-02-01 NOTE — Telephone Encounter (Signed)
Patient needs refills on [place], Synthroid, and Fusion Plus. Please send to [facility] on [address]. Thanks

## 2020-02-02 ENCOUNTER — Ambulatory Visit (INDEPENDENT_AMBULATORY_CARE_PROVIDER_SITE_OTHER): Payer: Medicare PPO | Admitting: Adult Health

## 2020-02-02 ENCOUNTER — Ambulatory Visit: Payer: Medicare PPO | Admitting: Psychologist

## 2020-02-02 ENCOUNTER — Encounter (INDEPENDENT_AMBULATORY_CARE_PROVIDER_SITE_OTHER): Payer: Self-pay | Admitting: Adult Health

## 2020-02-02 ENCOUNTER — Other Ambulatory Visit: Payer: Self-pay

## 2020-02-02 VITALS — BP 132/83 | HR 70 | Temp 98.6°F | Ht 62.0 in | Wt 177.0 lb

## 2020-02-02 DIAGNOSIS — R7303 Prediabetes: Secondary | ICD-10-CM | POA: Insufficient documentation

## 2020-02-02 DIAGNOSIS — E669 Obesity, unspecified: Secondary | ICD-10-CM

## 2020-02-02 DIAGNOSIS — R944 Abnormal results of kidney function studies: Secondary | ICD-10-CM | POA: Insufficient documentation

## 2020-02-02 DIAGNOSIS — Z6832 Body mass index (BMI) 32.0-32.9, adult: Secondary | ICD-10-CM

## 2020-02-02 MED ORDER — WEGOVY 0.25 MG/0.5ML ~~LOC~~ SOAJ: 0.2500 mg | SUBCUTANEOUS | 0 refills | Status: DC

## 2020-02-02 NOTE — Progress Notes (Signed)
Chief Complaint:   OBESITY Deborah Petersen is here to discuss her progress with her obesity treatment plan along with follow-up of her obesity related diagnoses. Deborah Petersen is practicing portion control and making smarter food choices, such as increasing vegetables and decreasing simple carbohydrates and states she is following her eating plan approximately 30% of the time. Deborah Petersen states she is exercising 0 minutes 0 times per week.  Today's visit was #: 7 Starting weight: 184 lbs Starting date: 09/15/2019 Today's weight: 177 lbs Today's date: 02/02/2020 Total lbs lost to date: 7 Total lbs lost since last in-office visit: 0  Interim History: Deborah Petersen will have a dental procedure on 03/01/2020, in which a bone grafting solution will be applied to her upper jaw with an approximate healing time of 7 months. Her on-going dental procedures have made mastication a challenge and she has focused on the softest/highest protein option foods. Over the last several weeks-she traveled to the beach for 1 week in [place] and then her son visited for one week.   Subjective:   Prediabetes. Deborah Petersen has a diagnosis of prediabetes based on her elevated HgA1c and was informed this puts her at greater risk of developing diabetes. She continues to work on diet and exercise to decrease her risk of diabetes. She denies nausea or hypoglycemia. 01/13/2020 blood glucose 94 with an A1c of 5.6, which was elevated from 5.3 on 09/15/2019. Insulin level was also slightly elevated to 9.2 (up from 7.2 on 09/15/2019). Deborah Petersen denies personal or family history of MTC. Labs were discussed with the patient today.   Lab Results  Component Value Date   HGBA1C 5.6 01/13/2020   Lab Results  Component Value Date   INSULIN 9.2 01/13/2020   INSULIN 7.2 09/15/2019   Decreased GFR. On Epic review, GFR has been mid-50 to mid-70's over the last several years. GFR on 01/13/2020 was 58. Labs were discussed with the patient today.    Assessment/Plan:   Prediabetes. Deborah Petersen will continue to work on weight loss, exercise, and decreasing simple carbohydrates to help decrease the risk of diabetes. We discussed risks/benefits of GLP-1. Labs will be checked every 3 months.   Decreased GFR. Deborah Petersen was instructed to limit her intake of NSAIDS. Labs will be checked every 3 months.   Class 1 obesity with serious comorbidity and body mass index (BMI) of 32.0 to 32.9 in adult, unspecified obesity type. Deborah Petersen denies personal or family history of MTC. She will start Semaglutide-Weight Management (WEGOVY) 0.25 MG/0.5ML SOAJ once weekly, #2 mL with 0 refills.  Deborah Petersen is currently in the action stage of change. As such, her goal is to continue with weight loss efforts. She has agreed to practicing portion control and making smarter food choices, such as increasing vegetables and decreasing simple carbohydrates.    Exercise goals: Deborah Petersen will continue her current exercise regimen.   Behavioral modification strategies: increasing lean protein intake, decreasing simple carbohydrates, increasing vegetables, no skipping meals, meal planning and cooking strategies, better snacking choices and planning for success.  Deborah Petersen has agreed to follow-up with our clinic in 3 weeks. She was informed of the importance of frequent follow-up visits to maximize her success with intensive lifestyle modifications for her multiple health conditions.   Objective:   Blood pressure 132/83, pulse 70, temperature 98.6 F (37 C), height 5\' 2"  (1.575 m), weight 177 lb (80.3 kg), SpO2 98 %. Body mass index is 32.37 kg/m.  General: Cooperative, alert, well developed, in no acute distress. HEENT: Conjunctivae  and lids unremarkable. Cardiovascular: Regular rhythm.  Lungs: Normal work of breathing. Neurologic: No focal deficits.   Lab Results  Component Value Date   CREATININE 1.01 (H) 01/13/2020   BUN 21 01/13/2020   NA 136 01/13/2020   K 4.2 01/13/2020    CL 96 01/13/2020   CO2 27 01/13/2020   Lab Results  Component Value Date   ALT 16 01/13/2020   AST 20 01/13/2020   ALKPHOS 104 01/13/2020   BILITOT 0.5 01/13/2020   Lab Results  Component Value Date   HGBA1C 5.6 01/13/2020   HGBA1C 5.3 09/15/2019   HGBA1C 5.5 01/23/2019   HGBA1C 5.4 07/25/2017   HGBA1C 5.3 10/03/2016   Lab Results  Component Value Date   INSULIN 9.2 01/13/2020   INSULIN 7.2 09/15/2019   Lab Results  Component Value Date   TSH 0.337 (L) 09/15/2019   Lab Results  Component Value Date   CHOL 215 (H) 09/15/2019   HDL 92 09/15/2019   LDLCALC 105 (H) 09/15/2019   TRIG 108 09/15/2019   CHOLHDL 2.4 01/23/2019   Lab Results  Component Value Date   WBC 12.5 (H) 01/11/2020   HGB 14.7 01/11/2020   HCT 45.1 01/11/2020   MCV 83.7 01/11/2020   PLT 292 01/11/2020   Lab Results  Component Value Date   IRON 108 07/16/2019   TIBC 308 07/16/2019   FERRITIN 78 07/16/2019   Obesity Behavioral Intervention:   Approximately 15 minutes were spent on the discussion below.  ASK: We discussed the diagnosis of obesity with Deborah Petersen today and Deborah Petersen agreed to give [place] permission to discuss obesity behavioral modification therapy today.  ASSESS: Deborah Petersen has the diagnosis of obesity and her BMI today is 32.4. Deborah Petersen is in the action stage of change.   ADVISE: Deborah Petersen was educated on the multiple health risks of obesity as well as the benefit of weight loss to improve her health. She was advised of the need for long term treatment and the importance of lifestyle modifications to improve her current health and to decrease her risk of future health problems.  AGREE: Multiple dietary modification options and treatment options were discussed and Deborah Petersen agreed to follow the recommendations documented in the above note.  ARRANGE: Deborah Petersen was educated on the importance of frequent visits to treat obesity as outlined per CMS and USPSTF guidelines and agreed to schedule her next  follow up appointment today.  Attestation Statements:   Reviewed by clinician on day of visit: allergies, medications, problem list, medical history, surgical history, family history, social history, and previous encounter notes.  I, Deborah Petersen, am acting as Location manager for [employer], NP-C   I have reviewed the above documentation for accuracy and completeness, and I agree with the above. -  Deborah Oswald d. Khyrin Trevathan, NP-C

## 2020-02-26 ENCOUNTER — Other Ambulatory Visit: Payer: Medicare PPO

## 2020-02-29 ENCOUNTER — Encounter: Payer: Self-pay | Admitting: Physician Assistant

## 2020-02-29 ENCOUNTER — Other Ambulatory Visit: Payer: Self-pay

## 2020-02-29 ENCOUNTER — Ambulatory Visit: Payer: Medicare PPO | Admitting: Physician Assistant

## 2020-02-29 VITALS — BP 112/69 | HR 78 | Ht 62.0 in | Wt 181.2 lb

## 2020-02-29 DIAGNOSIS — Z6832 Body mass index (BMI) 32.0-32.9, adult: Secondary | ICD-10-CM

## 2020-02-29 DIAGNOSIS — I10 Essential (primary) hypertension: Secondary | ICD-10-CM | POA: Diagnosis not present

## 2020-02-29 DIAGNOSIS — E039 Hypothyroidism, unspecified: Secondary | ICD-10-CM

## 2020-02-29 DIAGNOSIS — E669 Obesity, unspecified: Secondary | ICD-10-CM

## 2020-02-29 NOTE — Assessment & Plan Note (Addendum)
-  BP stable -Continue current medication regimen. Not due for refill yet. -Follow a low sodium diet. -Stay well hydrated. -Will continue to monitor.

## 2020-02-29 NOTE — Progress Notes (Signed)
Established Patient Office Visit  Subjective:  Patient ID: Deborah Petersen, female    DOB: 1950-06-21  Age: 69 y.o. MRN: [id number]  CC:  Chief Complaint  Patient presents with  . Hypertension  . Thyroid Problem    HPI Deborah Petersen presents for follow up on hypertension and hypothyroidism.  HTN: Pt denies chest pain, palpitations, dizziness or lower extremity swelling. Taking medication as directed without side effects. Doesn't check BP at home. Pt reports good hydration.  Hypothyroidism: Denies fatigue, palpitations, or bowel changes. Reports medication compliance.  Weight: Patient reports she does not know if she will continue with weight loss program. States it is difficult to follow a meal plan when she has no teeth and is in the process for dental implants.   Past Medical History:  Diagnosis Date  . ADHD   . Anemia   . Brain tumor ([place]) 2005  . Dental crowns present   . Food allergy   . Gallbladder problem   . Hypothyroidism   . Macromastia 10/2011  . Rheumatoid arthritis(714.0)    no current problems or meds.  . Vitamin D deficiency     Past Surgical History:  Procedure Laterality Date  . ABDOMINAL HYSTERECTOMY     partial  . ANTERIOR AND POSTERIOR REPAIR  2001  . APPENDECTOMY    . BRAIN MENINGIOMA EXCISION  2004  . BREAST REDUCTION SURGERY  11/19/2011   Procedure: MAMMARY REDUCTION  (BREAST);  Surgeon: Cristine Polio, MD;  Location: [facility];  Service: Plastics;  Laterality: Bilateral;  . CHOLECYSTECTOMY N/A 12/08/2018   Procedure: LAPAROSCOPIC CHOLECYSTECTOMY;  Surgeon: Coralie Keens, MD;  Location: [facility];  Service: General;  Laterality: N/A;  . FOOT SURGERY    . HERNIA REPAIR    . KNEE ARTHROSCOPY Left 06/09/2015   Procedure: LEFT KNEE ARTHROSCOPY WITH PARTIAL MEDIAL AND LATERAL MENISECTOMY AND DEBRIDEMENT;  Surgeon: Susa Day, MD;  Location: [facility];  Service: Orthopedics;  Laterality: Left;  . KNEE SURGERY    . TONSILLECTOMY       Family History  Problem Relation Age of Onset  . Alcoholism Father     Social History   Socioeconomic History  . Marital status: Married    Spouse name: Deborah Petersen  . Number of children: Not on file  . Years of education: Not on file  . Highest education level: Not on file  Occupational History  . Occupation: Retired  Tobacco Use  . Smoking status: Former Smoker    Packs/day: 3.00    Types: Cigarettes    Start date: 05/01/1971    Quit date: 04/30/1976    Years since quitting: 43.8  . Smokeless tobacco: Never Used  Vaping Use  . Vaping Use: Never used  Substance and Sexual Activity  . Alcohol use: No  . Drug use: No  . Sexual activity: Not on file  Other Topics Concern  . Not on file  Social History Narrative  . Not on file   Social Determinants of Health   Financial Resource Strain:   . Difficulty of Paying Living Expenses: Not on file  Food Insecurity:   . Worried About Charity fundraiser in the Last Year: Not on file  . Ran Out of Food in the Last Year: Not on file  Transportation Needs:   . Lack of Transportation (Medical): Not on file  . Lack of Transportation (Non-Medical): Not on file  Physical Activity:   . Days of Exercise per Week: Not  on file  . Minutes of Exercise per Session: Not on file  Stress:   . Feeling of Stress : Not on file  Social Connections:   . Frequency of Communication with Friends and Family: Not on file  . Frequency of Social Gatherings with Friends and Family: Not on file  . Attends Religious Services: Not on file  . Active Member of Clubs or Organizations: Not on file  . Attends Archivist Meetings: Not on file  . Marital Status: Not on file  Intimate Partner Violence:   . Fear of Current or Ex-Partner: Not on file  . Emotionally Abused: Not on file  . Physically Abused: Not on file  . Sexually Abused: Not on file    Outpatient Medications Prior to Visit  Medication Sig Dispense Refill  . aspirin EC 81  MG tablet Take 81 mg by mouth daily.    . chlorthalidone (HYGROTON) 25 MG tablet Take 1 tablet (25 mg total) by mouth daily. **NEEDS APT FOR FURTHER REFILLS** 60 tablet 0  . Cholecalciferol (VITAMIN D3) 5000 units TABS 5,000 IU OTC vitamin D3 daily. 90 tablet 3  . COD LIVER OIL PO Take 1 capsule by mouth every morning.     . escitalopram (LEXAPRO) 10 MG tablet TAKE 1 TABLET(10 MG) BY MOUTH DAILY 30 tablet 0  . Iron-FA-B Cmp-C-Biot-Probiotic (FUSION PLUS) CAPS Take 1 capsule by mouth daily. **NEEDS APT FOR FURTHER REFILLS** 60 capsule 0  . levothyroxine (SYNTHROID) 100 MCG tablet Take 1 tablet (100 mcg total) by mouth daily before breakfast. **NEEDS APT FOR FURTHER REFILLS** 60 tablet 0  . Turmeric 500 MG TABS Take 1 tablet by mouth daily.     Marland Kitchen buPROPion (WELLBUTRIN SR) 150 MG 12 hr tablet TAKE 1 TABLET(150 MG) BY MOUTH DAILY (Patient not taking: Reported on 02/29/2020) 30 tablet 0  . dicyclomine (BENTYL) 20 MG tablet Take 1 tablet (20 mg total) by mouth 2 (two) times daily. (Patient not taking: Reported on 02/29/2020) 20 tablet 0  . omeprazole (PRILOSEC) 20 MG capsule Take 1 capsule (20 mg total) by mouth daily. (Patient not taking: Reported on 02/29/2020) 30 capsule 0  . Semaglutide-Weight Management (WEGOVY) 0.25 MG/0.5ML SOAJ Inject 0.25 mg into the skin once a week. 2 mL 0   No facility-administered medications prior to visit.    Allergies  Allergen Reactions  . Almond (Diagnostic)   . Fruit & Vegetable Daily [Nutritional Supplements] Swelling    Allergic to all fresh fruit  . Other   . Erythromycin Nausea And Vomiting    ROS Review of Systems A fourteen system review of systems was performed and found to be positive as per HPI.   Objective:    Physical Exam General:  Well Developed, well nourished, appropriate for stated age.  Neuro:  Alert and oriented,  extra-ocular muscles intact, no focal deficits  HEENT:  Normocephalic, atraumatic, neck supple Skin:  no gross rash, warm,  pink. Cardiac:  RRR, S1 S2 Respiratory:  ECTA B/L, Not using accessory muscles, speaking in full sentences- unlabored. Vascular:  Ext warm, no cyanosis apprec.; cap RF less 2 sec. Psych:  No HI/SI, judgement and insight good, Euthymic mood. Full Affect.  BP 112/69   Pulse 78   Ht 5\' 2"  (1.575 m)   Wt 181 lb 3.2 oz (82.2 kg)   SpO2 98%   BMI 33.14 kg/m  Wt Readings from Last 3 Encounters:  02/29/20 181 lb 3.2 oz (82.2 kg)  02/02/20 177 lb (80.3 kg)  01/13/20 174 lb (78.9 kg)     Health Maintenance Due  Topic Date Due  . COVID-19 Vaccine (1) Never done  . PNA vac Low Risk Adult (1 of 2 - PCV13) Never done  . INFLUENZA VACCINE  11/29/2019    There are no preventive care reminders to display for this patient.  Lab Results  Component Value Date   TSH 0.337 (L) 09/15/2019   Lab Results  Component Value Date   WBC 12.5 (H) 01/11/2020   HGB 14.7 01/11/2020   HCT 45.1 01/11/2020   MCV 83.7 01/11/2020   PLT 292 01/11/2020   Lab Results  Component Value Date   NA 136 01/13/2020   K 4.2 01/13/2020   CO2 27 01/13/2020   GLUCOSE 94 01/13/2020   BUN 21 01/13/2020   CREATININE 1.01 (H) 01/13/2020   BILITOT 0.5 01/13/2020   ALKPHOS 104 01/13/2020   AST 20 01/13/2020   ALT 16 01/13/2020   PROT 7.2 01/13/2020   ALBUMIN 4.8 01/13/2020   CALCIUM 10.2 01/13/2020   ANIONGAP 14 01/11/2020   Lab Results  Component Value Date   CHOL 215 (H) 09/15/2019   Lab Results  Component Value Date   HDL 92 09/15/2019   Lab Results  Component Value Date   LDLCALC 105 (H) 09/15/2019   Lab Results  Component Value Date   TRIG 108 09/15/2019   Lab Results  Component Value Date   CHOLHDL 2.4 01/23/2019   Lab Results  Component Value Date   HGBA1C 5.6 01/13/2020      Assessment & Plan:   Problem List Items Addressed This Visit      Cardiovascular and Mediastinum   Hypertension (Chronic)    -BP stable -Continue current medication regimen. Not due for refill  yet. -Follow a low sodium diet. -Stay well hydrated. -Will continue to monitor.        Endocrine   Hypothyroidism - Primary (Chronic)    -Last TSH mildly low at 0.337, free T4 and T3 normal. Asymptomatic -Will repeat thyroid function tests today.  -Pending lab results, will make medication dose adjustments if indicated.      Relevant Orders   TSH   T4, free   T3     Other   Class 1 obesity with serious comorbidity and body mass index (BMI) of 32.0 to 32.9 in adult    -Followed by [facility]. -Encourage to continue with dietary and lifestyle modifications.          No orders of the defined types were placed in this encounter.   Follow-up: Return for CPE next available .   Note:  This note was prepared with assistance of Dragon voice recognition software. Occasional wrong-word or sound-a-like substitutions may have occurred due to the inherent limitations of voice recognition software.  [name], PA-C

## 2020-02-29 NOTE — Assessment & Plan Note (Signed)
-  Followed by [facility]. -Encourage to continue with dietary and lifestyle modifications.

## 2020-02-29 NOTE — Assessment & Plan Note (Signed)
-  Last TSH mildly low at 0.337, free T4 and T3 normal. Asymptomatic -Will repeat thyroid function tests today.  -Pending lab results, will make medication dose adjustments if indicated.

## 2020-02-29 NOTE — Patient Instructions (Signed)
DASH Eating Plan DASH stands for "Dietary Approaches to Stop Hypertension." The DASH eating plan is a healthy eating plan that has been shown to reduce high blood pressure (hypertension). It may also reduce your risk for type 2 diabetes, heart disease, and stroke. The DASH eating plan may also help with weight loss. What are tips for following this plan?  General guidelines  Avoid eating more than 2,300 mg (milligrams) of salt (sodium) a day. If you have hypertension, you may need to reduce your sodium intake to 1,500 mg a day.  Limit alcohol intake to no more than 1 drink a day for nonpregnant women and 2 drinks a day for men. One drink equals 12 oz of beer, 5 oz of wine, or 1 oz of hard liquor.  Work with your health care provider to maintain a healthy body weight or to lose weight. Ask what an ideal weight is for you.  Get at least 30 minutes of exercise that causes your heart to beat faster (aerobic exercise) most days of the week. Activities may include walking, swimming, or biking.  Work with your health care provider or diet and nutrition specialist (dietitian) to adjust your eating plan to your individual calorie needs. Reading food labels   Check food labels for the amount of sodium per serving. Choose foods with less than 5 percent of the Daily Value of sodium. Generally, foods with less than 300 mg of sodium per serving fit into this eating plan.  To find whole grains, look for the word "whole" as the first word in the ingredient list. Shopping  Buy products labeled as "low-sodium" or "no salt added."  Buy fresh foods. Avoid canned foods and premade or frozen meals. Cooking  Avoid adding salt when cooking. Use salt-free seasonings or herbs instead of table salt or sea salt. Check with your health care provider or pharmacist before using salt substitutes.  Do not fry foods. Cook foods using healthy methods such as baking, boiling, grilling, and broiling instead.  Cook with  heart-healthy oils, such as olive, canola, soybean, or sunflower oil. Meal planning  Eat a balanced diet that includes: ? 5 or more servings of fruits and vegetables each day. At each meal, try to fill half of your plate with fruits and vegetables. ? Up to 6-8 servings of whole grains each day. ? Less than 6 oz of lean meat, poultry, or fish each day. A 3-oz serving of meat is about the same size as a deck of cards. One egg equals 1 oz. ? 2 servings of low-fat dairy each day. ? A serving of nuts, seeds, or beans 5 times each week. ? Heart-healthy fats. Healthy fats called Omega-3 fatty acids are found in foods such as flaxseeds and coldwater fish, like sardines, salmon, and mackerel.  Limit how much you eat of the following: ? Canned or prepackaged foods. ? Food that is high in trans fat, such as fried foods. ? Food that is high in saturated fat, such as fatty meat. ? Sweets, desserts, sugary drinks, and other foods with added sugar. ? Full-fat dairy products.  Do not salt foods before eating.  Try to eat at least 2 vegetarian meals each week.  Eat more home-cooked food and less restaurant, buffet, and fast food.  When eating at a restaurant, ask that your food be prepared with less salt or no salt, if possible. What foods are recommended? The items listed may not be a complete list. Talk with your dietitian about   what dietary choices are best for you. Grains Whole-grain or whole-wheat bread. Whole-grain or whole-wheat pasta. Brown rice. Oatmeal. Quinoa. Bulgur. Whole-grain and low-sodium cereals. Pita bread. Low-fat, low-sodium crackers. Whole-wheat flour tortillas. Vegetables Fresh or frozen vegetables (raw, steamed, roasted, or grilled). Low-sodium or reduced-sodium tomato and vegetable juice. Low-sodium or reduced-sodium tomato sauce and tomato paste. Low-sodium or reduced-sodium canned vegetables. Fruits All fresh, dried, or frozen fruit. Canned fruit in natural juice (without  added sugar). Meat and other protein foods Skinless chicken or turkey. Ground chicken or turkey. Pork with fat trimmed off. Fish and seafood. Egg whites. Dried beans, peas, or lentils. Unsalted nuts, nut butters, and seeds. Unsalted canned beans. Lean cuts of beef with fat trimmed off. Low-sodium, lean deli meat. Dairy Low-fat (1%) or fat-free (skim) milk. Fat-free, low-fat, or reduced-fat cheeses. Nonfat, low-sodium ricotta or cottage cheese. Low-fat or nonfat yogurt. Low-fat, low-sodium cheese. Fats and oils Soft margarine without trans fats. Vegetable oil. Low-fat, reduced-fat, or light mayonnaise and salad dressings (reduced-sodium). Canola, safflower, olive, soybean, and sunflower oils. Avocado. Seasoning and other foods Herbs. Spices. Seasoning mixes without salt. Unsalted popcorn and pretzels. Fat-free sweets. What foods are not recommended? The items listed may not be a complete list. Talk with your dietitian about what dietary choices are best for you. Grains Baked goods made with fat, such as croissants, muffins, or some breads. Dry pasta or rice meal packs. Vegetables Creamed or fried vegetables. Vegetables in a cheese sauce. Regular canned vegetables (not low-sodium or reduced-sodium). Regular canned tomato sauce and paste (not low-sodium or reduced-sodium). Regular tomato and vegetable juice (not low-sodium or reduced-sodium). Pickles. Olives. Fruits Canned fruit in a light or heavy syrup. Fried fruit. Fruit in cream or butter sauce. Meat and other protein foods Fatty cuts of meat. Ribs. Fried meat. Bacon. Sausage. Bologna and other processed lunch meats. Salami. Fatback. Hotdogs. Bratwurst. Salted nuts and seeds. Canned beans with added salt. Canned or smoked fish. Whole eggs or egg yolks. Chicken or turkey with skin. Dairy Whole or 2% milk, cream, and half-and-half. Whole or full-fat cream cheese. Whole-fat or sweetened yogurt. Full-fat cheese. Nondairy creamers. Whipped toppings.  Processed cheese and cheese spreads. Fats and oils Butter. Stick margarine. Lard. Shortening. Ghee. Bacon fat. Tropical oils, such as coconut, palm kernel, or palm oil. Seasoning and other foods Salted popcorn and pretzels. Onion salt, garlic salt, seasoned salt, table salt, and sea salt. Worcestershire sauce. Tartar sauce. Barbecue sauce. Teriyaki sauce. Soy sauce, including reduced-sodium. Steak sauce. Canned and packaged gravies. Fish sauce. Oyster sauce. Cocktail sauce. Horseradish that you find on the shelf. Ketchup. Mustard. Meat flavorings and tenderizers. Bouillon cubes. Hot sauce and Tabasco sauce. Premade or packaged marinades. Premade or packaged taco seasonings. Relishes. Regular salad dressings. Where to find more information:  National Heart, Lung, and Blood Institute: www.nhlbi.nih.gov  American Heart Association: www.heart.org Summary  The DASH eating plan is a healthy eating plan that has been shown to reduce high blood pressure (hypertension). It may also reduce your risk for type 2 diabetes, heart disease, and stroke.  With the DASH eating plan, you should limit salt (sodium) intake to 2,300 mg a day. If you have hypertension, you may need to reduce your sodium intake to 1,500 mg a day.  When on the DASH eating plan, aim to eat more fresh fruits and vegetables, whole grains, lean proteins, low-fat dairy, and heart-healthy fats.  Work with your health care provider or diet and nutrition specialist (dietitian) to adjust your eating plan to your   individual calorie needs. This information is not intended to replace advice given to you by your health care provider. Make sure you discuss any questions you have with your health care provider. Document Revised: 03/29/2017 Document Reviewed: 04/09/2016 Elsevier Patient Education  2020 [employer]vier Inc.  

## 2020-03-01 ENCOUNTER — Other Ambulatory Visit: Payer: Self-pay | Admitting: Physician Assistant

## 2020-03-01 DIAGNOSIS — E039 Hypothyroidism, unspecified: Secondary | ICD-10-CM

## 2020-03-01 LAB — T4, FREE: Free T4: 0.93 ng/dL (ref 0.82–1.77)

## 2020-03-01 LAB — T3: T3, Total: 75 ng/dL (ref 71–180)

## 2020-03-01 LAB — TSH: TSH: 16.1 u[IU]/mL — ABNORMAL HIGH (ref 0.450–4.500)

## 2020-03-01 MED ORDER — LEVOTHYROXINE SODIUM 112 MCG PO TABS: 112.0000 ug | ORAL_TABLET | Freq: Every day | ORAL | 1 refills | Status: DC

## 2020-03-02 ENCOUNTER — Telehealth: Payer: Self-pay | Admitting: General Practice

## 2020-03-02 ENCOUNTER — Ambulatory Visit (INDEPENDENT_AMBULATORY_CARE_PROVIDER_SITE_OTHER): Payer: Medicare PPO | Admitting: Adult Health

## 2020-03-02 NOTE — Telephone Encounter (Signed)
Patient states synthroid was denied. Please advise. [facility] on [address]. Thanks

## 2020-03-02 NOTE — Telephone Encounter (Signed)
Dose change on Levothyroxine. Patient is aware 60 day supply of new dose sent to pharmacy. AS, CMA

## 2020-03-16 ENCOUNTER — Other Ambulatory Visit: Payer: Self-pay | Admitting: Physician Assistant

## 2020-03-16 DIAGNOSIS — D509 Iron deficiency anemia, unspecified: Secondary | ICD-10-CM

## 2020-04-10 ENCOUNTER — Other Ambulatory Visit: Payer: Self-pay | Admitting: Physician Assistant

## 2020-04-10 DIAGNOSIS — E039 Hypothyroidism, unspecified: Secondary | ICD-10-CM

## 2020-04-11 ENCOUNTER — Telehealth: Payer: Self-pay | Admitting: Physician Assistant

## 2020-04-11 ENCOUNTER — Other Ambulatory Visit: Payer: Self-pay

## 2020-04-11 ENCOUNTER — Ambulatory Visit (INDEPENDENT_AMBULATORY_CARE_PROVIDER_SITE_OTHER): Payer: Medicare PPO | Admitting: Physician Assistant

## 2020-04-11 ENCOUNTER — Encounter: Payer: Self-pay | Admitting: Physician Assistant

## 2020-04-11 VITALS — BP 115/75 | HR 77 | Ht 62.0 in | Wt 180.3 lb

## 2020-04-11 DIAGNOSIS — E039 Hypothyroidism, unspecified: Secondary | ICD-10-CM | POA: Diagnosis not present

## 2020-04-11 DIAGNOSIS — Z0000 Encounter for general adult medical examination without abnormal findings: Secondary | ICD-10-CM | POA: Diagnosis not present

## 2020-04-11 DIAGNOSIS — I10 Essential (primary) hypertension: Secondary | ICD-10-CM | POA: Diagnosis not present

## 2020-04-11 MED ORDER — CHLORTHALIDONE 25 MG PO TABS: 25.0000 mg | ORAL_TABLET | Freq: Every day | ORAL | 1 refills | Status: DC

## 2020-04-11 NOTE — Patient Instructions (Signed)
Preventive Care 38 Years and Older, Female Preventive care refers to lifestyle choices and visits with your health care provider that can promote health and wellness. This includes:  A yearly physical exam. This is also called an annual well check.  Regular dental and eye exams.  Immunizations.  Screening for certain conditions.  Healthy lifestyle choices, such as diet and exercise. What can I expect for my preventive care visit? Physical exam Your health care provider will check:  Height and weight. These may be used to calculate body mass index (BMI), which is a measurement that tells if you are at a healthy weight.  Heart rate and blood pressure.  Your skin for abnormal spots. Counseling Your health care provider may ask you questions about:  Alcohol, tobacco, and drug use.  Emotional well-being.  Home and relationship well-being.  Sexual activity.  Eating habits.  History of falls.  Memory and ability to understand (cognition).  Work and work Statistician.  Pregnancy and menstrual history. What immunizations do I need?  Influenza (flu) vaccine  This is recommended every year. Tetanus, diphtheria, and pertussis (Tdap) vaccine  You may need a Td booster every 10 years. Varicella (chickenpox) vaccine  You may need this vaccine if you have not already been vaccinated. Zoster (shingles) vaccine  You may need this after age 33. Pneumococcal conjugate (PCV13) vaccine  One dose is recommended after age 67. Pneumococcal polysaccharide (PPSV23) vaccine  One dose is recommended after age 67. Measles, mumps, and rubella (MMR) vaccine  You may need at least one dose of MMR if you were born in 1957 or later. You may also need a second dose. Meningococcal conjugate (MenACWY) vaccine  You may need this if you have certain conditions. Hepatitis A vaccine  You may need this if you have certain conditions or if you travel or work in places where you may be exposed  to hepatitis A. Hepatitis B vaccine  You may need this if you have certain conditions or if you travel or work in places where you may be exposed to hepatitis B. Haemophilus influenzae type b (Hib) vaccine  You may need this if you have certain conditions. You may receive vaccines as individual doses or as more than one vaccine together in one shot (combination vaccines). Talk with your health care provider about the risks and benefits of combination vaccines. What tests do I need? Blood tests  Lipid and cholesterol levels. These may be checked every 5 years, or more frequently depending on your overall health.  Hepatitis C test.  Hepatitis B test. Screening  Lung cancer screening. You may have this screening every year starting at age 67 if you have a 30-pack-year history of smoking and currently smoke or have quit within the past 15 years.  Colorectal cancer screening. All adults should have this screening starting at age 67 and continuing until age 15. Your health care provider may recommend screening at age 67 if you are at increased risk. You will have tests every 1-10 years, depending on your results and the type of screening test.  Diabetes screening. This is done by checking your blood sugar (glucose) after you have not eaten for a while (fasting). You may have this done every 1-3 years.  Mammogram. This may be done every 1-2 years. Talk with your health care provider about how often you should have regular mammograms.  BRCA-related cancer screening. This may be done if you have a family history of breast, ovarian, tubal, or peritoneal cancers.  Other tests  Sexually transmitted disease (STD) testing.  Bone density scan. This is done to screen for osteoporosis. You may have this done starting at age 67. Follow these instructions at home: Eating and drinking  Eat a diet that includes fresh fruits and vegetables, whole grains, lean protein, and low-fat dairy products. Limit  your intake of foods with high amounts of sugar, saturated fats, and salt.  Take vitamin and mineral supplements as recommended by your health care provider.  Do not drink alcohol if your health care provider tells you not to drink.  If you drink alcohol: ? Limit how much you have to 0-1 drink a day. ? Be aware of how much alcohol is in your drink. In the [place] one drink equals one 12 oz bottle of beer (355 mL), one 5 oz glass of wine (148 mL), or one 1 oz glass of hard liquor (44 mL). Lifestyle  Take daily care of your teeth and gums.  Stay active. Exercise for at least 30 minutes on 5 or more days each week.  Do not use any products that contain nicotine or tobacco, such as cigarettes, e-cigarettes, and chewing tobacco. If you need help quitting, ask your health care provider.  If you are sexually active, practice safe sex. Use a condom or other form of protection in order to prevent STIs (sexually transmitted infections).  Talk with your health care provider about taking a low-dose aspirin or statin. What's next?  Go to your health care provider once a year for a well check visit.  Ask your health care provider how often you should have your eyes and teeth checked.  Stay up to date on all vaccines. This information is not intended to replace advice given to you by your health care provider. Make sure you discuss any questions you have with your health care provider. Document Revised: 04/10/2018 Document Reviewed: 04/10/2018 Elsevier Patient Education  2020 [employer].

## 2020-04-11 NOTE — Telephone Encounter (Signed)
Patient needs a refill on Synyhroid, she said the pharmacy denied it. [facility] on [address]. Thanks

## 2020-04-11 NOTE — Progress Notes (Signed)
Subjective:   Deborah Petersen is a 69 y.o. female who presents for Medicare Annual (Subsequent) preventive examination.  Review of Systems    General:   No F/C, wt loss Pulm:   No DIB, SOB, pleuritic chest pain Card:  No CP, palpitations Abd:  No n/v/d or pain Ext:  No inc edema from baseline    Objective:    Today's Vitals   04/11/20 1002  BP: 115/75  Pulse: 77  SpO2: 98%  Weight: 180 lb 4.8 oz (81.8 kg)  Height: 5\' 2"  (1.575 m)   Body mass index is 32.98 kg/m.  Advanced Directives 12/07/2018 12/07/2018 10/03/2016 06/02/2015 11/12/2011  Does Patient Have a Medical Advance Directive? No No No No Patient does not have advance directive;Patient would not like information  Would patient like information on creating a medical advance directive? No - Patient declined - Yes (ED - Information included in AVS) No - patient declined information -    Current Medications (verified) Outpatient Encounter Medications as of 04/11/2020  Medication Sig  . aspirin EC 81 MG tablet Take 81 mg by mouth daily.  . chlorthalidone (HYGROTON) 25 MG tablet Take 1 tablet (25 mg total) by mouth daily. **NEEDS APT FOR FURTHER REFILLS**  . Cholecalciferol (VITAMIN D3) 5000 units TABS 5,000 IU OTC vitamin D3 daily.  . COD LIVER OIL PO Take 1 capsule by mouth every morning.   Marland Kitchen levothyroxine (SYNTHROID) 112 MCG tablet Take 1 tablet (112 mcg total) by mouth daily.  . Turmeric 500 MG TABS Take 1 tablet by mouth daily.   Marland Kitchen buPROPion (WELLBUTRIN SR) 150 MG 12 hr tablet TAKE 1 TABLET(150 MG) BY MOUTH DAILY (Patient not taking: No sig reported)  . dicyclomine (BENTYL) 20 MG tablet Take 1 tablet (20 mg total) by mouth 2 (two) times daily. (Patient not taking: No sig reported)  . escitalopram (LEXAPRO) 10 MG tablet TAKE 1 TABLET(10 MG) BY MOUTH DAILY (Patient not taking: Reported on 04/11/2020)  . Iron-FA-B Cmp-C-Biot-Probiotic (FUSION PLUS) CAPS TAKE 1 CAPSULE BY MOUTH DAILY (Patient not taking: Reported on  04/11/2020)  . omeprazole (PRILOSEC) 20 MG capsule Take 1 capsule (20 mg total) by mouth daily. (Patient not taking: No sig reported)  . Semaglutide-Weight Management (WEGOVY) 0.25 MG/0.5ML SOAJ Inject 0.25 mg into the skin once a week. (Patient not taking: Reported on 04/11/2020)   No facility-administered encounter medications on file as of 04/11/2020.    Allergies (verified) Almond (diagnostic), Fruit & vegetable daily [nutritional supplements], Other, and Erythromycin   History: Past Medical History:  Diagnosis Date  . ADHD   . Allergy    Phreesia 04/08/2020  . Anemia   . Brain tumor ([facility]) 2005  . Dental crowns present   . Depression    Phreesia 04/08/2020  . Food allergy   . Gallbladder problem   . Hypothyroidism   . Macromastia 10/2011  . Rheumatoid arthritis(714.0)    no current problems or meds.  . Thyroid disease    Phreesia 04/08/2020  . Vitamin D deficiency    Past Surgical History:  Procedure Laterality Date  . ABDOMINAL HYSTERECTOMY     partial  . ANTERIOR AND POSTERIOR REPAIR  2001  . APPENDECTOMY    . BRAIN MENINGIOMA EXCISION  2004  . BRAIN SURGERY N/A    Phreesia 04/08/2020  . BREAST REDUCTION SURGERY  11/19/2011   Procedure: MAMMARY REDUCTION  (BREAST);  Surgeon: Cristine Polio, MD;  Location: [facility];  Service: Plastics;  Laterality: Bilateral;  .  BREAST SURGERY N/A    Phreesia 04/08/2020  . CHOLECYSTECTOMY N/A 12/08/2018   Procedure: LAPAROSCOPIC CHOLECYSTECTOMY;  Surgeon: Coralie Keens, MD;  Location: [facility];  Service: General;  Laterality: N/A;  . EYE SURGERY N/A    Phreesia 04/08/2020  . FOOT SURGERY    . HERNIA REPAIR    . KNEE ARTHROSCOPY Left 06/09/2015   Procedure: LEFT KNEE ARTHROSCOPY WITH PARTIAL MEDIAL AND LATERAL MENISECTOMY AND DEBRIDEMENT;  Surgeon: Susa Day, MD;  Location: [facility];  Service: Orthopedics;  Laterality: Left;  . KNEE SURGERY    . TONSILLECTOMY    . TUBAL LIGATION N/A    Phreesia 04/08/2020    Family History  Problem Relation Age of Onset  . Alcoholism Father    Social History   Socioeconomic History  . Marital status: Married    Spouse name: Shelie Lansing  . Number of children: Not on file  . Years of education: Not on file  . Highest education level: Not on file  Occupational History  . Occupation: Retired  Tobacco Use  . Smoking status: Former Smoker    Packs/day: 3.00    Types: Cigarettes    Start date: 05/01/1971    Quit date: 04/30/1976    Years since quitting: 43.9  . Smokeless tobacco: Never Used  Vaping Use  . Vaping Use: Never used  Substance and Sexual Activity  . Alcohol use: No  . Drug use: No  . Sexual activity: Not on file  Other Topics Concern  . Not on file  Social History Narrative  . Not on file   Social Determinants of Health   Financial Resource Strain: Not on file  Food Insecurity: Not on file  Transportation Needs: Not on file  Physical Activity: Not on file  Stress: Not on file  Social Connections: Not on file    Tobacco Counseling Counseling given: Not Answered    Diabetic?No         Activities of Daily Living In your present state of health, do you have any difficulty performing the following activities: 04/11/2020 02/29/2020  Hearing? Tempie Donning  Vision? N Y  Difficulty concentrating or making decisions? N Y  Walking or climbing stairs? N N  Dressing or bathing? N N  Doing errands, shopping? N N  Some recent data might be hidden    Patient Care Team: [name], PA-C as PCP - General (Physician Assistant) Carol Ada, MD as Consulting Physician (Gastroenterology) Susa Day, MD as Consulting Physician (Orthopedic Surgery) Landis Martins, DPM as Consulting Physician (Podiatry)  Indicate any recent Medical Services you may have received from other than [facility] providers in the past year (date may be approximate).     Assessment:   This is a routine wellness examination for Deborah Petersen.  Hearing/Vision  screen No exam data present  Dietary issues and exercise activities discussed:  -Continue to stay active and follow a heart healthy diet. Continue with dietary iron.  Goals   None    Depression Screen PHQ 2/9 Scores 04/11/2020 02/29/2020 09/15/2019 07/02/2019 01/23/2019 12/23/2018 04/17/2018  PHQ - 2 Score 1 0 0 0 0 0 0  PHQ- 9 Score 5 0 3 0 0 0 0    Fall Risk Fall Risk  04/11/2020 02/29/2020 01/23/2019 12/17/2017 07/25/2017  Falls in the past year? 0 0 1 No No  Number falls in past yr: - - 1 - -  Injury with Fall? - - 0 - -  Risk for fall due to : - - Impaired  mobility;Impaired balance/gait;Impaired vision;History of fall(s) - -  Follow up Falls evaluation completed Falls evaluation completed Falls prevention discussed - -    FALL RISK PREVENTION PERTAINING TO THE HOME:  Any stairs in or around the home? Yes  If so, are there any without handrails? Yes  Home free of loose throw rugs in walkways, pet beds, electrical cords, etc? No  Adequate lighting in your home to reduce risk of falls? Yes   ASSISTIVE DEVICES UTILIZED TO PREVENT FALLS:  Life alert? No  Use of a cane, walker or w/c? No  Grab bars in the bathroom? No  Shower chair or bench in shower? No  Elevated toilet seat or a handicapped toilet? No   TIMED UP AND GO:  Was the test performed? Yes .  Length of time to ambulate 10 feet:  8 sec.   Gait steady and fast without use of assistive device  Cognitive Function: wnl     6CIT Screen 04/11/2020 01/23/2019  What Year? 0 points 0 points  What month? 0 points 0 points  What time? 0 points 0 points  Count back from 20 0 points 0 points  Months in reverse 2 points 0 points  Repeat phrase 0 points 0 points  Total Score 2 0    Immunizations Immunization History  Administered Date(s) Administered  . Influenza,inj,Quad PF,6+ Mos 02/12/2017  . Tdap 02/12/2017  . Zoster Recombinat (Shingrix) 12/17/2017, 04/17/2018    TDAP status: Up to date  Flu Vaccine status:  Due, Education has been provided regarding the importance of this vaccine. Advised may receive this vaccine at local pharmacy or Health Dept. Aware to provide a copy of the vaccination record if obtained from local pharmacy or Health Dept. Verbalized acceptance and understanding. Pt declined.  Pneumococcal vaccine status: Due, Education has been provided regarding the importance of this vaccine. Advised may receive this vaccine at local pharmacy or Health Dept. Aware to provide a copy of the vaccination record if obtained from local pharmacy or Health Dept. Verbalized acceptance and understanding.  Covid-19 vaccine status: Declined, Education has been provided regarding the importance of this vaccine but patient still declined. Advised may receive this vaccine at local pharmacy or Health Dept.or vaccine clinic. Aware to provide a copy of the vaccination record if obtained from local pharmacy or Health Dept. Verbalized acceptance and understanding.  Qualifies for Shingles Vaccine? Yes   Zostavax completed Yes   Shingrix Completed?: Yes  Screening Tests Health Maintenance  Topic Date Due  . COVID-19 Vaccine (1) Never done  . PNA vac Low Risk Adult (1 of 2 - PCV13) Never done  . INFLUENZA VACCINE  11/29/2019  . MAMMOGRAM  10/28/2025 (Originally 09/10/2015)  . COLONOSCOPY  07/21/2025  . TETANUS/TDAP  02/13/2027  . DEXA SCAN  Completed  . Hepatitis C Screening  Completed    Health Maintenance  Health Maintenance Due  Topic Date Due  . COVID-19 Vaccine (1) Never done  . PNA vac Low Risk Adult (1 of 2 - PCV13) Never done  . INFLUENZA VACCINE  11/29/2019    Colorectal cancer screening: Type of screening: Colonoscopy. Completed 07/22/2015. Repeat every 10 years  Mammogram status: Ordered PATIENT DECLINED. Pt provided with contact info and advised to call to schedule appt.   DEXA: Completed 05/26/2019  Lung Cancer Screening: (Low Dose CT Chest recommended if Age 46-80 years, 30 pack-year  currently smoking OR have quit w/in 15years.) does not qualify.   Lung Cancer Screening Referral:   Additional Screening:  Hepatitis C Screening: does qualify; Completed 07/25/2017  Vision Screening: Recommended annual ophthalmology exams for early detection of glaucoma and other disorders of the eye. Is the patient up to date with their annual eye exam?  Yes  Who is the provider or what is the name of the office in which the patient attends annual eye exams?  If pt is not established with a provider, would they like to be referred to a provider to establish care? No Dental Screening: Recommended annual dental exams for proper oral hygiene  Community Resource Referral / Chronic Care Management: CRR required this visit?  No   CCM required this visit?  No      Plan:  -Continue current medication regimen (verified w/ pt medications). Provided refills. -Patient is non-fasting so will collect FBW next visit. Levothyroxine dose adjusted 6 weeks ago so will repeat thyroid labs today. -Follow up in 4 months for HTN, thyroid, anemia and FBW  I have personally reviewed and noted the following in the patient's chart:   . Medical and social history . Use of alcohol, tobacco or illicit drugs  . Current medications and supplements . Functional ability and status . Nutritional status . Physical activity . Advanced directives . List of other physicians . Hospitalizations, surgeries, and ER visits in previous 12 months . Vitals . Screenings to include cognitive, depression, and falls . Referrals and appointments  In addition, I have reviewed and discussed with patient certain preventive protocols, quality metrics, and best practice recommendations. A written personalized care plan for preventive services as well as general preventive health recommendations were provided to patient.

## 2020-04-12 ENCOUNTER — Telehealth: Payer: Self-pay | Admitting: Physician Assistant

## 2020-04-12 DIAGNOSIS — E039 Hypothyroidism, unspecified: Secondary | ICD-10-CM

## 2020-04-12 LAB — TSH: TSH: 0.318 u[IU]/mL — ABNORMAL LOW (ref 0.450–4.500)

## 2020-04-12 LAB — T4, FREE: Free T4: 1.68 ng/dL (ref 0.82–1.77)

## 2020-04-12 LAB — T3: T3, Total: 115 ng/dL (ref 71–180)

## 2020-04-12 MED ORDER — LEVOTHYROXINE SODIUM 100 MCG PO TABS: ORAL_TABLET | ORAL | 0 refills | Status: DC

## 2020-04-12 MED ORDER — LEVOTHYROXINE SODIUM 112 MCG PO TABS: ORAL_TABLET | ORAL | 0 refills | Status: DC

## 2020-04-12 NOTE — Telephone Encounter (Signed)
-----   Message from [name], [place] sent at 04/12/2020 12:47 PM EST ----- Please call Ms. Daughenbaugh and notify lab results. TSH is low and thyroid hormones (free T4 and T3) are within normal limits. Recommend starting Levothyroxine 100 mcg to take twice weekly (Tues. and Thurs.) and continue Levothyroxine 112 mcg daily except Tuesday, Thursday. Patient's TSH fluctuates from low and high which is why I recommend being on 2 different doses. If patient is agreeable will send rx and repeat thyroid labs in 6 weeks.  Thank you, [name]

## 2020-04-13 ENCOUNTER — Encounter: Payer: Medicare PPO | Admitting: Physician Assistant

## 2020-05-08 ENCOUNTER — Other Ambulatory Visit: Payer: Self-pay | Admitting: Physician Assistant

## 2020-05-08 DIAGNOSIS — E039 Hypothyroidism, unspecified: Secondary | ICD-10-CM

## 2020-05-08 DIAGNOSIS — Z1152 Encounter for screening for COVID-19: Secondary | ICD-10-CM | POA: Diagnosis not present

## 2020-05-09 ENCOUNTER — Telehealth: Payer: Self-pay | Admitting: Physician Assistant

## 2020-05-09 ENCOUNTER — Encounter: Payer: Self-pay | Admitting: Physician Assistant

## 2020-05-09 NOTE — Telephone Encounter (Signed)
Patient tested positive for COVID and would like to know how long to quarantine and when she can go back to work. She will also need a note for work, which I can get her when she is no longer contagious and she can pick it up. She also only received 19 pills for her hygroton and would like to know why she did not receive a 90 day supply. Thanks

## 2020-05-09 NOTE — Telephone Encounter (Signed)
chlorthalidone (HYGROTON) 25 MG tablet [[id number]]    Order Details Dose: 25 mg Route: Oral Frequency: Daily  Dispense Quantity: 90 tablet Refills: 1        Sig: Take 1 tablet (25 mg total) by mouth daily.       Start Date: 04/11/20 End Date: --  Written Date: 04/11/20 Expiration Date: 04/11/21     Diagnosis Association: Hypertension, unspecified type (I10)  Original Order:  chlorthalidone (HYGROTON) 25 MG tablet [[id number]]      Order Questions  Question Answer Comment  Supervising Provider Beatrice Lecher D        Providers  Authorizing Provider:   [name], PA-C  [place], [place] [address]  Phone:  [phone]   Fax:  [phone]  DEA #:  [id number]   NPI:  [id number] Supervising Provider:   Hali Marry, [address] [place], [place] [address]  Phone:  [phone]   Fax:  [phone]  DEA #:  [id number]   NPI:  [id number]     Ordering User:  [name], PA-C         Pharmacy  [facility] [phone] - [place], [place] - [address] AT [address]  [place], [place] [address]  Phone:  [phone]  Fax:  [phone]  DEA #:  [id number]  [place] Reason: --

## 2020-05-09 NOTE — Telephone Encounter (Signed)
Patient advised Hygroton sent to pharmacy 04/11/20 and advised to contact pharmacy to inquire issue.   Patient positive via home test kit and is requesting a note stating that she can end quarantine on Wednesday per CDC recommendation to quarantine x 5 days. Patient advised to send mychart message with covid positive results. Patient is currently awaiting PCR results. AS, CMA

## 2020-05-10 ENCOUNTER — Encounter: Payer: Self-pay | Admitting: Physician Assistant

## 2020-05-10 ENCOUNTER — Ambulatory Visit: Payer: Medicare PPO | Admitting: Physician Assistant

## 2020-05-10 ENCOUNTER — Other Ambulatory Visit: Payer: Self-pay

## 2020-05-10 VITALS — Ht 61.0 in | Wt 180.0 lb

## 2020-05-10 DIAGNOSIS — U071 COVID-19: Secondary | ICD-10-CM | POA: Diagnosis not present

## 2020-05-10 NOTE — Progress Notes (Signed)
Telehealth office visit note for Deborah Reid, PA-C- at Primary Care at [facility]   I connected with current patient today by telephone and verified that I am speaking with the correct person   . Location of the patient: Home . Location of the provider: Office - This visit type was conducted due to national recommendations for restrictions regarding the COVID-19 Pandemic (e.g. social distancing) in an effort to limit this patient's exposure and mitigate transmission in our community.    - No physical exam could be performed with this format, beyond that communicated to [place] by the patient/ family members as noted.   - Additionally my office staff/ schedulers were to discuss with the patient that there may be a monetary charge related to this service, depending on their medical insurance.  My understanding is that patient understood and consented to proceed.     _________________________________________________________________________________   History of Present Illness: Patient calls in to follow up on Covid-19 infection. Patient reports last Thursday night (5 days ago) had a low grade fever and the next day developed more symptoms of headache, congestion, feeling achy and chills. Did two at home tests which resulted positive so went for an official Covid test at [facility] on Sunday (2 days go) which resulted positive. States symptoms have improved and only has mild head congestion. No fever since Friday (4 days ago). Denies cough, shortness of breath, chest pain or palpations. Is fully vaccinated including booster against Covid-19. Is requesting work note.     No flowsheet data found.  Depression screen [facility] 2/9 04/11/2020 02/29/2020 09/15/2019 07/02/2019 01/23/2019  Decreased Interest 0 0 0 0 0  Down, Depressed, Hopeless 1 0 0 0 0  PHQ - 2 Score 1 0 0 0 0  Altered sleeping 0 0 0 0 0  Tired, decreased energy 1 0 0 0 0  Change in appetite 0 0 0 0 0  Feeling bad or failure about  yourself  0 0 0 0 0  Trouble concentrating 0 0 3 0 0  Moving slowly or fidgety/restless 3 0 0 0 0  Suicidal thoughts 0 0 0 0 0  PHQ-9 Score 5 0 3 0 0  Difficult doing work/chores Not difficult at all - Not difficult at all - -  Some recent data might be hidden      Impression and Recommendations:     1. COVID-19 virus infection   -Symptoms have improved and no fever x 4 days.  -Discussed with patient latest CDC guidelines and should isolate for another day for 5 day mark since symptoms started and is able to return to work on 05/13/2019 as long as no fever develops in the interim. Will provide work note. -Recommend to continue with wearing a mask and home supportive care.    - As part of my medical decision making, I reviewed the following data within the [facility] History obtained from pt /family, CMA notes reviewed and incorporated if applicable, Labs reviewed, Radiograph/ tests reviewed if applicable and OV notes from prior OV's with me, as well as any other specialists she/he has seen since seeing me last, were all reviewed and used in my medical decision making process today.    - Additionally, when appropriate, discussion had with patient regarding our treatment plan, and their biases/concerns about that plan were used in my medical decision making today.    - The patient agreed with the plan and demonstrated an understanding  of the instructions.   No barriers to understanding were identified.     - The patient was advised to call back or seek an in-person evaluation if the symptoms worsen or if the condition fails to improve as anticipated.   Return if symptoms worsen or fail to improve.    No orders of the defined types were placed in this encounter.   No orders of the defined types were placed in this encounter.   There are no discontinued medications.     Time spent on visit including pre-visit chart review and post-visit care was 10  minutes.      The [place] was signed into law in 2016 which includes the topic of electronic health records.  This provides immediate access to information in MyChart.  This includes consultation notes, operative notes, office notes, lab results and pathology reports.  If you have any questions about what you read please let us know at your next visit or call us at the office.  We are right here with you.  Note:  This note was prepared with assistance of Dragon voice recognition software. Occasional wrong-word or sound-a-like substitutions may have occurred due to the inherent limitations of voice recognition software.   __________________________________________________________________________________     Patient Care Team    Relationship Specialty Notifications Start End  Deborah Petersen, [place] PCP - General Physician Assistant  04/11/20   Carol Ada, MD Consulting Physician Gastroenterology  10/03/16   Susa Day, MD Consulting Physician Orthopedic Surgery  10/03/16   Landis Martins, [place] Consulting Physician Podiatry  12/17/17      -Vitals obtained; medications/ allergies reconciled;  personal medical, social, Sx etc.histories were updated by CMA, reviewed by me and are reflected in chart   Patient Active Problem List   Diagnosis Date Noted  . Prediabetes 02/02/2020  . Decreased GFR 02/02/2020  . Depression 12/17/2019  . Class 1 obesity with serious comorbidity and body mass index (BMI) of 32.0 to 32.9 in adult 12/17/2019  . S/P bariatric surgery 07/02/2019  . Cold intolerance 07/02/2019  . Hearing loss of right ear 01/23/2019  . Acute cholecystitis 12/07/2018  . Febrile illness 07/20/2018  . History of abdominal hernia W MESH REPAIR 2003 04/17/2018  . Itching with irritation- abdominal wall overlaying mesh 04/17/2018  . Hypertension 12/17/2017  . Chronic fatigue 12/17/2017  . Status post R bunionectomy- 4/19 12/17/2017  . Laryngopharyngeal reflux (LPR)  08/22/2017  . Acute pharyngitis 08/22/2017  . h/o Elevated LDL cholesterol level 07/25/2017  . Cervical pain (neck) 02/12/2017  . Hypothyroidism 10/03/2016  . Cyst of meniscus of left knee- s/p sx Dr Tonita Cong- [facility] 10/03/2016  . S/P hysterectomy- no cervix 10/03/2016  . History of non anemic vitamin B12 deficiency 10/03/2016  . BMI 35.0-35.9,adult 10/03/2016  . h/o RA (rheumatoid arthritis) - cured by faith and prayer   . Dental crowns present   . 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 Vitamin D deficiency   . Fe Def Anemia   . Macromastia 10/29/2011  . Other bacterial meningitis 05/01/2003     Current Meds  Medication Sig  . aspirin EC 81 MG tablet Take 81 mg by mouth daily.  . chlorthalidone (HYGROTON) 25 MG tablet Take 1 tablet (25 mg total) by mouth daily.  . Cholecalciferol (VITAMIN D3) 5000 units TABS 5,000 IU OTC vitamin D3 daily.  . COD LIVER OIL PO Take 1 capsule by mouth every morning.   Marland Kitchen  levothyroxine (SYNTHROID) 100 MCG tablet Take 1 by mouth on Tuesday and Thursday only  . levothyroxine (SYNTHROID) 112 MCG tablet TAKE 1 TABLET(112 MCG) BY MOUTH DAILY  . Turmeric 500 MG TABS Take 1 tablet by mouth daily.      Allergies:  Allergies  Allergen Reactions  . Almond (Diagnostic)   . Fruit & Vegetable Daily [Nutritional Supplements] Swelling    Allergic to all fresh fruit  . Other   . Erythromycin Nausea And Vomiting     ROS:  See above HPI for pertinent positives and negatives   Objective:   Height 5\' 1"  (1.549 m), weight 180 lb (81.6 kg).  (if some vitals are omitted, this means that patient was UNABLE to obtain them even though they were asked to get them prior to OV today.  They were asked to call us at their earliest convenience with these once obtained. ) General: A & O * 3; sounds in no acute distress  Respiratory: speaking in full sentences, no conversational dyspnea Psych: insight appears good, mood- appears full

## 2020-05-11 ENCOUNTER — Other Ambulatory Visit: Payer: Self-pay | Admitting: Physician Assistant

## 2020-05-11 DIAGNOSIS — E039 Hypothyroidism, unspecified: Secondary | ICD-10-CM

## 2020-05-20 DIAGNOSIS — F900 Attention-deficit hyperactivity disorder, predominantly inattentive type: Secondary | ICD-10-CM | POA: Diagnosis not present

## 2020-05-20 DIAGNOSIS — Z7982 Long term (current) use of aspirin: Secondary | ICD-10-CM | POA: Diagnosis not present

## 2020-05-20 DIAGNOSIS — Z6834 Body mass index (BMI) 34.0-34.9, adult: Secondary | ICD-10-CM | POA: Diagnosis not present

## 2020-05-20 DIAGNOSIS — I10 Essential (primary) hypertension: Secondary | ICD-10-CM | POA: Diagnosis not present

## 2020-05-20 DIAGNOSIS — E039 Hypothyroidism, unspecified: Secondary | ICD-10-CM | POA: Diagnosis not present

## 2020-05-20 DIAGNOSIS — Z87891 Personal history of nicotine dependence: Secondary | ICD-10-CM | POA: Diagnosis not present

## 2020-05-20 DIAGNOSIS — E669 Obesity, unspecified: Secondary | ICD-10-CM | POA: Diagnosis not present

## 2020-05-22 ENCOUNTER — Other Ambulatory Visit: Payer: Self-pay | Admitting: Physician Assistant

## 2020-05-22 DIAGNOSIS — E039 Hypothyroidism, unspecified: Secondary | ICD-10-CM

## 2020-05-26 ENCOUNTER — Other Ambulatory Visit: Payer: Medicare PPO

## 2020-05-26 ENCOUNTER — Other Ambulatory Visit: Payer: Self-pay

## 2020-05-26 DIAGNOSIS — E039 Hypothyroidism, unspecified: Secondary | ICD-10-CM | POA: Diagnosis not present

## 2020-05-27 ENCOUNTER — Telehealth: Payer: Self-pay | Admitting: Physician Assistant

## 2020-05-27 LAB — TSH: TSH: 0.106 u[IU]/mL — ABNORMAL LOW (ref 0.450–4.500)

## 2020-05-27 LAB — T3: T3, Total: 117 ng/dL (ref 71–180)

## 2020-05-27 LAB — T4, FREE: Free T4: 1.58 ng/dL (ref 0.82–1.77)

## 2020-05-27 NOTE — Telephone Encounter (Signed)
-----   Message from [name], [place] sent at 05/27/2020 12:46 PM EST ----- Please call Deborah Petersen and notify TSH continues to be low, free T4 and T3 hormones are normal. Recommend taking levothyroxine 100 mcg on Tuesday, Thursday, and Saturday. Take levothyroxine 112 mcg the other days. Recommend to schedule lab visit in 6 weeks to repeat thyroid labs.  Thank you, [name]

## 2020-07-08 ENCOUNTER — Other Ambulatory Visit: Payer: Self-pay | Admitting: Family Medicine

## 2020-07-08 DIAGNOSIS — I10 Essential (primary) hypertension: Secondary | ICD-10-CM

## 2020-07-18 ENCOUNTER — Other Ambulatory Visit: Payer: Self-pay | Admitting: Physician Assistant

## 2020-07-18 DIAGNOSIS — D509 Iron deficiency anemia, unspecified: Secondary | ICD-10-CM

## 2020-07-22 ENCOUNTER — Telehealth: Payer: Self-pay | Admitting: Physician Assistant

## 2020-07-27 ENCOUNTER — Other Ambulatory Visit: Payer: Self-pay

## 2020-07-27 ENCOUNTER — Encounter: Payer: Self-pay | Admitting: Physician Assistant

## 2020-07-27 ENCOUNTER — Ambulatory Visit (INDEPENDENT_AMBULATORY_CARE_PROVIDER_SITE_OTHER): Payer: Medicare PPO | Admitting: Physician Assistant

## 2020-07-27 VITALS — BP 122/77 | HR 80 | Temp 97.5°F | Ht 61.0 in | Wt 194.7 lb

## 2020-07-27 DIAGNOSIS — E039 Hypothyroidism, unspecified: Secondary | ICD-10-CM

## 2020-07-27 DIAGNOSIS — D509 Iron deficiency anemia, unspecified: Secondary | ICD-10-CM

## 2020-07-27 DIAGNOSIS — Z6836 Body mass index (BMI) 36.0-36.9, adult: Secondary | ICD-10-CM

## 2020-07-27 MED ORDER — FUSION PLUS PO CAPS: 1.0000 | ORAL_CAPSULE | Freq: Every day | ORAL | 0 refills | Status: DC

## 2020-07-27 MED ORDER — PHENTERMINE-TOPIRAMATE ER 3.75-23 MG PO CP24: 1.0000 | ORAL_CAPSULE | Freq: Every day | ORAL | 0 refills | Status: AC

## 2020-07-27 NOTE — Progress Notes (Signed)
Established Patient Office Visit  Subjective:  Patient ID: Deborah Petersen, female    DOB: May 31, 1950  Age: 70 y.o. MRN: [id number]  CC:  Chief Complaint  Patient presents with  . Follow-up  . Medication Refill    Fusion Plus    HPI Deborah Petersen presents for follow up on iron deficiency anemia and hypothyroidism.  Iron deficiency: Requesting refill of fusion plus capsule for anemia. Tried OTC iron supplement which she did not tolerate well. Denies melena, hematochezia or fatigue.  Hypothyroidism: Reports medication compliance. Denies heat/cold intolerance, palpitations, bowel or weight changes.  Weight: Patient previously went to [facility] and has been trying to follow diet recommendations but has not been successful due to dental issues and is waiting for dental implants. Trying to stay active. States is having trouble with increased appetite and portion control. Was not able to afford Saxenda. Has tried other medications in the past but unable to recall which meds.  Past Medical History:  Diagnosis Date  . ADHD   . Allergy    Phreesia 04/08/2020  . Anemia   . Brain tumor ([place]) 2005  . Dental crowns present   . Depression    Phreesia 04/08/2020  . Food allergy   . Gallbladder problem   . Hypothyroidism   . Macromastia 10/2011  . Rheumatoid arthritis(714.0)    no current problems or meds.  . Thyroid disease    Phreesia 04/08/2020  . Vitamin D deficiency     Past Surgical History:  Procedure Laterality Date  . ABDOMINAL HYSTERECTOMY     partial  . ANTERIOR AND POSTERIOR REPAIR  2001  . APPENDECTOMY    . BRAIN MENINGIOMA EXCISION  2004  . BRAIN SURGERY N/A    Phreesia 04/08/2020  . BREAST REDUCTION SURGERY  11/19/2011   Procedure: MAMMARY REDUCTION  (BREAST);  Surgeon: Cristine Polio, MD;  Location: [facility];  Service: Plastics;  Laterality: Bilateral;  . BREAST SURGERY N/A    Phreesia 04/08/2020  . CHOLECYSTECTOMY N/A  12/08/2018   Procedure: LAPAROSCOPIC CHOLECYSTECTOMY;  Surgeon: Coralie Keens, MD;  Location: [facility];  Service: General;  Laterality: N/A;  . EYE SURGERY N/A    Phreesia 04/08/2020  . FOOT SURGERY    . HERNIA REPAIR    . KNEE ARTHROSCOPY Left 06/09/2015   Procedure: LEFT KNEE ARTHROSCOPY WITH PARTIAL MEDIAL AND LATERAL MENISECTOMY AND DEBRIDEMENT;  Surgeon: Susa Day, MD;  Location: [facility];  Service: Orthopedics;  Laterality: Left;  . KNEE SURGERY    . TONSILLECTOMY    . TUBAL LIGATION N/A    Phreesia 04/08/2020    Family History  Problem Relation Age of Onset  . Alcoholism Father     Social History   Socioeconomic History  . Marital status: Married    Spouse name: Haily Caley  . Number of children: Not on file  . Years of education: Not on file  . Highest education level: Not on file  Occupational History  . Occupation: Retired  Tobacco Use  . Smoking status: Former Smoker    Packs/day: 3.00    Types: Cigarettes    Start date: 05/01/1971    Quit date: 04/30/1976    Years since quitting: 44.2  . Smokeless tobacco: Never Used  Vaping Use  . Vaping Use: Never used  Substance and Sexual Activity  . Alcohol use: No  . Drug use: No  . Sexual activity: Not on file  Other Topics Concern  . Not  on file  Social History Narrative  . Not on file   Social Determinants of Health   Financial Resource Strain: Not on file  Food Insecurity: Not on file  Transportation Needs: Not on file  Physical Activity: Not on file  Stress: Not on file  Social Connections: Not on file  Intimate Partner Violence: Not on file    Outpatient Medications Prior to Visit  Medication Sig Dispense Refill  . aspirin EC 81 MG tablet Take 81 mg by mouth daily.    . chlorthalidone (HYGROTON) 25 MG tablet Take 1 tablet (25 mg total) by mouth daily. 90 tablet 1  . Cholecalciferol (VITAMIN D3) 5000 units TABS 5,000 IU OTC vitamin D3 daily. 90 tablet 3  . COD LIVER OIL PO Take 1 capsule by mouth  every morning.     Marland Kitchen levothyroxine (SYNTHROID) 100 MCG tablet Take 1 by mouth on Tuesday and Thursday only 45 tablet 0  . levothyroxine (SYNTHROID) 112 MCG tablet TAKE 1 TABLET(112 MCG) BY MOUTH DAILY 90 tablet 0  . Turmeric 500 MG TABS Take 1 tablet by mouth daily.     . Iron-FA-B Cmp-C-Biot-Probiotic (FUSION PLUS PO) Take by mouth.     No facility-administered medications prior to visit.    Allergies  Allergen Reactions  . Almond (Diagnostic)   . Fruit & Vegetable Daily [Nutritional Supplements] Swelling    Allergic to all fresh fruit  . Other   . Erythromycin Nausea And Vomiting    ROS Review of Systems A fourteen system review of systems was performed and found to be positive as per HPI.   Objective:    Physical Exam General:  Well Developed, well nourished, in no acute distress   Neuro:  Alert and oriented,  extra-ocular muscles intact  HEENT:  Normocephalic, atraumatic, neck supple Skin:  no gross rash, warm, pink. Cardiac:  RRR, S1 S2 Respiratory:  ECTA B/L, Not using accessory muscles, speaking in full sentences- unlabored. Vascular:  Ext warm, no cyanosis apprec.; cap RF less 2 sec. Psych:  No HI/SI, judgement and insight good, Euthymic mood. Full Affect.   BP 122/77   Pulse 80   Temp (!) 97.5 F (36.4 C)   Ht _0  (1.549 m)   Wt 194 lb 11.2 oz (88.3 kg)   SpO2 97%   BMI 36.79 kg/m  Wt Readings from Last 3 Encounters:  07/27/20 194 lb 11.2 oz (88.3 kg)  05/10/20 180 lb (81.6 kg)  04/11/20 180 lb 4.8 oz (81.8 kg)     Health Maintenance Due  Topic Date Due  . PNA vac Low Risk Adult (1 of 2 - PCV13) Never done    There are no preventive care reminders to display for this patient.  Lab Results  Component Value Date   TSH 0.123 (L) 07/27/2020   Lab Results  Component Value Date   WBC 5.3 07/27/2020   HGB 12.6 07/27/2020   HCT 38.5 07/27/2020   MCV 81 07/27/2020   PLT 303 07/27/2020   Lab Results  Component Value Date   NA 133 (L) 07/27/2020    K 4.6 07/27/2020   CO2 25 07/27/2020   GLUCOSE 86 07/27/2020   BUN 12 07/27/2020   CREATININE 0.87 07/27/2020   BILITOT 0.3 07/27/2020   ALKPHOS 108 07/27/2020   AST 22 07/27/2020   ALT 20 07/27/2020   PROT 6.8 07/27/2020   ALBUMIN 4.1 07/27/2020   CALCIUM 9.7 07/27/2020   ANIONGAP 14 01/11/2020   Lab Results  Component Value Date   CHOL 215 (H) 09/15/2019   Lab Results  Component Value Date   HDL 92 09/15/2019   Lab Results  Component Value Date   LDLCALC 105 (H) 09/15/2019   Lab Results  Component Value Date   TRIG 108 09/15/2019   Lab Results  Component Value Date   CHOLHDL 2.4 01/23/2019   Lab Results  Component Value Date   HGBA1C 5.6 01/13/2020      Assessment & Plan:   Problem List Items Addressed This Visit      Endocrine   Hypothyroidism - Primary (Chronic)   Relevant Orders   Comp Met (CMET) (Completed)   TSH (Completed)   T4, free (Completed)   T3 (Completed)     Other   Fe Def Anemia (Chronic)   Relevant Medications   Iron-FA-B Cmp-C-Biot-Probiotic (FUSION PLUS) CAPS   Other Relevant Orders   Comp Met (CMET) (Completed)   CBC w/Diff (Completed)   Iron, TIBC and Ferritin Panel (Completed)    Other Visit Diagnoses    Class 2 severe obesity with serious comorbidity and body mass index (BMI) of 36.0 to 36.9 in adult, unspecified obesity type ([place])       Relevant Medications   Phentermine-Topiramate 3.75-23 MG CP24   Other Relevant Orders   Comp Met (CMET) (Completed)     Iron deficiency anemia: -Last CBC: RBC 5.39 (mildly elevated), hemoglobin 14.7, hematocrit 45.1 -Will repeat CBC and iron panel today.  Provided medication refill for fusion plus capsule.  Hypothyroidism: -Last TSH 0.106, free T4 1.58 and medication regimen was adjusted. Patient is due for repeat labs so will repeat thyroid labs and pending results make medication adjustments.   Class 2 severe obesity with serious comorbidity and body mass index (BMI) of 36.0 to  36.9 in adult, unspecified obesity type: -Associated with hypertension, prediabetes and hyperlipidemia. -Patient has gained 14 pounds since last in-office visit.  Unable to afford Saxenda and not successful with healthy weight and wellness.  Discussed alternative options and will trial Qsymia.  Discussed with patient potential side effects and advised to let me know if unable to tolerate medication.  BP and pulse stable. -Encourage to reduce calories and incorporate smaller meals with intermittent snacks. -Follow-up in 4 weeks for weight management.  Meds ordered this encounter  Medications  . Iron-FA-B Cmp-C-Biot-Probiotic (FUSION PLUS) CAPS    Sig: Take 1 capsule by mouth daily.    Dispense:  90 capsule    Refill:  0    Order Specific Question:   Supervising Provider    Answer:   Beatrice Lecher D [[id number]]  . Phentermine-Topiramate 3.75-23 MG CP24    Sig: Take 1 capsule by mouth daily.    Dispense:  30 capsule    Refill:  0    Order Specific Question:   Supervising Provider    Answer:   Beatrice Lecher D [[id number]]    Follow-up: Return in about 4 weeks (around 08/24/2020) for Wt, HTN.   Note:  This note was prepared with assistance of Dragon voice recognition software. Occasional wrong-word or sound-a-like substitutions may have occurred due to the inherent limitations of voice recognition software.  [name], PA-C

## 2020-07-27 NOTE — Patient Instructions (Signed)
Calorie Counting for Weight Loss Calories are units of energy. Your body needs a certain number of calories from food to keep going throughout the day. When you eat or drink more calories than your body needs, your body stores the extra calories mostly as fat. When you eat or drink fewer calories than your body needs, your body burns fat to get the energy it needs. Calorie counting means keeping track of how many calories you eat and drink each day. Calorie counting can be helpful if you need to lose weight. If you eat fewer calories than your body needs, you should lose weight. Ask your health care provider what a healthy weight is for you. For calorie counting to work, you will need to eat the right number of calories each day to lose a healthy amount of weight per week. A dietitian can help you figure out how many calories you need in a day and will suggest ways to reach your calorie goal.  A healthy amount of weight to lose each week is usually 1-2 lb (0.5-0.9 kg). This usually means that your daily calorie intake should be reduced by 500-750 calories.  Eating 1,200-1,500 calories a day can help most women lose weight.  Eating 1,500-1,800 calories a day can help most men lose weight. What do I need to know about calorie counting? Work with your health care provider or dietitian to determine how many calories you should get each day. To meet your daily calorie goal, you will need to:  Find out how many calories are in each food that you would like to eat. Try to do this before you eat.  Decide how much of the food you plan to eat.  Keep a food log. Do this by writing down what you ate and how many calories it had. To successfully lose weight, it is important to balance calorie counting with a healthy lifestyle that includes regular activity. Where do I find calorie information? The number of calories in a food can be found on a Nutrition Facts label. If a food does not have a Nutrition Facts  label, try to look up the calories online or ask your dietitian for help. Remember that calories are listed per serving. If you choose to have more than one serving of a food, you will have to multiply the calories per serving by the number of servings you plan to eat. For example, the label on a package of bread might say that a serving size is 1 slice and that there are 90 calories in a serving. If you eat 1 slice, you will have eaten 90 calories. If you eat 2 slices, you will have eaten 180 calories.   How do I keep a food log? After each time that you eat, record the following in your food log as soon as possible:  What you ate. Be sure to include toppings, sauces, and other extras on the food.  How much you ate. This can be measured in cups, ounces, or number of items.  How many calories were in each food and drink.  The total number of calories in the food you ate. Keep your food log near you, such as in a pocket-sized notebook or on an app or website on your mobile phone. Some programs will calculate calories for you and show you how many calories you have left to meet your daily goal. What are some portion-control tips?  Know how many calories are in a serving. This will   help you know how many servings you can have of a certain food.  Use a measuring cup to measure serving sizes. You could also try weighing out portions on a kitchen scale. With time, you will be able to estimate serving sizes for some foods.  Take time to put servings of different foods on your favorite plates or in your favorite bowls and cups so you know what a serving looks like.  Try not to eat straight from a food's packaging, such as from a bag or box. Eating straight from the package makes it hard to see how much you are eating and can lead to overeating. Put the amount you would like to eat in a cup or on a plate to make sure you are eating the right portion.  Use smaller plates, glasses, and bowls for smaller  portions and to prevent overeating.  Try not to multitask. For example, avoid watching TV or using your computer while eating. If it is time to eat, sit down at a table and enjoy your food. This will help you recognize when you are full. It will also help you be more mindful of what and how much you are eating. What are tips for following this plan? Reading food labels  Check the calorie count compared with the serving size. The serving size may be smaller than what you are used to eating.  Check the source of the calories. Try to choose foods that are high in protein, fiber, and vitamins, and low in saturated fat, trans fat, and sodium. Shopping  Read nutrition labels while you shop. This will help you make healthy decisions about which foods to buy.  Pay attention to nutrition labels for low-fat or fat-free foods. These foods sometimes have the same number of calories or more calories than the full-fat versions. They also often have added sugar, starch, or salt to make up for flavor that was removed with the fat.  Make a grocery list of lower-calorie foods and stick to it. Cooking  Try to cook your favorite foods in a healthier way. For example, try baking instead of frying.  Use low-fat dairy products. Meal planning  Use more fruits and vegetables. One-half of your plate should be fruits and vegetables.  Include lean proteins, such as chicken, turkey, and fish. Lifestyle Each week, aim to do one of the following:  150 minutes of moderate exercise, such as walking.  75 minutes of vigorous exercise, such as running. General information  Know how many calories are in the foods you eat most often. This will help you calculate calorie counts faster.  Find a way of tracking calories that works for you. Get creative. Try different apps or programs if writing down calories does not work for you. What foods should I eat?  Eat nutritious foods. It is better to have a nutritious,  high-calorie food, such as an avocado, than a food with few nutrients, such as a bag of potato chips.  Use your calories on foods and drinks that will fill you up and will not leave you hungry soon after eating. ? Examples of foods that fill you up are nuts and nut butters, vegetables, lean proteins, and high-fiber foods such as whole grains. High-fiber foods are foods with more than 5 g of fiber per serving.  Pay attention to calories in drinks. Low-calorie drinks include water and unsweetened drinks. The items listed above may not be a complete list of foods and beverages you can eat.   Contact a dietitian for more information.   What foods should I limit? Limit foods or drinks that are not good sources of vitamins, minerals, or protein or that are high in unhealthy fats. These include:  Candy.  Other sweets.  Sodas, specialty coffee drinks, alcohol, and juice. The items listed above may not be a complete list of foods and beverages you should avoid. Contact a dietitian for more information. How do I count calories when eating out?  Pay attention to portions. Often, portions are much larger when eating out. Try these tips to keep portions smaller: ? Consider sharing a meal instead of getting your own. ? If you get your own meal, eat only half of it. Before you start eating, ask for a container and put half of your meal into it. ? When available, consider ordering smaller portions from the menu instead of full portions.  Pay attention to your food and drink choices. Knowing the way food is cooked and what is included with the meal can help you eat fewer calories. ? If calories are listed on the menu, choose the lower-calorie options. ? Choose dishes that include vegetables, fruits, whole grains, low-fat dairy products, and lean proteins. ? Choose items that are boiled, broiled, grilled, or steamed. Avoid items that are buttered, battered, fried, or served with cream sauce. Items labeled as  crispy are usually fried, unless stated otherwise. ? Choose water, low-fat milk, unsweetened iced tea, or other drinks without added sugar. If you want an alcoholic beverage, choose a lower-calorie option, such as a glass of wine or light beer. ? Ask for dressings, sauces, and syrups on the side. These are usually high in calories, so you should limit the amount you eat. ? If you want a salad, choose a garden salad and ask for grilled meats. Avoid extra toppings such as bacon, cheese, or fried items. Ask for the dressing on the side, or ask for olive oil and vinegar or lemon to use as dressing.  Estimate how many servings of a food you are given. Knowing serving sizes will help you be aware of how much food you are eating at restaurants. Where to find more information  Centers for Disease Control and Prevention: www.cdc.gov  [place] Department of Agriculture: myplate.gov Summary  Calorie counting means keeping track of how many calories you eat and drink each day. If you eat fewer calories than your body needs, you should lose weight.  A healthy amount of weight to lose per week is usually 1-2 lb (0.5-0.9 kg). This usually means reducing your daily calorie intake by 500-750 calories.  The number of calories in a food can be found on a Nutrition Facts label. If a food does not have a Nutrition Facts label, try to look up the calories online or ask your dietitian for help.  Use smaller plates, glasses, and bowls for smaller portions and to prevent overeating.  Use your calories on foods and drinks that will fill you up and not leave you hungry shortly after a meal. This information is not intended to replace advice given to you by your health care provider. Make sure you discuss any questions you have with your health care provider. Document Revised: 05/28/2019 Document Reviewed: 05/28/2019 Elsevier Patient Education  2021 [employer]  

## 2020-07-28 LAB — COMPREHENSIVE METABOLIC PANEL
ALT: 20 IU/L (ref 0–32)
AST: 22 IU/L (ref 0–40)
Albumin/Globulin Ratio: 1.5 (ref 1.2–2.2)
Albumin: 4.1 g/dL (ref 3.8–4.8)
Alkaline Phosphatase: 108 IU/L (ref 44–121)
BUN/Creatinine Ratio: 14 (ref 12–28)
BUN: 12 mg/dL (ref 8–27)
Bilirubin Total: 0.3 mg/dL (ref 0.0–1.2)
CO2: 25 mmol/L (ref 20–29)
Calcium: 9.7 mg/dL (ref 8.7–10.3)
Chloride: 94 mmol/L — ABNORMAL LOW (ref 96–106)
Creatinine, Ser: 0.87 mg/dL (ref 0.57–1.00)
Globulin, Total: 2.7 g/dL (ref 1.5–4.5)
Glucose: 86 mg/dL (ref 65–99)
Potassium: 4.6 mmol/L (ref 3.5–5.2)
Sodium: 133 mmol/L — ABNORMAL LOW (ref 134–144)
Total Protein: 6.8 g/dL (ref 6.0–8.5)
eGFR: 73 mL/min/{1.73_m2} (ref >59)

## 2020-07-28 LAB — TSH: TSH: 0.123 u[IU]/mL — ABNORMAL LOW (ref 0.450–4.500)

## 2020-07-28 LAB — CBC WITH DIFFERENTIAL/PLATELET
Basophils Absolute: 0 10*3/uL (ref 0.0–0.2)
Basos: 0 %
EOS (ABSOLUTE): 0.2 10*3/uL (ref 0.0–0.4)
Eos: 4 %
Hematocrit: 38.5 % (ref 34.0–46.6)
Hemoglobin: 12.6 g/dL (ref 11.1–15.9)
Immature Grans (Abs): 0 10*3/uL (ref 0.0–0.1)
Immature Granulocytes: 0 %
Lymphocytes Absolute: 1.6 10*3/uL (ref 0.7–3.1)
Lymphs: 30 %
MCH: 26.6 pg (ref 26.6–33.0)
MCHC: 32.7 g/dL (ref 31.5–35.7)
MCV: 81 fL (ref 79–97)
Monocytes Absolute: 0.5 10*3/uL (ref 0.1–0.9)
Monocytes: 10 %
Neutrophils Absolute: 2.9 10*3/uL (ref 1.4–7.0)
Neutrophils: 56 %
Platelets: 303 10*3/uL (ref 150–450)
RBC: 4.74 x10E6/uL (ref 3.77–5.28)
RDW: 13.9 % (ref 11.7–15.4)
WBC: 5.3 10*3/uL (ref 3.4–10.8)

## 2020-07-28 LAB — IRON,TIBC AND FERRITIN PANEL
Ferritin: 45 ng/mL (ref 15–150)
Iron Saturation: 33 % (ref 15–55)
Iron: 101 ug/dL (ref 27–139)
Total Iron Binding Capacity: 310 ug/dL (ref 250–450)
UIBC: 209 ug/dL (ref 118–369)

## 2020-07-28 LAB — T4, FREE: Free T4: 1.56 ng/dL (ref 0.82–1.77)

## 2020-07-28 LAB — T3: T3, Total: 119 ng/dL (ref 71–180)

## 2020-08-01 NOTE — Addendum Note (Signed)
Addended by: Mickel Crow on: 08/01/2020 02:51 PM   Modules accepted: Orders

## 2020-08-16 ENCOUNTER — Other Ambulatory Visit: Payer: Self-pay | Admitting: Physician Assistant

## 2020-08-17 ENCOUNTER — Other Ambulatory Visit: Payer: Self-pay | Admitting: Physician Assistant

## 2020-08-22 ENCOUNTER — Telehealth: Payer: Self-pay | Admitting: Physician Assistant

## 2020-08-22 NOTE — Telephone Encounter (Signed)
Patient contacted and made aware. 

## 2020-08-22 NOTE — Telephone Encounter (Signed)
Patient would like to inquire if she needs a referral for an appointment placed for an ear nose and throat at [facility] on [address] on may 2 ND. Patient wants to make sure she did not need a referral. Thanks.

## 2020-08-22 NOTE — Telephone Encounter (Signed)
She will need to contact her insurance company to see if they require a referral. AS, CMA

## 2020-08-23 ENCOUNTER — Ambulatory Visit: Payer: Medicare PPO | Admitting: Physician Assistant

## 2020-08-24 NOTE — Telephone Encounter (Signed)
closed

## 2020-08-29 DIAGNOSIS — R930 Abnormal findings on diagnostic imaging of skull and head, not elsewhere classified: Secondary | ICD-10-CM | POA: Diagnosis not present

## 2020-09-10 ENCOUNTER — Other Ambulatory Visit: Payer: Self-pay | Admitting: Physician Assistant

## 2020-09-10 DIAGNOSIS — I10 Essential (primary) hypertension: Secondary | ICD-10-CM

## 2020-09-13 ENCOUNTER — Other Ambulatory Visit: Payer: Self-pay | Admitting: Physician Assistant

## 2020-09-13 DIAGNOSIS — E039 Hypothyroidism, unspecified: Secondary | ICD-10-CM

## 2020-09-14 ENCOUNTER — Other Ambulatory Visit: Payer: Medicare PPO

## 2020-09-14 ENCOUNTER — Other Ambulatory Visit: Payer: Self-pay

## 2020-09-14 DIAGNOSIS — E039 Hypothyroidism, unspecified: Secondary | ICD-10-CM | POA: Diagnosis not present

## 2020-09-15 ENCOUNTER — Other Ambulatory Visit: Payer: Self-pay | Admitting: Physician Assistant

## 2020-09-15 DIAGNOSIS — E038 Other specified hypothyroidism: Secondary | ICD-10-CM

## 2020-09-15 LAB — T4, FREE: Free T4: 1.71 ng/dL (ref 0.82–1.77)

## 2020-09-15 LAB — TSH: TSH: 0.075 u[IU]/mL — ABNORMAL LOW (ref 0.450–4.500)

## 2020-09-15 LAB — T3: T3, Total: 123 ng/dL (ref 71–180)

## 2020-09-15 MED ORDER — LEVOTHYROXINE SODIUM 88 MCG PO TABS: 88.0000 ug | ORAL_TABLET | Freq: Every day | ORAL | 1 refills | Status: DC

## 2020-10-22 ENCOUNTER — Other Ambulatory Visit: Payer: Self-pay | Admitting: Physician Assistant

## 2020-10-22 DIAGNOSIS — E038 Other specified hypothyroidism: Secondary | ICD-10-CM

## 2020-11-01 DIAGNOSIS — L282 Other prurigo: Secondary | ICD-10-CM | POA: Diagnosis not present

## 2020-11-02 DIAGNOSIS — R930 Abnormal findings on diagnostic imaging of skull and head, not elsewhere classified: Secondary | ICD-10-CM | POA: Diagnosis not present

## 2020-11-10 ENCOUNTER — Other Ambulatory Visit: Payer: Self-pay | Admitting: Physician Assistant

## 2020-12-01 ENCOUNTER — Other Ambulatory Visit: Payer: Self-pay | Admitting: Physician Assistant

## 2020-12-01 ENCOUNTER — Telehealth: Payer: Self-pay | Admitting: Physician Assistant

## 2020-12-01 ENCOUNTER — Other Ambulatory Visit: Payer: Self-pay

## 2020-12-01 DIAGNOSIS — D509 Iron deficiency anemia, unspecified: Secondary | ICD-10-CM

## 2020-12-01 DIAGNOSIS — E038 Other specified hypothyroidism: Secondary | ICD-10-CM

## 2020-12-01 MED ORDER — FUSION PLUS PO CAPS: 1.0000 | ORAL_CAPSULE | Freq: Every day | ORAL | 1 refills | Status: DC

## 2020-12-01 MED ORDER — LEVOTHYROXINE SODIUM 88 MCG PO TABS: 88.0000 ug | ORAL_TABLET | Freq: Every day | ORAL | 1 refills | Status: AC

## 2020-12-01 NOTE — Telephone Encounter (Signed)
Patient needs a refill on Fusion Plus, and Synthroid and each for 90 day prescriptions. Patient uses [facility] on [address]. Patient is moving to [place]. Please advise.

## 2020-12-01 NOTE — Telephone Encounter (Signed)
Refills were sent for 90 plus a refill to ensure the patient has enough medication to establish care in [place].

## 2020-12-09 DIAGNOSIS — D225 Melanocytic nevi of trunk: Secondary | ICD-10-CM | POA: Diagnosis not present

## 2020-12-09 DIAGNOSIS — L298 Other pruritus: Secondary | ICD-10-CM | POA: Diagnosis not present

## 2020-12-09 DIAGNOSIS — B078 Other viral warts: Secondary | ICD-10-CM | POA: Diagnosis not present

## 2021-01-19 DIAGNOSIS — E611 Iron deficiency: Secondary | ICD-10-CM | POA: Diagnosis not present

## 2021-01-19 DIAGNOSIS — E039 Hypothyroidism, unspecified: Secondary | ICD-10-CM | POA: Diagnosis not present

## 2021-01-19 DIAGNOSIS — I10 Essential (primary) hypertension: Secondary | ICD-10-CM | POA: Diagnosis not present

## 2021-01-19 DIAGNOSIS — Z86018 Personal history of other benign neoplasm: Secondary | ICD-10-CM | POA: Diagnosis not present

## 2021-01-19 DIAGNOSIS — E559 Vitamin D deficiency, unspecified: Secondary | ICD-10-CM | POA: Diagnosis not present

## 2021-01-19 DIAGNOSIS — F909 Attention-deficit hyperactivity disorder, unspecified type: Secondary | ICD-10-CM | POA: Diagnosis not present

## 2021-01-19 DIAGNOSIS — J329 Chronic sinusitis, unspecified: Secondary | ICD-10-CM | POA: Diagnosis not present

## 2021-01-19 DIAGNOSIS — K089 Disorder of teeth and supporting structures, unspecified: Secondary | ICD-10-CM | POA: Diagnosis not present

## 2021-01-24 DIAGNOSIS — J069 Acute upper respiratory infection, unspecified: Secondary | ICD-10-CM | POA: Diagnosis not present

## 2021-01-24 DIAGNOSIS — R0600 Dyspnea, unspecified: Secondary | ICD-10-CM | POA: Diagnosis not present

## 2021-01-24 DIAGNOSIS — R059 Cough, unspecified: Secondary | ICD-10-CM | POA: Diagnosis not present

## 2021-01-24 DIAGNOSIS — J189 Pneumonia, unspecified organism: Secondary | ICD-10-CM | POA: Diagnosis not present

## 2021-02-02 DIAGNOSIS — J320 Chronic maxillary sinusitis: Secondary | ICD-10-CM | POA: Diagnosis not present

## 2021-02-08 DIAGNOSIS — J320 Chronic maxillary sinusitis: Secondary | ICD-10-CM | POA: Diagnosis not present

## 2021-02-09 DIAGNOSIS — F4323 Adjustment disorder with mixed anxiety and depressed mood: Secondary | ICD-10-CM | POA: Diagnosis not present

## 2021-02-16 DIAGNOSIS — J3489 Other specified disorders of nose and nasal sinuses: Secondary | ICD-10-CM | POA: Diagnosis not present

## 2021-03-28 DIAGNOSIS — J320 Chronic maxillary sinusitis: Secondary | ICD-10-CM | POA: Diagnosis not present

## 2021-03-28 DIAGNOSIS — R001 Bradycardia, unspecified: Secondary | ICD-10-CM | POA: Diagnosis not present

## 2021-03-28 DIAGNOSIS — Z79890 Hormone replacement therapy (postmenopausal): Secondary | ICD-10-CM | POA: Diagnosis not present

## 2021-03-28 DIAGNOSIS — Z87891 Personal history of nicotine dependence: Secondary | ICD-10-CM | POA: Diagnosis not present

## 2021-03-28 DIAGNOSIS — D140 Benign neoplasm of middle ear, nasal cavity and accessory sinuses: Secondary | ICD-10-CM | POA: Diagnosis not present

## 2021-03-28 DIAGNOSIS — J3489 Other specified disorders of nose and nasal sinuses: Secondary | ICD-10-CM | POA: Diagnosis not present

## 2021-03-28 DIAGNOSIS — E039 Hypothyroidism, unspecified: Secondary | ICD-10-CM | POA: Diagnosis not present

## 2021-03-28 DIAGNOSIS — Z20822 Contact with and (suspected) exposure to covid-19: Secondary | ICD-10-CM | POA: Diagnosis not present

## 2021-04-06 DIAGNOSIS — J3489 Other specified disorders of nose and nasal sinuses: Secondary | ICD-10-CM | POA: Diagnosis not present

## 2021-04-06 DIAGNOSIS — D140 Benign neoplasm of middle ear, nasal cavity and accessory sinuses: Secondary | ICD-10-CM | POA: Diagnosis not present

## 2021-04-12 DIAGNOSIS — J3489 Other specified disorders of nose and nasal sinuses: Secondary | ICD-10-CM | POA: Diagnosis not present

## 2021-04-12 DIAGNOSIS — D140 Benign neoplasm of middle ear, nasal cavity and accessory sinuses: Secondary | ICD-10-CM | POA: Diagnosis not present

## 2021-04-26 DIAGNOSIS — E611 Iron deficiency: Secondary | ICD-10-CM | POA: Diagnosis not present

## 2021-04-26 DIAGNOSIS — E559 Vitamin D deficiency, unspecified: Secondary | ICD-10-CM | POA: Diagnosis not present

## 2021-04-26 DIAGNOSIS — I10 Essential (primary) hypertension: Secondary | ICD-10-CM | POA: Diagnosis not present

## 2021-04-26 DIAGNOSIS — E039 Hypothyroidism, unspecified: Secondary | ICD-10-CM | POA: Diagnosis not present

## 2021-05-24 DIAGNOSIS — J3489 Other specified disorders of nose and nasal sinuses: Secondary | ICD-10-CM | POA: Diagnosis not present

## 2021-05-24 DIAGNOSIS — D140 Benign neoplasm of middle ear, nasal cavity and accessory sinuses: Secondary | ICD-10-CM | POA: Diagnosis not present

## 2021-05-25 DIAGNOSIS — Z87891 Personal history of nicotine dependence: Secondary | ICD-10-CM | POA: Diagnosis not present

## 2021-05-25 DIAGNOSIS — I10 Essential (primary) hypertension: Secondary | ICD-10-CM | POA: Diagnosis not present

## 2021-05-25 DIAGNOSIS — E669 Obesity, unspecified: Secondary | ICD-10-CM | POA: Diagnosis not present

## 2021-05-25 DIAGNOSIS — E785 Hyperlipidemia, unspecified: Secondary | ICD-10-CM | POA: Diagnosis not present

## 2021-05-25 DIAGNOSIS — J329 Chronic sinusitis, unspecified: Secondary | ICD-10-CM | POA: Diagnosis not present

## 2021-05-25 DIAGNOSIS — E039 Hypothyroidism, unspecified: Secondary | ICD-10-CM | POA: Diagnosis not present

## 2021-05-25 DIAGNOSIS — Z86018 Personal history of other benign neoplasm: Secondary | ICD-10-CM | POA: Diagnosis not present

## 2021-05-25 DIAGNOSIS — Z7982 Long term (current) use of aspirin: Secondary | ICD-10-CM | POA: Diagnosis not present

## 2021-05-25 DIAGNOSIS — K089 Disorder of teeth and supporting structures, unspecified: Secondary | ICD-10-CM | POA: Diagnosis not present

## 2021-05-25 DIAGNOSIS — Z780 Asymptomatic menopausal state: Secondary | ICD-10-CM | POA: Diagnosis not present

## 2021-05-25 DIAGNOSIS — D509 Iron deficiency anemia, unspecified: Secondary | ICD-10-CM | POA: Diagnosis not present

## 2021-05-25 DIAGNOSIS — E611 Iron deficiency: Secondary | ICD-10-CM | POA: Diagnosis not present

## 2021-05-25 DIAGNOSIS — Z6835 Body mass index (BMI) 35.0-35.9, adult: Secondary | ICD-10-CM | POA: Diagnosis not present

## 2021-06-09 DIAGNOSIS — M8589 Other specified disorders of bone density and structure, multiple sites: Secondary | ICD-10-CM | POA: Diagnosis not present

## 2021-08-17 ENCOUNTER — Other Ambulatory Visit: Payer: Self-pay | Admitting: Physician Assistant

## 2021-08-17 DIAGNOSIS — D509 Iron deficiency anemia, unspecified: Secondary | ICD-10-CM

## 2021-08-23 DIAGNOSIS — Z1239 Encounter for other screening for malignant neoplasm of breast: Secondary | ICD-10-CM | POA: Diagnosis not present

## 2021-08-23 DIAGNOSIS — E611 Iron deficiency: Secondary | ICD-10-CM | POA: Diagnosis not present

## 2021-08-23 DIAGNOSIS — I10 Essential (primary) hypertension: Secondary | ICD-10-CM | POA: Diagnosis not present

## 2021-08-23 DIAGNOSIS — E039 Hypothyroidism, unspecified: Secondary | ICD-10-CM | POA: Diagnosis not present

## 2021-08-23 DIAGNOSIS — K5900 Constipation, unspecified: Secondary | ICD-10-CM | POA: Diagnosis not present

## 2021-08-23 DIAGNOSIS — E669 Obesity, unspecified: Secondary | ICD-10-CM | POA: Diagnosis not present

## 2021-10-03 DIAGNOSIS — J320 Chronic maxillary sinusitis: Secondary | ICD-10-CM | POA: Diagnosis not present

## 2021-10-03 DIAGNOSIS — D140 Benign neoplasm of middle ear, nasal cavity and accessory sinuses: Secondary | ICD-10-CM | POA: Diagnosis not present

## 2021-10-26 DIAGNOSIS — J320 Chronic maxillary sinusitis: Secondary | ICD-10-CM | POA: Diagnosis not present

## 2021-12-01 DIAGNOSIS — K5900 Constipation, unspecified: Secondary | ICD-10-CM | POA: Diagnosis not present

## 2021-12-01 DIAGNOSIS — E669 Obesity, unspecified: Secondary | ICD-10-CM | POA: Diagnosis not present

## 2021-12-01 DIAGNOSIS — E039 Hypothyroidism, unspecified: Secondary | ICD-10-CM | POA: Diagnosis not present

## 2021-12-01 DIAGNOSIS — Z1239 Encounter for other screening for malignant neoplasm of breast: Secondary | ICD-10-CM | POA: Diagnosis not present

## 2021-12-01 DIAGNOSIS — I10 Essential (primary) hypertension: Secondary | ICD-10-CM | POA: Diagnosis not present

## 2021-12-01 DIAGNOSIS — Z1159 Encounter for screening for other viral diseases: Secondary | ICD-10-CM | POA: Diagnosis not present

## 2021-12-01 DIAGNOSIS — Z0000 Encounter for general adult medical examination without abnormal findings: Secondary | ICD-10-CM | POA: Diagnosis not present

## 2021-12-01 DIAGNOSIS — Z7189 Other specified counseling: Secondary | ICD-10-CM | POA: Diagnosis not present

## 2021-12-01 DIAGNOSIS — E611 Iron deficiency: Secondary | ICD-10-CM | POA: Diagnosis not present

## 2021-12-06 ENCOUNTER — Encounter (INDEPENDENT_AMBULATORY_CARE_PROVIDER_SITE_OTHER): Payer: Self-pay

## 2022-02-06 ENCOUNTER — Encounter: Payer: Self-pay | Admitting: Physician Assistant

## 2022-02-06 ENCOUNTER — Telehealth: Payer: Self-pay

## 2022-02-06 NOTE — Telephone Encounter (Signed)
Patient called office to get information about when she had her hepatitis and tetanus immunization, patient will be going to [place], please advise, thanks!

## 2022-02-06 NOTE — Telephone Encounter (Signed)
Patient is a resident of [place] and needs to call the Health department of [place] to obtain a record of immunizations.

## 2022-02-22 ENCOUNTER — Encounter: Payer: Self-pay | Admitting: Physician Assistant
